# Patient Record
Sex: Male | Born: 1950
Health system: Southern US, Community
[De-identification: ages and names within clinical notes are randomized; demographics above are authoritative.]

## PROBLEM LIST (undated history)

## (undated) DIAGNOSIS — Z8619 Personal history of other infectious and parasitic diseases: Secondary | ICD-10-CM

## (undated) DIAGNOSIS — M1990 Unspecified osteoarthritis, unspecified site: Secondary | ICD-10-CM

## (undated) DIAGNOSIS — J45909 Unspecified asthma, uncomplicated: Secondary | ICD-10-CM

## (undated) DIAGNOSIS — I10 Essential (primary) hypertension: Secondary | ICD-10-CM

## (undated) HISTORY — PX: BACK SURGERY: SHX140

## (undated) HISTORY — DX: Essential (primary) hypertension: I10

---

## 2000-12-03 ENCOUNTER — Emergency Department (HOSPITAL_COMMUNITY): Admission: EM | Admit: 2000-12-03 | Discharge: 2000-12-03 | Payer: Self-pay | Admitting: Emergency Medicine

## 2000-12-03 ENCOUNTER — Encounter: Payer: Self-pay | Admitting: Emergency Medicine

## 2001-11-22 ENCOUNTER — Emergency Department (HOSPITAL_COMMUNITY): Admission: EM | Admit: 2001-11-22 | Discharge: 2001-11-22 | Payer: Self-pay | Admitting: *Deleted

## 2004-11-03 ENCOUNTER — Emergency Department (HOSPITAL_COMMUNITY): Admission: EM | Admit: 2004-11-03 | Discharge: 2004-11-03 | Payer: Self-pay | Admitting: Emergency Medicine
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
## 2007-08-18 ENCOUNTER — Emergency Department (HOSPITAL_COMMUNITY): Admission: EM | Admit: 2007-08-18 | Discharge: 2007-08-19 | Payer: Self-pay | Admitting: Emergency Medicine

## 2011-09-05 ENCOUNTER — Encounter: Payer: Self-pay | Admitting: Neurology

## 2011-09-06 NOTE — Progress Notes (Signed)
This encounter was created in error - please disregard.

## 2014-12-16 DIAGNOSIS — M5440 Lumbago with sciatica, unspecified side: Secondary | ICD-10-CM | POA: Insufficient documentation

## 2015-04-01 DIAGNOSIS — IMO0001 Reserved for inherently not codable concepts without codable children: Secondary | ICD-10-CM | POA: Insufficient documentation

## 2015-04-01 DIAGNOSIS — M5136 Other intervertebral disc degeneration, lumbar region: Secondary | ICD-10-CM | POA: Insufficient documentation

## 2015-04-01 DIAGNOSIS — M51369 Other intervertebral disc degeneration, lumbar region without mention of lumbar back pain or lower extremity pain: Secondary | ICD-10-CM | POA: Insufficient documentation

## 2015-04-01 DIAGNOSIS — M48061 Spinal stenosis, lumbar region without neurogenic claudication: Secondary | ICD-10-CM | POA: Insufficient documentation

## 2015-08-27 DIAGNOSIS — Z981 Arthrodesis status: Secondary | ICD-10-CM | POA: Insufficient documentation

## 2016-08-30 DIAGNOSIS — I10 Essential (primary) hypertension: Secondary | ICD-10-CM | POA: Insufficient documentation

## 2016-08-30 DIAGNOSIS — N400 Enlarged prostate without lower urinary tract symptoms: Secondary | ICD-10-CM | POA: Insufficient documentation

## 2016-08-30 DIAGNOSIS — K219 Gastro-esophageal reflux disease without esophagitis: Secondary | ICD-10-CM | POA: Insufficient documentation

## 2016-11-07 DIAGNOSIS — M62838 Other muscle spasm: Secondary | ICD-10-CM | POA: Insufficient documentation

## 2017-09-03 DIAGNOSIS — R269 Unspecified abnormalities of gait and mobility: Secondary | ICD-10-CM | POA: Insufficient documentation

## 2017-09-03 DIAGNOSIS — R200 Anesthesia of skin: Secondary | ICD-10-CM | POA: Insufficient documentation

## 2018-09-12 ENCOUNTER — Encounter (INDEPENDENT_AMBULATORY_CARE_PROVIDER_SITE_OTHER): Payer: Self-pay | Admitting: Orthopaedic Surgery

## 2018-09-12 ENCOUNTER — Ambulatory Visit (INDEPENDENT_AMBULATORY_CARE_PROVIDER_SITE_OTHER): Payer: Medicare Other

## 2018-09-12 ENCOUNTER — Ambulatory Visit (INDEPENDENT_AMBULATORY_CARE_PROVIDER_SITE_OTHER): Payer: Medicare Other | Admitting: Orthopaedic Surgery

## 2018-09-12 ENCOUNTER — Ambulatory Visit (INDEPENDENT_AMBULATORY_CARE_PROVIDER_SITE_OTHER): Payer: Self-pay

## 2018-09-12 VITALS — BP 155/95 | HR 65 | Resp 18 | Ht 71.0 in | Wt 290.0 lb

## 2018-09-12 DIAGNOSIS — M25559 Pain in unspecified hip: Secondary | ICD-10-CM

## 2018-09-12 DIAGNOSIS — M25561 Pain in right knee: Secondary | ICD-10-CM

## 2018-09-12 DIAGNOSIS — M25562 Pain in left knee: Secondary | ICD-10-CM

## 2018-09-12 DIAGNOSIS — G8929 Other chronic pain: Secondary | ICD-10-CM | POA: Diagnosis not present

## 2018-09-12 MED ORDER — METHYLPREDNISOLONE ACETATE 40 MG/ML IJ SUSP
80.0000 mg | INTRAMUSCULAR | Status: AC | PRN
  Administered 2018-09-12: 80 mg

## 2018-09-12 MED ORDER — LIDOCAINE HCL 1 % IJ SOLN
2.0000 mL | INTRAMUSCULAR | Status: AC | PRN
  Administered 2018-09-12: 2 mL

## 2018-09-12 MED ORDER — BUPIVACAINE HCL 0.5 % IJ SOLN
2.0000 mL | INTRAMUSCULAR | Status: AC | PRN
  Administered 2018-09-12: 2 mL via INTRA_ARTICULAR

## 2018-09-12 NOTE — Progress Notes (Signed)
Office Visit Note   Patient: Evan Morgan           Date of Birth: Jul 26, 1951           MRN: [id number] Visit Date: 09/12/2018              Requested by: No referring provider defined for this encounter. PCP: Evan Gambler, MD   Assessment & Plan: Visit Diagnoses:  1. Chronic pain of both knees   2. Arthralgia of hip, unspecified laterality     Plan: Significant problem with ambulation which I am not sure is all related to his knees.  Films of his hips were negative.  Mild arthritis in his knees.  I think a lot of the problem might be originating from his back.  He is being followed at [facility].  I will inject the right knee with cortisone and monitor response and have him return in a week.  Think he is going to need further lumbar spine evaluation at [facility]  Follow-Up Instructions: Return in about 1 week (around 09/19/2018).   Orders:  Orders Placed This Encounter  Procedures  . Large Joint Inj: R knee  . XR KNEE 3 VIEW RIGHT  . XR KNEE 3 VIEW LEFT  . XR Pelvis 1-2 Views   No orders of the defined types were placed in this encounter.     Procedures: Large Joint Inj: R knee on 09/12/2018 2:06 PM Indications: pain and diagnostic evaluation Details: 25 G 1.5 in needle, anteromedial approach  Arthrogram: No  Medications: 2 mL lidocaine 1 %; 2 mL bupivacaine 0.5 %; 80 mg methylPREDNISolone acetate 40 MG/ML Procedure, treatment alternatives, risks and benefits explained, specific risks discussed. Consent was given by the patient. Immediately prior to procedure a time out was called to verify the correct patient, procedure, equipment, support staff and site/side marked as required. Patient was prepped and draped in the usual sterile fashion.       Clinical Data: No additional findings.   Subjective: Chief Complaint  Patient presents with  . Right Knee - Pain  . Left Knee - Pain  . Lower Back - Pain  . Knee Pain    Bil knee pain x 1 1/2 years, left hurts  worse than right, stiffness, no injury, no surgery to knees, swelling, difficulty walking, difficulty sleeping at night, left knee locking, Oxycodone helps some  . Back Pain    [facility] back surgery 2016, cannot walk since surgery, right thigh pain, numbness, weakness  [name] is 67 years old and visited the office for a long history of lower extremity pain.  He does use a cane.  He finds it very difficult to ambulate any distance because of pain in his legs.  He has difficulty determining the exact location but does have chronic back pain buttock hip and knee pain.  He also has some chronic swelling of his legs.  His past history is significant that he has had prior back surgery at [facility] with "instrumentation".  He has not been seen in over a year.  He does complain of bilateral knee pain with stiffness, swelling" soreness.  He also has had some chronic swelling of both of his ankles origin".  He is not diabetic.  Does not have any upper extremity joint problems or skin rashes.  HPI  Review of Systems  Constitutional: Negative for fatigue.  HENT: Negative for trouble swallowing.   Eyes: Negative for pain.  Respiratory: Negative for shortness of  breath.   Cardiovascular: Positive for leg swelling.  Gastrointestinal: Negative for constipation.  Endocrine: Positive for heat intolerance.  Genitourinary: Positive for difficulty urinating.  Musculoskeletal: Positive for back pain, gait problem and joint swelling.  Skin: Negative for rash.  Allergic/Immunologic: Positive for food allergies.  Neurological: Positive for weakness and numbness.  Hematological: Does not bruise/bleed easily.  Psychiatric/Behavioral: Positive for sleep disturbance.     Objective: Vital Signs: BP (!) 155/95 (BP Location: Right Arm, Patient Position: Sitting, Cuff Size: Normal)   Pulse 65   Resp 18   Ht 5\' 11"  (1.803 m)   Wt 290 lb (131.5 kg)   BMI 40.45 kg/m   Physical Exam Constitutional:       Appearance: He is well-developed.  Eyes:     Pupils: Pupils are equal, round, and reactive to light.  Pulmonary:     Effort: Pulmonary effort is normal.  Skin:    General: Skin is warm and dry.  Neurological:     Mental Status: He is alert and oriented to person, place, and time.  Psychiatric:        Behavior: Behavior normal.     Ortho Exam ambulates very slowly with the use of a cane.  We actually put him in a wheelchair because he seemed to have a difficult time ambulating to the x-ray room.  He does have bilateral knee effusions and some medial lateral joint pain.  Full extension but flexes only about 95 degrees.  No instability.  Painless range of motion of both hips.  Straight leg raise negative.  Mild pitting edema of both ankles with some venous stasis changes.  Motor exam intact  Specialty Comments:  No specialty comments available.  Imaging: Xr Knee 3 View Left  Result Date: 09/12/2018 Films of the left knee were obtained in 3 projections standing.  Normal alignment.  No ectopic calcification.  Some mild subchondral sclerosis of the lateral femoral condyle may be slight cystic change in the subchondral bone.  No acute change films consistent with mild osteoarthritis  Xr Knee 3 View Right  Result Date: 09/12/2018 Films of the right knee repayment several projections standing.  The joint spaces are well-maintained.  There is about 2 degrees of varus.  No ectopic calcification.  No evidence of a fracture very minimal osteophyte formation about the patellofemoral joint.  The joint spaces well.  No acute changes.  Mild arthritis  Xr Pelvis 1-2 Views  Result Date: 09/12/2018 AP the pelvis did not demonstrate any acute changes.  Hip joints appear to be well-maintained.  No ectopic calcification.  Prior instrumentation be some sclerosis about L5-S1 facet joints on that single AP view.    PMFS History: There are no active problems to display for this patient.  Past Medical  History:  Diagnosis Date  . Hypertension     History reviewed. No pertinent family history.  Past Surgical History:  Procedure Laterality Date  . BACK SURGERY     Social History   Occupational History  . Not on file  Tobacco Use  . Smoking status: Never Smoker  . Smokeless tobacco: Never Used  Substance and Sexual Activity  . Alcohol use: Yes    Alcohol/week: 6.0 standard drinks    Types: 6 Cans of beer per week    Comment: 1/2 PINT LIQUOR WEEKLY  . Drug use: Never  . Sexual activity: Not on file

## 2018-09-20 ENCOUNTER — Ambulatory Visit (INDEPENDENT_AMBULATORY_CARE_PROVIDER_SITE_OTHER): Payer: Medicare Other | Admitting: Orthopaedic Surgery

## 2019-05-09 ENCOUNTER — Other Ambulatory Visit: Payer: Self-pay | Admitting: Orthopedic Surgery

## 2019-05-09 DIAGNOSIS — M545 Low back pain, unspecified: Secondary | ICD-10-CM

## 2019-06-07 ENCOUNTER — Other Ambulatory Visit: Payer: Self-pay

## 2019-06-07 ENCOUNTER — Ambulatory Visit
Admission: RE | Admit: 2019-06-07 | Discharge: 2019-06-07 | Disposition: A | Discharge status: Home / Self Care | Payer: Medicare Other | Source: Ambulatory Visit | Attending: Orthopedic Surgery | Admitting: Orthopedic Surgery

## 2019-06-07 DIAGNOSIS — M545 Low back pain, unspecified: Secondary | ICD-10-CM

## 2019-06-07 IMAGING — MR MR LUMBAR SPINE W/O CM
4 of 5 series · 26 of 48 positions shown · non-contrast
Comparison: None.

CLINICAL DATA: Severe low back pain

EXAM:
MRI LUMBAR SPINE WITHOUT CONTRAST
TECHNIQUE: Multiplanar, multisequence MR imaging of the lumbar spine was
performed. No intravenous contrast was administered.

[Series 3: T2 post-contrast · sagittal · 4.0mm · 0.55mm/px · 6 of 15 slices shown]
[im 1/15]
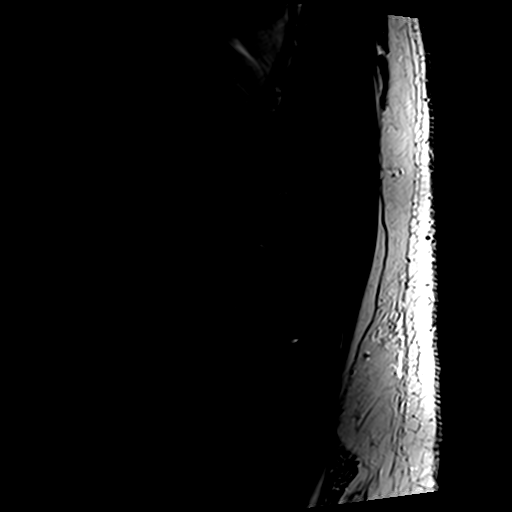
[im 3/15]
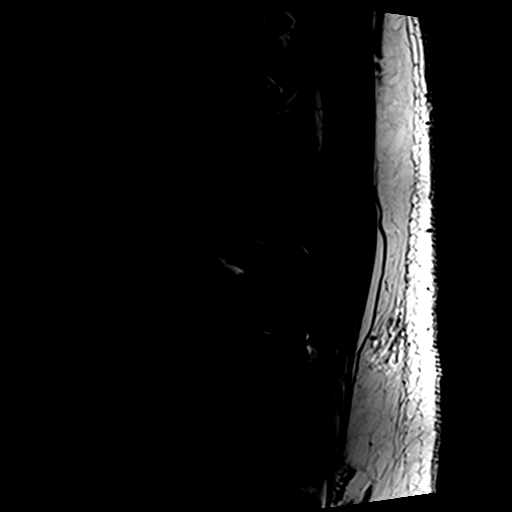
[im 6/15]
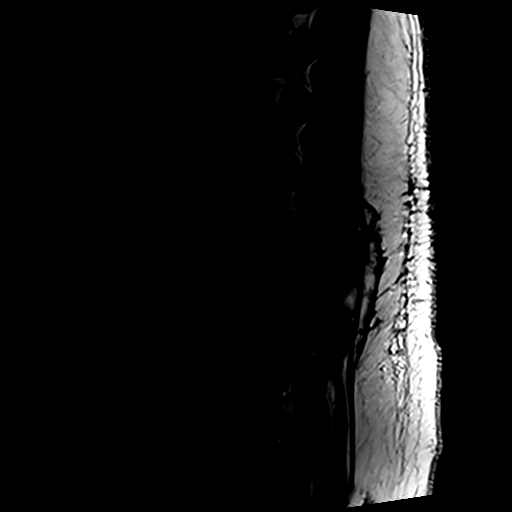
[im 9/15]
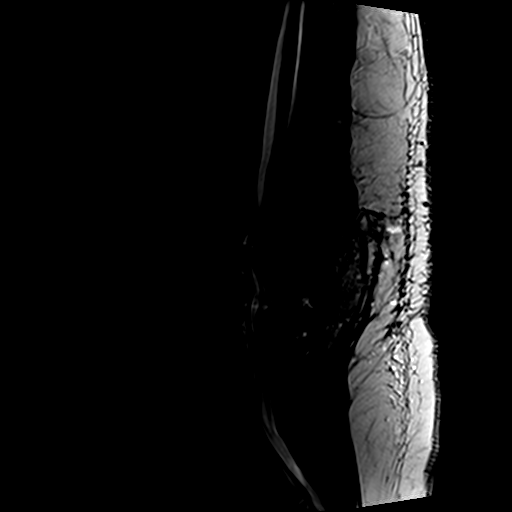
[im 12/15]
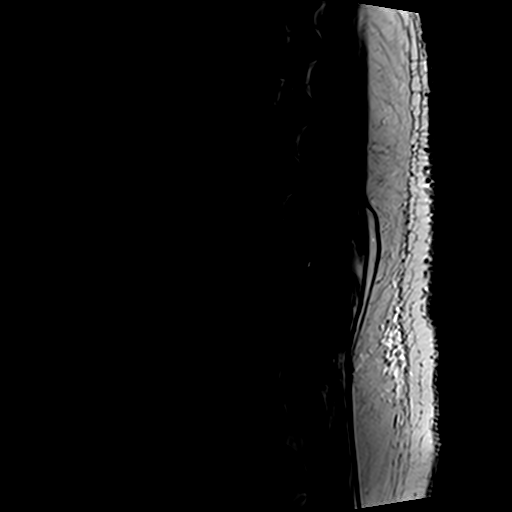
[im 15/15]
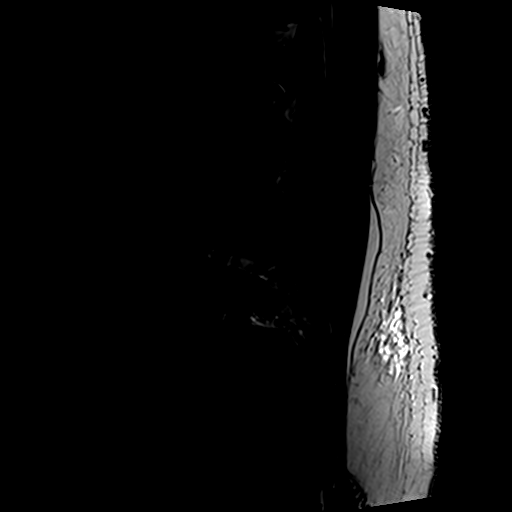

[Series 5: T1 · sagittal · 4.0mm · 0.55mm/px · 5 of 15 slices shown (1 of 2)]
[im 1/15]
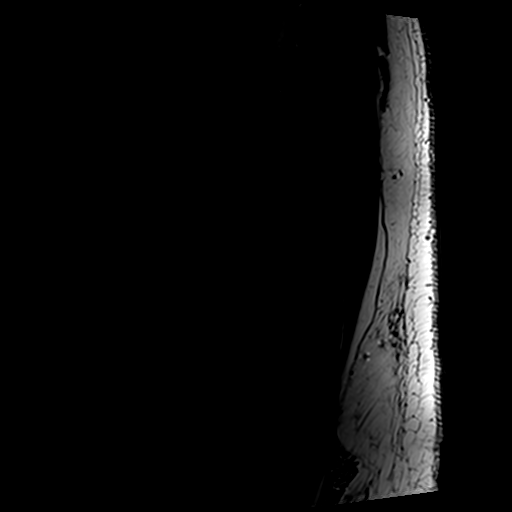
[im 4/15]
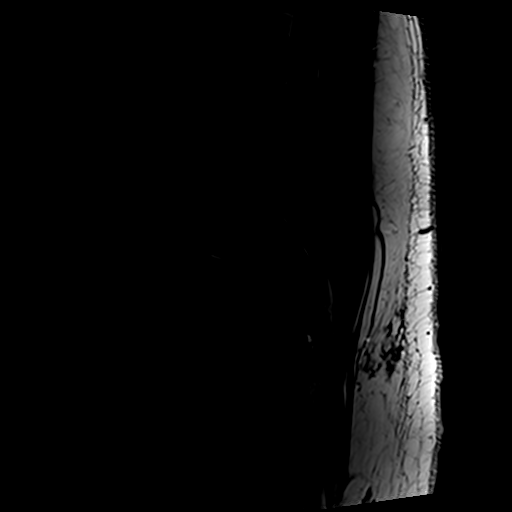
[im 8/15]
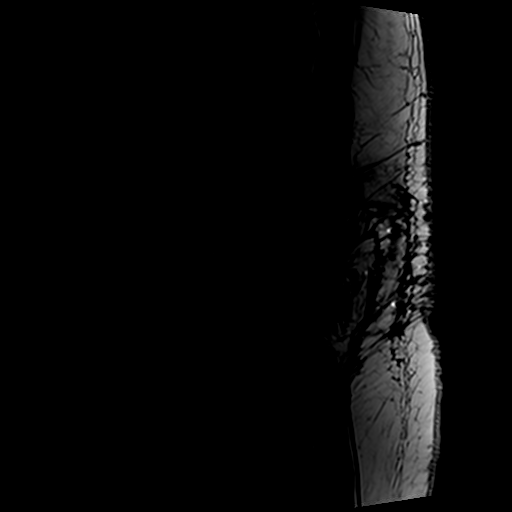
[im 11/15]
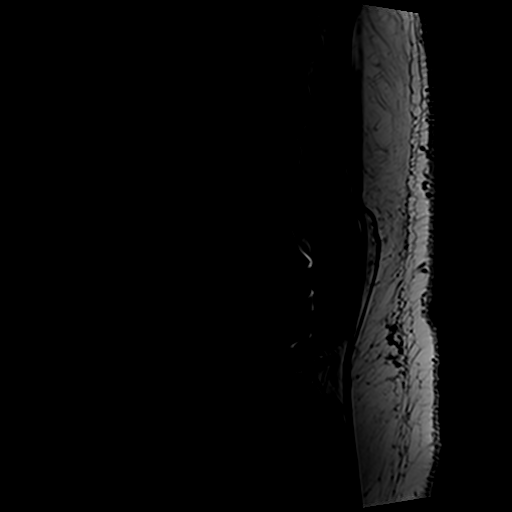
[im 15/15]
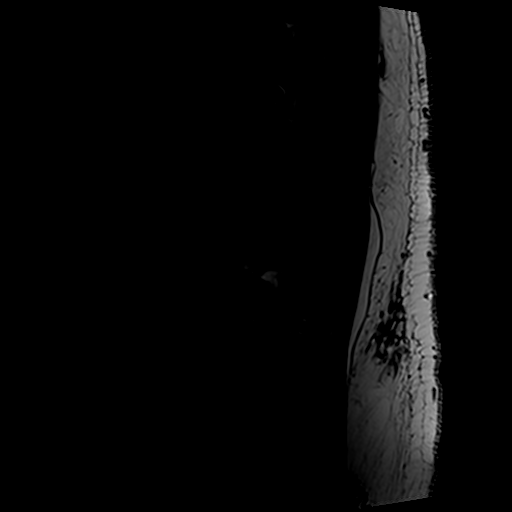

[Series 7: T2 · axial · 4.0mm · 0.70mm/px · z∈[-97,+128]mm · 10 of 46 slices shown]
[im 4/46]
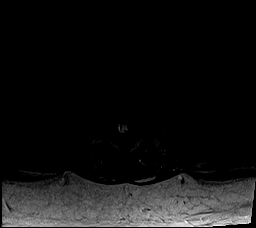
[im 7/46]
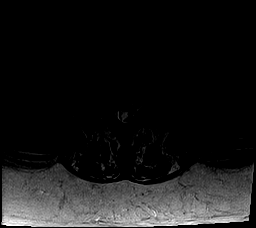
[im 10/46]
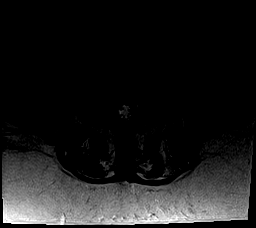
[im 16/46]
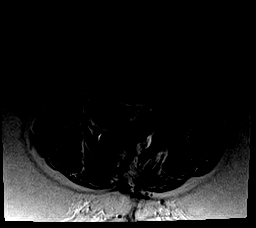
[im 22/46]
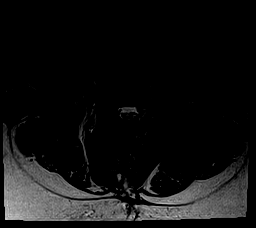
[im 25/46]
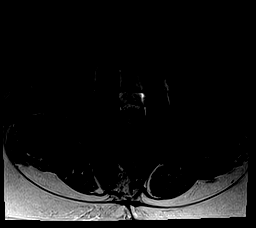
[im 28/46]
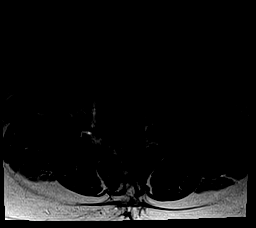
[im 34/46]
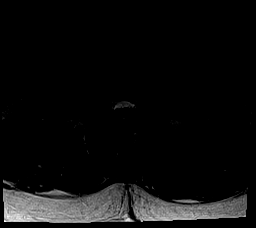
[im 40/46]
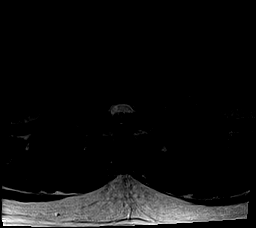
[im 46/46]
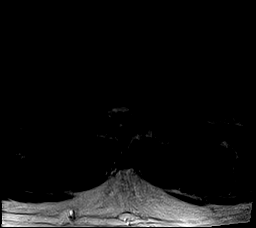

[Series 8: T1 · axial · 4.0mm · 0.35mm/px · z∈[-97,+97]mm · 5 of 46 slices shown (2 of 2)]
[im 4/46]
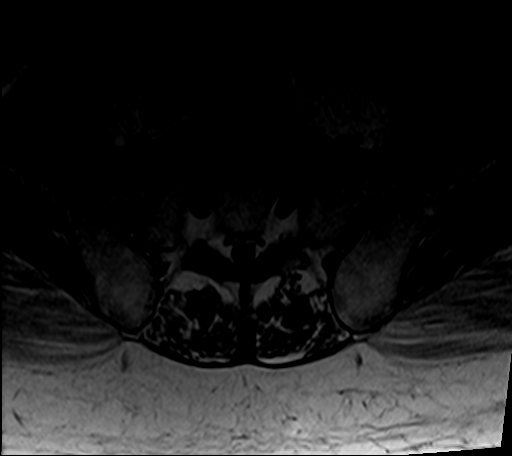
[im 7/46]
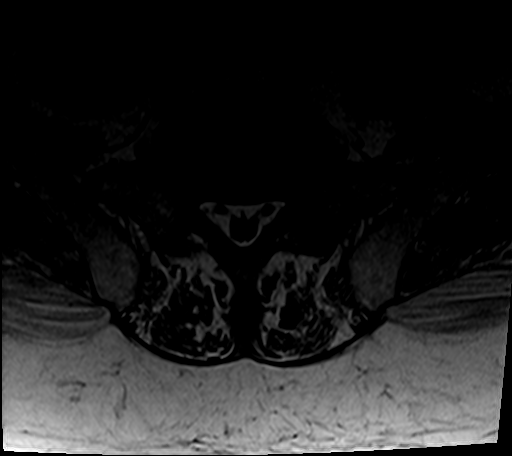
[im 10/46]
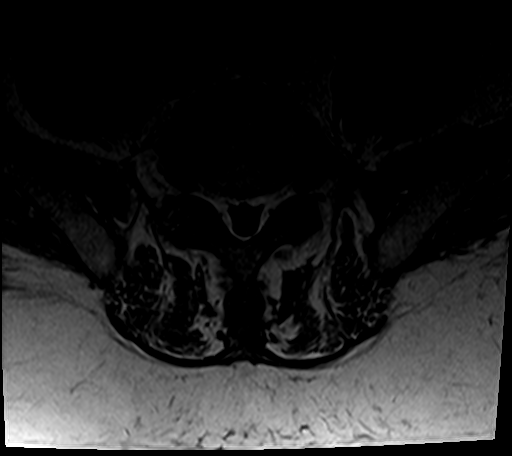
[im 25/46]
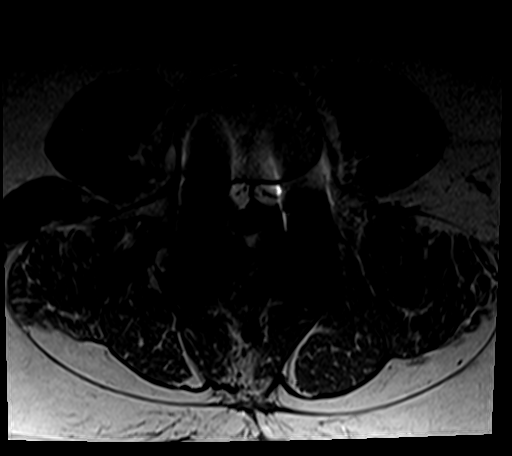
[im 40/46]
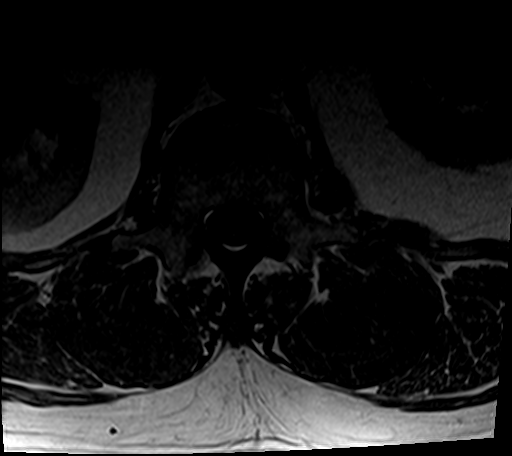

[26 of 48 positions shown; findings below may reference images not displayed]

FINDINGS: Segmentation:  Standard.

Alignment:  Grade 1 anterolisthesis L3 on L4.

Vertebrae: Susceptibility artifact from posterior fusion of L3-4
slightly degrades evaluation of the adjacent structures. No
fracture, evidence of discitis, or bone lesion.

Conus medullaris and cauda equina: Conus extends to the L1 level.
Conus and cauda equina appear normal.

Paraspinal and other soft tissues: Negative.

Disc levels:

T12-L1: No significant disc protrusion, foraminal stenosis, or canal
stenosis.

L1-L2: Minimal diffuse disc bulge and mild bilateral facet
arthrosis. No foraminal or canal stenosis.

L2-L3: Minimal diffuse disc bulge, bilateral facet arthrosis, and
ligamentum flavum buckling result in mild right foraminal stenosis.
No canal stenosis.

L3-L4: Status post fusion and posterior decompression. Extensive
susceptibility artifact at this level limits evaluation. Disc
uncovering related to grade 1 anterolisthesis. No residual canal
stenosis. There is suggestion of at least mild right foraminal
stenosis, poorly evaluated. No high-grade left foraminal stenosis.

L4-L5: Mild diffuse disc bulge with bilateral facet arthrosis result
in mild bilateral foraminal stenosis and mild canal stenosis.

L5-S1: Minimal diffuse disc bulge and mild bilateral facet arthrosis
resulting in mild bilateral foraminal stenosis. No canal stenosis.
IMPRESSION: 1. Postsurgical changes from posterior fusion and decompression at
L3-4. No canal stenosis at this level. There is at least mild right
foraminal stenosis, poorly evaluated by extensive metallic
susceptibility artifact.
2. Mild multilevel lumbar spondylosis of the non-fused levels
including mild canal and bilateral foraminal stenosis at the L4-5
level.

## 2019-06-10 ENCOUNTER — Other Ambulatory Visit: Payer: Self-pay

## 2019-06-12 ENCOUNTER — Other Ambulatory Visit: Payer: Self-pay

## 2019-07-02 ENCOUNTER — Other Ambulatory Visit: Payer: Self-pay | Admitting: Orthopedic Surgery

## 2019-07-18 NOTE — Progress Notes (Signed)
[facility] - [place] - [address] [place] [address] Phone: [phone] Fax: [phone]      Your procedure is scheduled on July 23, 2019.  Report to [facility] Main Entrance "A" at 6:30 A.M., and check in at the Admitting office.   Call this number if you have problems the morning of surgery:  [phone]  Call [phone] if you have any questions prior to your surgery date Monday-Friday 8am-4pm    Remember:  Do not eat after midnight the night before your surgery  You may drink clear liquids until 5:30AM the morning of your surgery.   Clear liquids allowed are: Water, Non-Citrus Juices (without pulp), Carbonated Beverages, Clear Tea, Black Coffee Only, and Gatorade    Please complete your PRE-SURGERY ENSURE that was provided to you by 5:30AM the morning of surgery.  Please, if able, drink it in one setting. DO NOT SIP.   Take these medicines the morning of surgery with A SIP OF WATER:  Acetaminophen (Tylenol) - if needed Atenolol (Tenormin) Finasteride (Proscar) Gabapentin (Neurontin) Hydralazine (Apresoline) Oxycodone (Percocet) - if needed Tamsulosin (Flomax)  7 days prior to surgery STOP taking any Aspirin (unless otherwise instructed by your surgeon), Aleve, Naproxen, Ibuprofen, Motrin, Advil, Goody's, BC's, all herbal medications, fish oil, and all vitamins.    The Morning of Surgery  Do not wear jewelry, make-up or nail polish.  Do not wear lotions, powders, or perfumes/colognes, or deodorant  Do not shave 48 hours prior to surgery.  Men may shave face and neck.  Do not bring valuables to the hospital.  [facility] is not responsible for any belongings or valuables.  If you are a smoker, DO NOT Smoke 24 hours prior to surgery IF you wear a CPAP at night please bring your mask, tubing, and machine the morning of surgery   Remember that you must have someone to transport you home after your surgery, and  remain with you for 24 hours if you are discharged the same day.   Contacts, glasses, hearing aids, dentures or bridgework may not be worn into surgery.    Leave your suitcase in the car.  After surgery it may be brought to your room.  For patients admitted to the hospital, discharge time will be determined by your treatment team.  Patients discharged the day of surgery will not be allowed to drive home.    Special instructions:   [place]- Preparing For Surgery  Before surgery, you can play an important role. Because skin is not sterile, your skin needs to be as free of germs as possible. You can reduce the number of germs on your skin by washing with CHG (chlorahexidine gluconate) Soap before surgery.  CHG is an antiseptic cleaner which kills germs and bonds with the skin to continue killing germs even after washing.    Oral Hygiene is also important to reduce your risk of infection.  Remember - BRUSH YOUR TEETH THE MORNING OF SURGERY WITH YOUR REGULAR TOOTHPASTE  Please do not use if you have an allergy to CHG or antibacterial soaps. If your skin becomes reddened/irritated stop using the CHG.  Do not shave (including legs and underarms) for at least 48 hours prior to first CHG shower. It is OK to shave your face.  Please follow these instructions carefully.   1. Shower the NIGHT BEFORE SURGERY and the MORNING OF SURGERY with CHG Soap.   2. If you chose to wash your hair,  wash your hair first as usual with your normal shampoo.  3. After you shampoo, rinse your hair and body thoroughly to remove the shampoo.  4. Use CHG as you would any other liquid soap. You can apply CHG directly to the skin and wash gently with a scrungie or a clean washcloth.   5. Apply the CHG Soap to your body ONLY FROM THE NECK DOWN.  Do not use on open wounds or open sores. Avoid contact with your eyes, ears, mouth and genitals (private parts). Wash Face and genitals (private parts)  with your normal  soap.   6. Wash thoroughly, paying special attention to the area where your surgery will be performed.  7. Thoroughly rinse your body with warm water from the neck down.  8. DO NOT shower/wash with your normal soap after using and rinsing off the CHG Soap.  9. Pat yourself dry with a CLEAN TOWEL.  10. Wear CLEAN PAJAMAS to bed the night before surgery, wear comfortable clothes the morning of surgery  11. Place CLEAN SHEETS on your bed the night of your first shower and DO NOT SLEEP WITH PETS.    Day of Surgery:  Do not apply any deodorants/lotions. Please shower the morning of surgery with the CHG soap  Please wear clean clothes to the hospital/surgery center.   Remember to brush your teeth WITH YOUR REGULAR TOOTHPASTE.   Please read over the following fact sheets that you were given.

## 2019-07-21 ENCOUNTER — Encounter (HOSPITAL_COMMUNITY): Payer: Self-pay

## 2019-07-21 ENCOUNTER — Encounter (HOSPITAL_COMMUNITY)
Admission: RE | Admit: 2019-07-21 | Discharge: 2019-07-21 | Disposition: A | Discharge status: Home / Self Care | Payer: Medicare Other | Source: Ambulatory Visit | Attending: Orthopedic Surgery | Admitting: Orthopedic Surgery

## 2019-07-21 ENCOUNTER — Other Ambulatory Visit: Payer: Self-pay

## 2019-07-21 ENCOUNTER — Inpatient Hospital Stay (HOSPITAL_COMMUNITY): Admission: RE | Admit: 2019-07-21 | Payer: Medicare Other | Source: Ambulatory Visit

## 2019-07-21 DIAGNOSIS — Z01818 Encounter for other preprocedural examination: Secondary | ICD-10-CM | POA: Insufficient documentation

## 2019-07-21 DIAGNOSIS — I10 Essential (primary) hypertension: Secondary | ICD-10-CM | POA: Insufficient documentation

## 2019-07-21 HISTORY — DX: Unspecified asthma, uncomplicated: J45.909

## 2019-07-21 HISTORY — DX: Personal history of other infectious and parasitic diseases: Z86.19

## 2019-07-21 LAB — URINALYSIS, ROUTINE W REFLEX MICROSCOPIC
Bilirubin Urine: NEGATIVE
Glucose, UA: NEGATIVE mg/dL
Hgb urine dipstick: NEGATIVE
Ketones, ur: NEGATIVE mg/dL
Leukocytes,Ua: NEGATIVE
Nitrite: NEGATIVE
Protein, ur: NEGATIVE mg/dL
Specific Gravity, Urine: 1.018 (ref 1.005–1.030)
pH: 5 (ref 5.0–8.0)

## 2019-07-21 LAB — TYPE AND SCREEN
ABO/RH(D): A POS
Antibody Screen: NEGATIVE

## 2019-07-21 LAB — COMPREHENSIVE METABOLIC PANEL
ALT: 17 U/L (ref 0–44)
AST: 23 U/L (ref 15–41)
Albumin: 3.7 g/dL (ref 3.5–5.0)
Alkaline Phosphatase: 30 U/L — ABNORMAL LOW (ref 38–126)
Anion gap: 9 (ref 5–15)
BUN: 13 mg/dL (ref 8–23)
CO2: 26 mmol/L (ref 22–32)
Calcium: 9.3 mg/dL (ref 8.9–10.3)
Chloride: 99 mmol/L (ref 98–111)
Creatinine, Ser: 1.15 mg/dL (ref 0.61–1.24)
GFR calc Af Amer: >60 mL/min (ref >60)
GFR calc non Af Amer: >60 mL/min (ref >60)
Glucose, Bld: 122 mg/dL — ABNORMAL HIGH (ref 70–99)
Potassium: 3.5 mmol/L (ref 3.5–5.1)
Sodium: 134 mmol/L — ABNORMAL LOW (ref 135–145)
Total Bilirubin: 0.9 mg/dL (ref 0.3–1.2)
Total Protein: 7.5 g/dL (ref 6.5–8.1)

## 2019-07-21 LAB — CBC WITH DIFFERENTIAL/PLATELET
Abs Immature Granulocytes: 0.01 10*3/uL (ref 0.00–0.07)
Basophils Absolute: 0 10*3/uL (ref 0.0–0.1)
Basophils Relative: 1 %
Eosinophils Absolute: 0.1 10*3/uL (ref 0.0–0.5)
Eosinophils Relative: 3 %
HCT: 45.8 % (ref 39.0–52.0)
Hemoglobin: 15.8 g/dL (ref 13.0–17.0)
Immature Granulocytes: 0 %
Lymphocytes Relative: 52 %
Lymphs Abs: 2 10*3/uL (ref 0.7–4.0)
MCH: 32.1 pg (ref 26.0–34.0)
MCHC: 34.5 g/dL (ref 30.0–36.0)
MCV: 93.1 fL (ref 80.0–100.0)
Monocytes Absolute: 0.5 10*3/uL (ref 0.1–1.0)
Monocytes Relative: 12 %
Neutro Abs: 1.2 10*3/uL — ABNORMAL LOW (ref 1.7–7.7)
Neutrophils Relative %: 32 %
Platelets: 97 10*3/uL — ABNORMAL LOW (ref 150–400)
RBC: 4.92 MIL/uL (ref 4.22–5.81)
RDW: 12.8 % (ref 11.5–15.5)
WBC: 3.8 10*3/uL — ABNORMAL LOW (ref 4.0–10.5)
nRBC: 0 % (ref 0.0–0.2)

## 2019-07-21 LAB — ABO/RH: ABO/RH(D): A POS

## 2019-07-21 LAB — SURGICAL PCR SCREEN
MRSA, PCR: NEGATIVE
Staphylococcus aureus: POSITIVE — AB

## 2019-07-21 LAB — PROTIME-INR
INR: 1.1 (ref 0.8–1.2)
Prothrombin Time: 14.4 seconds (ref 11.4–15.2)

## 2019-07-21 LAB — APTT: aPTT: 37 seconds — ABNORMAL HIGH (ref 24–36)

## 2019-07-21 NOTE — Progress Notes (Signed)
[facility] - [place] - [address] [place] [address] Phone: [phone] Fax: [phone]      Your procedure is scheduled on July 23, 2019.  Report to [facility] Main Entrance "A" at 6:30 A.M., and check in at the Admitting office.   Call this number if you have problems the morning of surgery:  [phone]  Call [phone] if you have any questions prior to your surgery date Monday-Friday 8am-4pm    Remember:  Do not eat after midnight the night before your surgery  You may drink clear liquids until 5:30AM the morning of your surgery.   Clear liquids allowed are: Water, Non-Citrus Juices (without pulp), Carbonated Beverages, Clear Tea, Black Coffee Only, and Gatorade     Enhanced Recovery after Surgery for Orthopedics Enhanced Recovery after Surgery is a protocol used to improve the stress on your body and your recovery after surgery.  Patient Instructions  . The night before surgery:  o No food after midnight. ONLY clear liquids after midnight  .  Marland Kitchen The day of surgery (if you do NOT have diabetes):  o Drink ONE (1) Pre-Surgery Clear Ensure as directed.   o This drink was given to you during your hospital  pre-op appointment visit. o The pre-op nurse will instruct you on the time to drink the  Pre-Surgery Ensure depending on your surgery time. o Finish the drink at the designated time by the pre-op nurse.  o Nothing else to drink after completing the  Pre-Surgery Clear Ensure.          If you have questions, please contact your surgeon's office.    Take these medicines the morning of surgery with A SIP OF WATER:  Acetaminophen (Tylenol) - if needed Atenolol (Tenormin) Finasteride (Proscar) Gabapentin (Neurontin) Hydralazine (Apresoline) Oxycodone (Percocet) - if needed Tamsulosin (Flomax)  7 days prior to surgery STOP taking any Aspirin (unless otherwise instructed by your surgeon), Aleve, Naproxen,  Ibuprofen, Motrin, Advil, Goody's, BC's, all herbal medications, fish oil, and all vitamins.     The Morning of Surgery  Do not wear jewelry, make-up or nail polish.  Do not wear lotions, powders, or perfumes/colognes, or deodorant  Do not shave 48 hours prior to surgery.  Men may shave face and neck.  Do not bring valuables to the hospital.  [facility] is not responsible for any belongings or valuables.  If you are a smoker, DO NOT Smoke 24 hours prior to surgery IF you wear a CPAP at night please bring your mask, tubing, and machine the morning of surgery   Remember that you must have someone to transport you home after your surgery, and remain with you for 24 hours if you are discharged the same day.   Contacts, glasses, hearing aids, dentures or bridgework may not be worn into surgery.    Leave your suitcase in the car.  After surgery it may be brought to your room.  For patients admitted to the hospital, discharge time will be determined by your treatment team.  Patients discharged the day of surgery will not be allowed to drive home.    Special instructions:   [place]- Preparing For Surgery  Before surgery, you can play an important role. Because skin is not sterile, your skin needs to be as free of germs as possible. You can reduce the number of germs on your skin by washing with CHG (chlorahexidine gluconate) Soap before surgery.  CHG is an antiseptic  cleaner which kills germs and bonds with the skin to continue killing germs even after washing.    Oral Hygiene is also important to reduce your risk of infection.  Remember - BRUSH YOUR TEETH THE MORNING OF SURGERY WITH YOUR REGULAR TOOTHPASTE  Please do not use if you have an allergy to CHG or antibacterial soaps. If your skin becomes reddened/irritated stop using the CHG.  Do not shave (including legs and underarms) for at least 48 hours prior to first CHG shower. It is OK to shave your face.  Please follow these  instructions carefully.   1. Shower the NIGHT BEFORE SURGERY and the MORNING OF SURGERY with CHG Soap.   2. If you chose to wash your hair, wash your hair first as usual with your normal shampoo.  3. After you shampoo, rinse your hair and body thoroughly to remove the shampoo.  4. Use CHG as you would any other liquid soap. You can apply CHG directly to the skin and wash gently with a scrungie or a clean washcloth.   5. Apply the CHG Soap to your body ONLY FROM THE NECK DOWN.  Do not use on open wounds or open sores. Avoid contact with your eyes, ears, mouth and genitals (private parts). Wash Face and genitals (private parts)  with your normal soap.   6. Wash thoroughly, paying special attention to the area where your surgery will be performed.  7. Thoroughly rinse your body with warm water from the neck down.  8. DO NOT shower/wash with your normal soap after using and rinsing off the CHG Soap.  9. Pat yourself dry with a CLEAN TOWEL.  10. Wear CLEAN PAJAMAS to bed the night before surgery, wear comfortable clothes the morning of surgery  11. Place CLEAN SHEETS on your bed the night of your first shower and DO NOT SLEEP WITH PETS.    Day of Surgery:  Do not apply any deodorants/lotions. Please shower the morning of surgery with the CHG soap  Please wear clean clothes to the hospital/surgery center.   Remember to brush your teeth WITH YOUR REGULAR TOOTHPASTE.   Please read over the following fact sheets that you were given.

## 2019-07-21 NOTE — Progress Notes (Signed)
Denies chest pain or shob. Denies having a cardiologist. Reports that the [place] in [place] him scheduled for some type of cardiac test "just to check on things." Release form signed and requested records from [place]. Will send to anesthesia for review.

## 2019-07-22 ENCOUNTER — Other Ambulatory Visit (HOSPITAL_COMMUNITY)
Admission: RE | Admit: 2019-07-22 | Discharge: 2019-07-22 | Disposition: A | Discharge status: Home / Self Care | Payer: Medicare Other | Source: Ambulatory Visit | Attending: Orthopedic Surgery | Admitting: Orthopedic Surgery

## 2019-07-22 LAB — SARS CORONAVIRUS 2 (TAT 6-24 HRS): SARS Coronavirus 2: NEGATIVE

## 2019-07-22 MED ORDER — DEXTROSE 5 % IV SOLN
3.0000 g | INTRAVENOUS | Status: AC
  Administered 2019-07-23: 3 g via INTRAVENOUS
  Filled 2019-07-22: qty 3

## 2019-07-22 NOTE — Progress Notes (Signed)
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  He is also scheduled for L2-4 posterior spinal fusion revision on 07/24/2019.  History includes never smoker, HTN, childhood asthma, Hepatitis C (s/p treatment). S/p L3-4 transforaminal lumbar interbody fusion 07/21/15. S/p L3-4 redo decompression and fusion with posterolateral fusion and revision of hardware 09/06/16.  I contacted [facility] Release of Information around 9:30 AM this morning for follow-up records request faxed on 07/21/19 with signed Release of Information. Last visit with primary care was in June 2020. No cardiology notes. No pending tests seen per medical records staff. Last ECG there was from 2011. Staff to fax records, but not received as of 3:30 PM. I could not get an answer when I tried to contact them again. I was able to get speak with Dr. Garner Nash nurse Apolinar Junes. He noted last visit 03/07/19 was for ED follow-up for edema without associated SOB or chest pain. (I was able to view 03/02/19 ED note in [facility] Care Everywhere). LE Duplex negative for DVT, and.BNP WNL. Patient was more concerned that swelling was related to his previous back surgeries and chronic BLE shooting pains. Since without overt HF symptoms and already on HCTZ, ED referred him back to PCP.  Dr. Jess Barters recommended he continue compression stockings, but also ordered TTE (which is not scheduled into November 2020). Continued Neurosurgery follow-up for chronic back pain.   Reviewed case with anesthesiologist Arta Bruce, MD. Patient with chronic edema ,denied SOB or chest  pain. Normal BNP in June. No diabetes or known CAD history. If no concerning CV/HF symptoms then would anticipate he can proceed with surgery as scheduled, but definitive plan per assigned anesthesiologist following day of surgery evaluation. I did call and speak with Mr. Odoherty. He confirms he is without chest pain and SOB. Says LE is stable--says some days legs don't seem swollen at all, but others feet will be swollen but not so severe that he has issues with shoes fitting. He is using compression stockings which seem to help, as well as, elevation. He is on HCTZ. No orthopnea. He admits that he is not particularly active for the past six month due to his back/leg pain, but prior to that did not feel activity was limited, although did walk with a cane.     PLT count 97K and PTT 37 which were called to Lupita Leash at Dr. Marshell Levan office. He has had mild thrombocytopenia in the past (~ 120-130K)--he does have a history of hepatitis C, but no liver imaging reports currently available. From an anesthesia standpoint, labs appear acceptable. COVID-19 test is in process.     VS: BP 118/63   Pulse 64   Temp (!) 36.3 C   Resp 20   Ht 5\' 11"  (1.803 m)   SpO2 97%   BMI 40.45 kg/m   PROVIDERS: , MD is PCP [facility], [phone], ext. [phone])   LABS: Preoperative labs noted. Cr 1.15. AST/ALT and PT/INR WNL. PTT 37. PLT count 97K (). WBC 3.8. Glucose 122. T&S done.  (all labs ordered are listed, but only abnormal results are displayed)  Labs Reviewed  SURGICAL PCR SCREEN - Abnormal; Notable for the following components:      Result Value   Staphylococcus aureus POSITIVE (*)    All other components within normal limits  APTT - Abnormal; Notable for the following components:   aPTT 37 (*)    All other components within normal limits  CBC WITH DIFFERENTIAL/PLATELET - Abnormal; Notable for the following components:   WBC 3.8 (*)    Platelets 97 (*)    Neutro Abs 1.2 (*)     All other components within normal limits  COMPREHENSIVE METABOLIC PANEL - Abnormal; Notable for the following components:   Sodium 134 (*)    Glucose, Bld 122 (*)    Alkaline Phosphatase 30 (*)    All other components within normal limits  PROTIME-INR  URINALYSIS, ROUTINE W REFLEX MICROSCOPIC  TYPE AND SCREEN  ABO/RH     IMAGES: MRI L-spine 06/07/19: IMPRESSION: 1. Postsurgical changes from posterior fusion and decompression at L3-4. No canal stenosis at this level. There is at least mild right foraminal stenosis, poorly evaluated by extensive metallic susceptibility artifact. 2. Mild multilevel lumbar spondylosis of the non-fused levels including mild canal and bilateral foraminal stenosis at the L4-5 level.  CT L-spine 04/08/19 [facility]): 1. No evidence of acute fracture or traumatic malalignment involving the lumbar spine. 2. Redemonstrated L3-L4 PLIF. No solid osseous bridging across the L3-L4 disc space. Mild lucency surrounding the left L3 pedicle screw. No interval hardware complications. 3. Advanced right greater than left foraminal stenosis at L3-4. Can consider MRI to further evaluate nerve roots if clinically indicated. 4. Degenerative changes resulting in mild to moderate canal stenosis at L2-L3, and moderate to advanced canal stenosis at L4-L5 do not appear significantly changed relative to prior studies.   EKG: 07/21/19: Normal sinus rhythm Inferior infarct , age undetermined Abnormal ECG - Currently, no comparison EKG available. 2011 EKG requested from [facility], but not yet received.    CV: BLE venous Duplex 03/03/19 [facility]): No evidence of deep vein thrombosis. The peroneal vein was not visualized on the right. Calf veins were not visualized on the left.    Past Medical History:  Diagnosis Date  . Asthma    As a child  . History of hepatitis C   . Hypertension     Past Surgical History:  Procedure Laterality Date  . BACK SURGERY       MEDICATIONS: . acetaminophen (TYLENOL) 500 MG tablet  . atenolol (TENORMIN) 50 MG tablet  . Cholecalciferol (VITAMIN D-1000 MAX ST) 25 MCG (1000 UT) tablet  . finasteride (PROSCAR) 5 MG tablet  . gabapentin (NEURONTIN) 300 MG capsule  . hydrALAZINE (APRESOLINE) 10 MG tablet  . hydrochlorothiazide (HYDRODIURIL) 25 MG tablet  . lidocaine (LMX) 4 % cream  . oxyCODONE-acetaminophen (PERCOCET) 10-325 MG tablet  . tamsulosin (FLOMAX) 0.4 MG CAPS capsule   No current facility-administered medications for this encounter.     Myra Gianotti, PA-C Surgical Short Stay/Anesthesiology [facility] Phone [phone] [facility] Phone [phone] 07/22/2019 4:00 PM

## 2019-07-22 NOTE — Anesthesia Preprocedure Evaluation (Addendum)
Anesthesia Evaluation  Patient identified by MRN, date of birth, ID band Patient awake    Reviewed: Allergy & Precautions, NPO status , Patient's Chart, lab work & pertinent test results  Airway Mallampati: II  TM Distance: >3 FB     Dental   Pulmonary    breath sounds clear to auscultation       Cardiovascular hypertension,  Rhythm:Regular Rate:Normal     Neuro/Psych    GI/Hepatic negative GI ROS, (+) Hepatitis -  Endo/Other  negative endocrine ROS  Renal/GU negative Renal ROS     Musculoskeletal   Abdominal   Peds  Hematology   Anesthesia Other Findings   Reproductive/Obstetrics                           Anesthesia Physical Anesthesia Plan  ASA: III  Anesthesia Plan: General   Post-op Pain Management:    Induction: Intravenous  PONV Risk Score and Plan: 2 and Ondansetron, Dexamethasone and Midazolam  Airway Management Planned: Oral ETT  Additional Equipment:   Intra-op Plan:   Post-operative Plan: Possible Post-op intubation/ventilation  Informed Consent:     Dental advisory given  Plan Discussed with: Anesthesiologist and CRNA  Anesthesia Plan Comments: (See PAT note written 07/22/2019 by Myra Gianotti, PA-C. )      Anesthesia Quick Evaluation

## 2019-07-23 ENCOUNTER — Inpatient Hospital Stay (HOSPITAL_COMMUNITY): Payer: Medicare Other | Admitting: Vascular Surgery

## 2019-07-23 ENCOUNTER — Other Ambulatory Visit: Payer: Self-pay

## 2019-07-23 ENCOUNTER — Inpatient Hospital Stay (HOSPITAL_COMMUNITY): Payer: Medicare Other | Admitting: Anesthesiology

## 2019-07-23 ENCOUNTER — Inpatient Hospital Stay (HOSPITAL_COMMUNITY): Payer: Medicare Other

## 2019-07-23 ENCOUNTER — Encounter (HOSPITAL_COMMUNITY)
Admission: RE | Disposition: A | Discharge status: Home / Self Care | Payer: Self-pay | Source: Home / Self Care | Attending: Orthopedic Surgery

## 2019-07-23 ENCOUNTER — Inpatient Hospital Stay (HOSPITAL_COMMUNITY)
Admission: RE | Admit: 2019-07-23 | Discharge: 2019-07-25 | DRG: 454 | Disposition: A | Discharge status: Home Health | Payer: Medicare Other | Attending: Orthopedic Surgery | Admitting: Orthopedic Surgery

## 2019-07-23 ENCOUNTER — Encounter (HOSPITAL_COMMUNITY): Payer: Self-pay | Admitting: *Deleted

## 2019-07-23 DIAGNOSIS — Y834 Other reconstructive surgery as the cause of abnormal reaction of the patient, or of later complication, without mention of misadventure at the time of the procedure: Secondary | ICD-10-CM | POA: Diagnosis present

## 2019-07-23 DIAGNOSIS — Z885 Allergy status to narcotic agent status: Secondary | ICD-10-CM

## 2019-07-23 DIAGNOSIS — T84226A Displacement of internal fixation device of vertebrae, initial encounter: Secondary | ICD-10-CM | POA: Diagnosis present

## 2019-07-23 DIAGNOSIS — M48061 Spinal stenosis, lumbar region without neurogenic claudication: Secondary | ICD-10-CM | POA: Diagnosis present

## 2019-07-23 DIAGNOSIS — Y793 Surgical instruments, materials and orthopedic devices (including sutures) associated with adverse incidents: Secondary | ICD-10-CM | POA: Diagnosis present

## 2019-07-23 DIAGNOSIS — Z419 Encounter for procedure for purposes other than remedying health state, unspecified: Secondary | ICD-10-CM

## 2019-07-23 DIAGNOSIS — M4316 Spondylolisthesis, lumbar region: Secondary | ICD-10-CM | POA: Diagnosis present

## 2019-07-23 DIAGNOSIS — Z8619 Personal history of other infectious and parasitic diseases: Secondary | ICD-10-CM

## 2019-07-23 DIAGNOSIS — M5416 Radiculopathy, lumbar region: Secondary | ICD-10-CM | POA: Diagnosis present

## 2019-07-23 DIAGNOSIS — J45909 Unspecified asthma, uncomplicated: Secondary | ICD-10-CM | POA: Diagnosis present

## 2019-07-23 DIAGNOSIS — M960 Pseudarthrosis after fusion or arthrodesis: Secondary | ICD-10-CM | POA: Diagnosis present

## 2019-07-23 DIAGNOSIS — M5410 Radiculopathy, site unspecified: Secondary | ICD-10-CM | POA: Diagnosis present

## 2019-07-23 DIAGNOSIS — Z20828 Contact with and (suspected) exposure to other viral communicable diseases: Secondary | ICD-10-CM | POA: Diagnosis present

## 2019-07-23 DIAGNOSIS — Z888 Allergy status to other drugs, medicaments and biological substances status: Secondary | ICD-10-CM

## 2019-07-23 DIAGNOSIS — I10 Essential (primary) hypertension: Secondary | ICD-10-CM | POA: Diagnosis present

## 2019-07-23 HISTORY — PX: ANTERIOR LAT LUMBAR FUSION: SHX1168

## 2019-07-23 IMAGING — RF DG C-ARM 1-60 MIN
1 series · 2 of 2 positions shown · non-contrast
Comparison: MRI lumbar spine [DATE]

CLINICAL DATA: L2-3 XLIF.

EXAM:
LUMBAR SPINE - 2-3 VIEW; DG C-ARM 1-60 MIN

[Series 1: run · 2 of 2 slices shown]
[im 1/2]
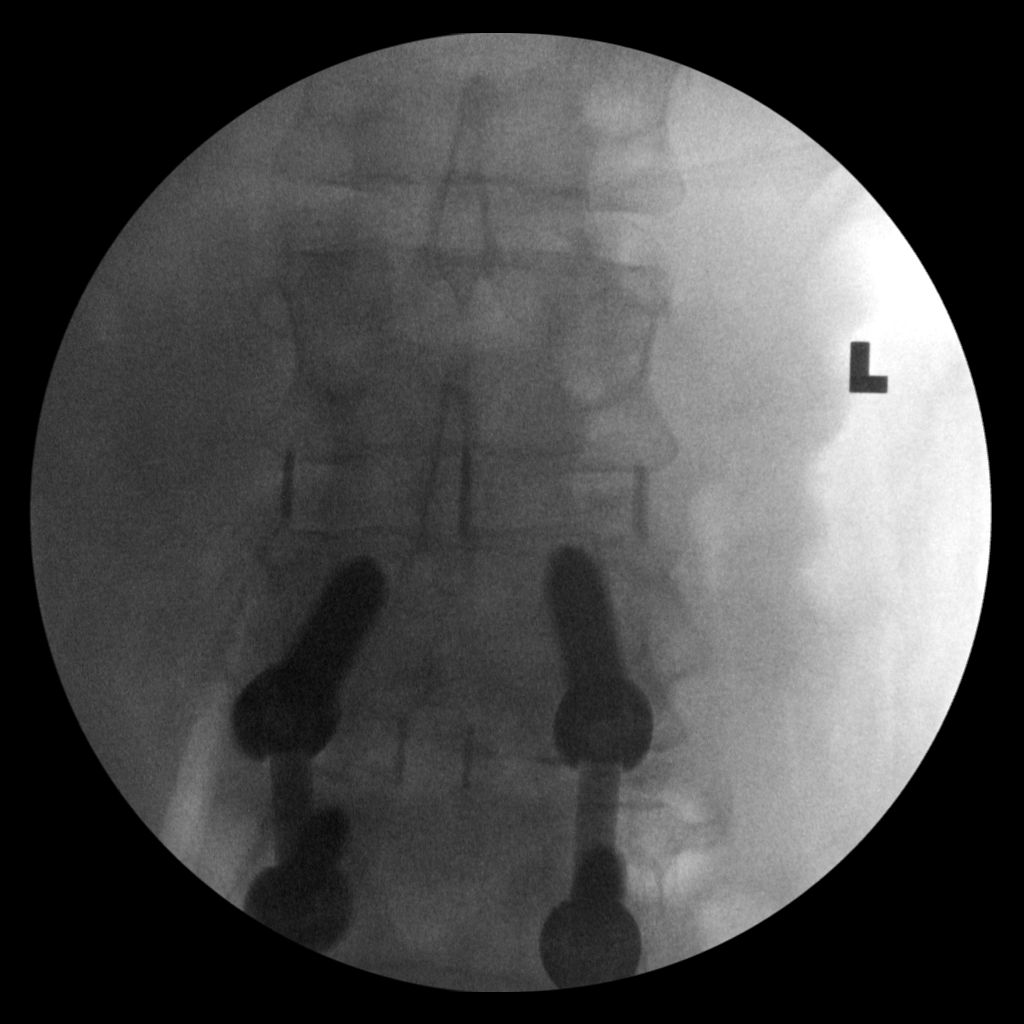
[im 2/2]
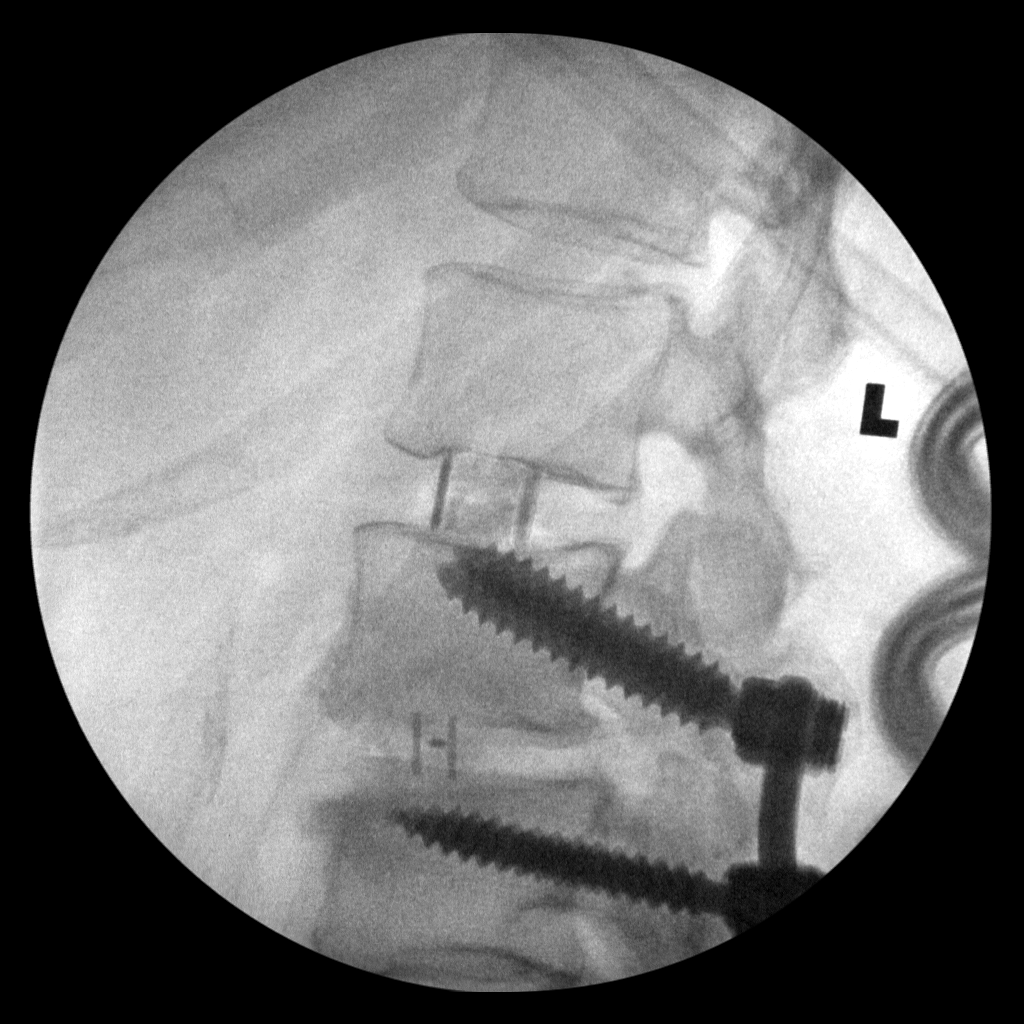

[2 of 2 positions shown; findings below may reference images not displayed]

FINDINGS: Previous lumbar fusion at L3-4 is stable.

Knee discectomy and spacer is present at the L2-3 level. Alignment
is anatomic. Disc height is improved.
IMPRESSION: Interval discectomy at L2-3 without radiographic evidence for
complication.

## 2019-07-23 IMAGING — RF DG LUMBAR SPINE 2-3V
1 series · 2 of 2 positions shown · non-contrast
Comparison: MRI lumbar spine [DATE]

CLINICAL DATA: L2-3 XLIF.

EXAM:
LUMBAR SPINE - 2-3 VIEW; DG C-ARM 1-60 MIN

[Series 1: run · 2 of 2 slices shown]
[im 1/2]
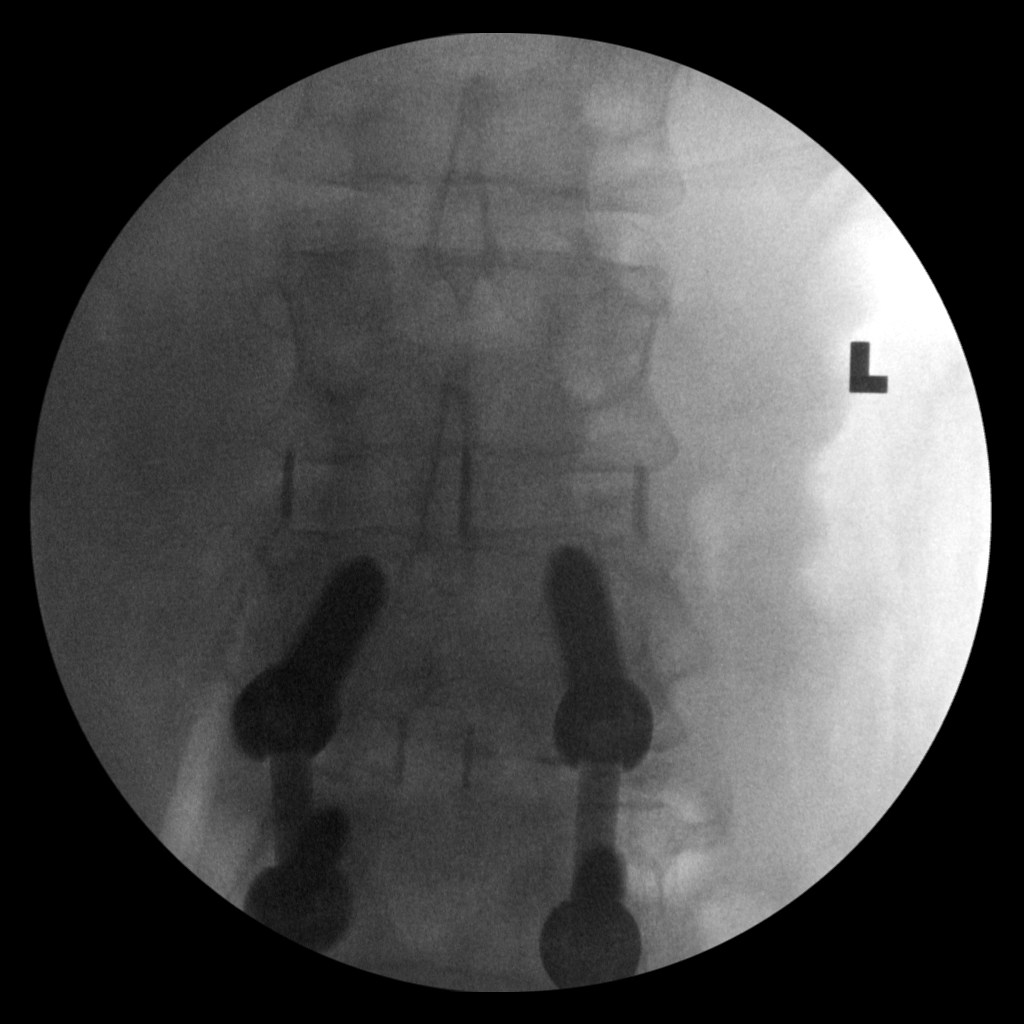
[im 2/2]
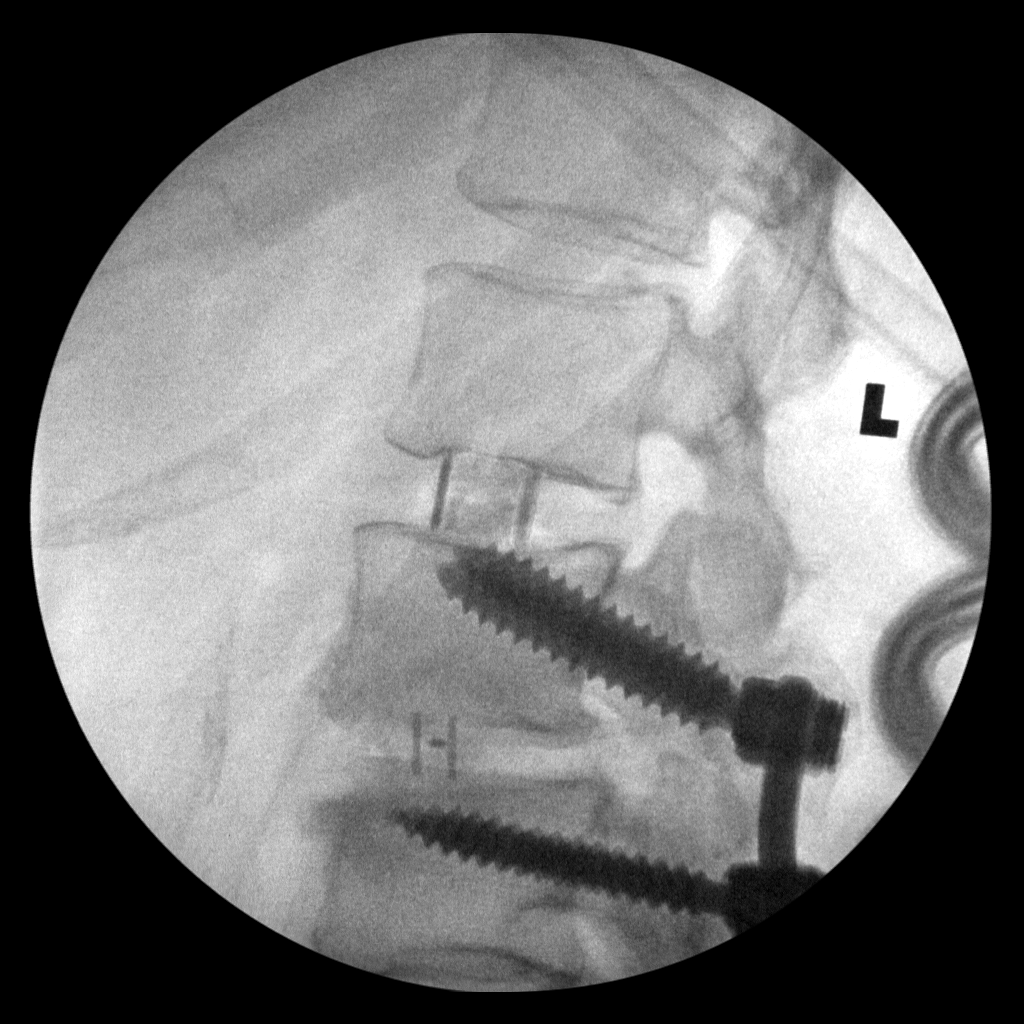

[2 of 2 positions shown; findings below may reference images not displayed]

FINDINGS: Previous lumbar fusion at L3-4 is stable.

Knee discectomy and spacer is present at the L2-3 level. Alignment
is anatomic. Disc height is improved.
IMPRESSION: Interval discectomy at L2-3 without radiographic evidence for
complication.

## 2019-07-23 SURGERY — ANTERIOR LATERAL LUMBAR FUSION 1 LEVEL
Anesthesia: General | Laterality: Left

## 2019-07-23 MED ORDER — DOCUSATE SODIUM 100 MG PO CAPS
100.0000 mg | ORAL_CAPSULE | Freq: Two times a day (BID) | ORAL | Status: DC
  Administered 2019-07-23 – 2019-07-25 (×3): 100 mg via ORAL
  Filled 2019-07-23 (×3): qty 1

## 2019-07-23 MED ORDER — DIAZEPAM 5 MG PO TABS
ORAL_TABLET | ORAL | Status: AC
  Filled 2019-07-23: qty 1

## 2019-07-23 MED ORDER — GABAPENTIN 300 MG PO CAPS
900.0000 mg | ORAL_CAPSULE | Freq: Two times a day (BID) | ORAL | Status: DC
  Administered 2019-07-23 – 2019-07-25 (×4): 900 mg via ORAL
  Filled 2019-07-23 (×5): qty 3

## 2019-07-23 MED ORDER — METHYLENE BLUE 0.5 % INJ SOLN
INTRAVENOUS | Status: AC
  Filled 2019-07-23: qty 10

## 2019-07-23 MED ORDER — SENNOSIDES-DOCUSATE SODIUM 8.6-50 MG PO TABS: 1.0000 | ORAL_TABLET | Freq: Every evening | ORAL | Status: DC | PRN

## 2019-07-23 MED ORDER — BISACODYL 5 MG PO TBEC: 5.0000 mg | DELAYED_RELEASE_TABLET | Freq: Every day | ORAL | Status: DC | PRN

## 2019-07-23 MED ORDER — MENTHOL 3 MG MT LOZG: 1.0000 | LOZENGE | OROMUCOSAL | Status: DC | PRN

## 2019-07-23 MED ORDER — PROPOFOL 10 MG/ML IV BOLUS
INTRAVENOUS | Status: AC
  Filled 2019-07-23: qty 20

## 2019-07-23 MED ORDER — FINASTERIDE 5 MG PO TABS
5.0000 mg | ORAL_TABLET | Freq: Every day | ORAL | Status: DC
  Administered 2019-07-24 – 2019-07-25 (×2): 5 mg via ORAL
  Filled 2019-07-23 (×2): qty 1

## 2019-07-23 MED ORDER — THROMBIN 20000 UNITS EX SOLR
CUTANEOUS | Status: DC | PRN
  Administered 2019-07-23: 10:00:00 20000 [IU] via TOPICAL

## 2019-07-23 MED ORDER — 0.9 % SODIUM CHLORIDE (POUR BTL) OPTIME
TOPICAL | Status: DC | PRN
  Administered 2019-07-23 (×2): 1000 mL

## 2019-07-23 MED ORDER — SODIUM CHLORIDE 0.9 % IV SOLN
INTRAVENOUS | Status: DC | PRN
  Administered 2019-07-23: 25 ug/min via INTRAVENOUS

## 2019-07-23 MED ORDER — DEXAMETHASONE SODIUM PHOSPHATE 10 MG/ML IJ SOLN
INTRAMUSCULAR | Status: AC
  Filled 2019-07-23: qty 1

## 2019-07-23 MED ORDER — FENTANYL CITRATE (PF) 100 MCG/2ML IJ SOLN
INTRAMUSCULAR | Status: AC
  Filled 2019-07-23: qty 2

## 2019-07-23 MED ORDER — ROCURONIUM BROMIDE 10 MG/ML (PF) SYRINGE
PREFILLED_SYRINGE | INTRAVENOUS | Status: AC
  Filled 2019-07-23: qty 10

## 2019-07-23 MED ORDER — MIDAZOLAM HCL 2 MG/2ML IJ SOLN
INTRAMUSCULAR | Status: DC | PRN
  Administered 2019-07-23: 2 mg via INTRAVENOUS

## 2019-07-23 MED ORDER — ONDANSETRON HCL 4 MG PO TABS: 4.0000 mg | ORAL_TABLET | Freq: Four times a day (QID) | ORAL | Status: DC | PRN

## 2019-07-23 MED ORDER — POVIDONE-IODINE 7.5 % EX SOLN
Freq: Once | CUTANEOUS | Status: DC
  Filled 2019-07-23: qty 118

## 2019-07-23 MED ORDER — POTASSIUM CHLORIDE IN NACL 20-0.9 MEQ/L-% IV SOLN
INTRAVENOUS | Status: DC
  Filled 2019-07-23: qty 1000

## 2019-07-23 MED ORDER — LACTATED RINGERS IV SOLN
INTRAVENOUS | Status: DC | PRN
  Administered 2019-07-23: 08:00:00 via INTRAVENOUS

## 2019-07-23 MED ORDER — OXYCODONE-ACETAMINOPHEN 5-325 MG PO TABS
1.0000 | ORAL_TABLET | ORAL | Status: DC | PRN
  Administered 2019-07-23 – 2019-07-25 (×8): 2 via ORAL
  Filled 2019-07-23 (×8): qty 2

## 2019-07-23 MED ORDER — SODIUM CHLORIDE 0.9 % IV SOLN: 250.0000 mL | INTRAVENOUS | Status: DC

## 2019-07-23 MED ORDER — CEFAZOLIN SODIUM-DEXTROSE 2-4 GM/100ML-% IV SOLN
2.0000 g | Freq: Three times a day (TID) | INTRAVENOUS | Status: AC
  Administered 2019-07-23 (×2): 2 g via INTRAVENOUS
  Filled 2019-07-23 (×2): qty 100

## 2019-07-23 MED ORDER — VITAMIN D 25 MCG (1000 UNIT) PO TABS
2000.0000 [IU] | ORAL_TABLET | Freq: Every day | ORAL | Status: DC
  Administered 2019-07-25: 2000 [IU] via ORAL
  Filled 2019-07-23: qty 2

## 2019-07-23 MED ORDER — PROPOFOL 500 MG/50ML IV EMUL
INTRAVENOUS | Status: DC | PRN
  Administered 2019-07-23: 50 ug/kg/min via INTRAVENOUS

## 2019-07-23 MED ORDER — ONDANSETRON HCL 4 MG/2ML IJ SOLN
INTRAMUSCULAR | Status: AC
  Filled 2019-07-23: qty 2

## 2019-07-23 MED ORDER — DIAZEPAM 5 MG PO TABS
5.0000 mg | ORAL_TABLET | Freq: Four times a day (QID) | ORAL | Status: DC | PRN
  Administered 2019-07-23 – 2019-07-25 (×4): 5 mg via ORAL
  Filled 2019-07-23 (×3): qty 1

## 2019-07-23 MED ORDER — DEXAMETHASONE SODIUM PHOSPHATE 10 MG/ML IJ SOLN
INTRAMUSCULAR | Status: DC | PRN
  Administered 2019-07-23: 10 mg via INTRAVENOUS

## 2019-07-23 MED ORDER — SUCCINYLCHOLINE CHLORIDE 200 MG/10ML IV SOSY
PREFILLED_SYRINGE | INTRAVENOUS | Status: AC
  Filled 2019-07-23: qty 10

## 2019-07-23 MED ORDER — BUPIVACAINE-EPINEPHRINE (PF) 0.25% -1:200000 IJ SOLN
INTRAMUSCULAR | Status: AC
  Filled 2019-07-23: qty 30

## 2019-07-23 MED ORDER — MORPHINE SULFATE (PF) 2 MG/ML IV SOLN
1.0000 mg | INTRAVENOUS | Status: DC | PRN
  Administered 2019-07-24: 2 mg via INTRAVENOUS
  Filled 2019-07-23: qty 1

## 2019-07-23 MED ORDER — SODIUM CHLORIDE 0.9% FLUSH
3.0000 mL | Freq: Two times a day (BID) | INTRAVENOUS | Status: DC
  Administered 2019-07-23 – 2019-07-24 (×2): 3 mL via INTRAVENOUS

## 2019-07-23 MED ORDER — FLEET ENEMA 7-19 GM/118ML RE ENEM: 1.0000 | ENEMA | Freq: Once | RECTAL | Status: DC | PRN

## 2019-07-23 MED ORDER — PROPOFOL 10 MG/ML IV BOLUS
INTRAVENOUS | Status: DC | PRN
  Administered 2019-07-23: 150 mg via INTRAVENOUS

## 2019-07-23 MED ORDER — EPHEDRINE SULFATE-NACL 50-0.9 MG/10ML-% IV SOSY
PREFILLED_SYRINGE | INTRAVENOUS | Status: DC | PRN
  Administered 2019-07-23: 10 mg via INTRAVENOUS

## 2019-07-23 MED ORDER — TAMSULOSIN HCL 0.4 MG PO CAPS
0.4000 mg | ORAL_CAPSULE | Freq: Every day | ORAL | Status: DC
  Administered 2019-07-23 – 2019-07-25 (×3): 0.4 mg via ORAL
  Filled 2019-07-23 (×3): qty 1

## 2019-07-23 MED ORDER — HEMOSTATIC AGENTS (NO CHARGE) OPTIME
TOPICAL | Status: DC | PRN
  Administered 2019-07-23: 1 via TOPICAL

## 2019-07-23 MED ORDER — FENTANYL CITRATE (PF) 250 MCG/5ML IJ SOLN
INTRAMUSCULAR | Status: AC
  Filled 2019-07-23: qty 5

## 2019-07-23 MED ORDER — PHENOL 1.4 % MT LIQD: 1.0000 | OROMUCOSAL | Status: DC | PRN

## 2019-07-23 MED ORDER — ZOLPIDEM TARTRATE 5 MG PO TABS: 5.0000 mg | ORAL_TABLET | Freq: Every evening | ORAL | Status: DC | PRN

## 2019-07-23 MED ORDER — ACETAMINOPHEN 650 MG RE SUPP: 650.0000 mg | RECTAL | Status: DC | PRN

## 2019-07-23 MED ORDER — BUPIVACAINE-EPINEPHRINE 0.25% -1:200000 IJ SOLN
INTRAMUSCULAR | Status: DC | PRN
  Administered 2019-07-23: 5 mL

## 2019-07-23 MED ORDER — LIDOCAINE 2% (20 MG/ML) 5 ML SYRINGE
INTRAMUSCULAR | Status: AC
  Filled 2019-07-23: qty 5

## 2019-07-23 MED ORDER — HYDROCHLOROTHIAZIDE 25 MG PO TABS
25.0000 mg | ORAL_TABLET | Freq: Every day | ORAL | Status: DC
  Administered 2019-07-24 – 2019-07-25 (×2): 25 mg via ORAL
  Filled 2019-07-23 (×2): qty 1

## 2019-07-23 MED ORDER — SUCCINYLCHOLINE CHLORIDE 20 MG/ML IJ SOLN
INTRAMUSCULAR | Status: DC | PRN
  Administered 2019-07-23: 100 mg via INTRAVENOUS

## 2019-07-23 MED ORDER — DIAZEPAM 5 MG PO TABS: 5.0000 mg | ORAL_TABLET | Freq: Four times a day (QID) | ORAL | 0 refills | Status: DC | PRN

## 2019-07-23 MED ORDER — HYDRALAZINE HCL 10 MG PO TABS
10.0000 mg | ORAL_TABLET | Freq: Two times a day (BID) | ORAL | Status: DC
  Administered 2019-07-23 – 2019-07-25 (×4): 10 mg via ORAL
  Filled 2019-07-23 (×4): qty 1

## 2019-07-23 MED ORDER — DEXTROSE 5 % IV SOLN
3.0000 g | INTRAVENOUS | Status: DC
  Filled 2019-07-23: qty 3000

## 2019-07-23 MED ORDER — PROMETHAZINE HCL 25 MG PO TABS: 12.5000 mg | ORAL_TABLET | Freq: Four times a day (QID) | ORAL | Status: DC | PRN

## 2019-07-23 MED ORDER — PHENYLEPHRINE 40 MCG/ML (10ML) SYRINGE FOR IV PUSH (FOR BLOOD PRESSURE SUPPORT)
PREFILLED_SYRINGE | INTRAVENOUS | Status: DC | PRN
  Administered 2019-07-23 (×2): 120 ug via INTRAVENOUS

## 2019-07-23 MED ORDER — LACTATED RINGERS IV SOLN
INTRAVENOUS | Status: DC | PRN
  Administered 2019-07-23: 09:00:00 via INTRAVENOUS

## 2019-07-23 MED ORDER — STERILE WATER FOR IRRIGATION IR SOLN
Status: DC | PRN
  Administered 2019-07-23: 1000 mL

## 2019-07-23 MED ORDER — ONDANSETRON HCL 4 MG/2ML IJ SOLN
INTRAMUSCULAR | Status: DC | PRN
  Administered 2019-07-23: 4 mg via INTRAVENOUS

## 2019-07-23 MED ORDER — ALUM & MAG HYDROXIDE-SIMETH 200-200-20 MG/5ML PO SUSP: 30.0000 mL | Freq: Four times a day (QID) | ORAL | Status: DC | PRN

## 2019-07-23 MED ORDER — ONDANSETRON HCL 4 MG/2ML IJ SOLN: 4.0000 mg | Freq: Four times a day (QID) | INTRAMUSCULAR | Status: DC | PRN

## 2019-07-23 MED ORDER — BUPIVACAINE LIPOSOME 1.3 % IJ SUSP
20.0000 mL | INTRAMUSCULAR | Status: DC
  Filled 2019-07-23: qty 20

## 2019-07-23 MED ORDER — FENTANYL CITRATE (PF) 100 MCG/2ML IJ SOLN
INTRAMUSCULAR | Status: DC | PRN
  Administered 2019-07-23: 100 ug via INTRAVENOUS

## 2019-07-23 MED ORDER — MIDAZOLAM HCL 2 MG/2ML IJ SOLN
INTRAMUSCULAR | Status: AC
  Filled 2019-07-23: qty 2

## 2019-07-23 MED ORDER — OXYCODONE-ACETAMINOPHEN 5-325 MG PO TABS: 1.0000 | ORAL_TABLET | ORAL | 0 refills | Status: DC | PRN

## 2019-07-23 MED ORDER — SODIUM CHLORIDE 0.9% FLUSH: 3.0000 mL | INTRAVENOUS | Status: DC | PRN

## 2019-07-23 MED ORDER — ATENOLOL 50 MG PO TABS
50.0000 mg | ORAL_TABLET | Freq: Every day | ORAL | Status: DC
  Administered 2019-07-24 – 2019-07-25 (×2): 50 mg via ORAL
  Filled 2019-07-23 (×2): qty 1

## 2019-07-23 MED ORDER — LIDOCAINE 2% (20 MG/ML) 5 ML SYRINGE
INTRAMUSCULAR | Status: DC | PRN
  Administered 2019-07-23: 80 mg via INTRAVENOUS

## 2019-07-23 MED ORDER — THROMBIN (RECOMBINANT) 20000 UNITS EX SOLR
CUTANEOUS | Status: AC
  Filled 2019-07-23: qty 20000

## 2019-07-23 MED ORDER — ACETAMINOPHEN 325 MG PO TABS: 650.0000 mg | ORAL_TABLET | ORAL | Status: DC | PRN

## 2019-07-23 MED ORDER — FENTANYL CITRATE (PF) 100 MCG/2ML IJ SOLN
25.0000 ug | INTRAMUSCULAR | Status: DC | PRN
  Administered 2019-07-23 (×2): 50 ug via INTRAVENOUS

## 2019-07-23 SURGICAL SUPPLY — 93 items
AGENT HMST KT MTR STRL THRMB (HEMOSTASIS)
APL SKNCLS STERI-STRIP NONHPOA (GAUZE/BANDAGES/DRESSINGS) ×2
BENZOIN TINCTURE PRP APPL 2/3 (GAUZE/BANDAGES/DRESSINGS) ×4 IMPLANT
BLADE CLIPPER SURG (BLADE) IMPLANT
BONE VIVIGEN FORMABLE 10CC (Bone Implant) ×4 IMPLANT
BUR PRESCISION 1.7 ELITE (BURR) ×4 IMPLANT
BUR ROUND FLUTED 5 RND (BURR) ×3 IMPLANT
BUR ROUND FLUTED 5MM RND (BURR) ×1
BUR ROUND PRECISION 4.0 (BURR) IMPLANT
BUR ROUND PRECISION 4.0MM (BURR)
BUR SABER RD CUTTING 3.0 (BURR) IMPLANT
BUR SABER RD CUTTING 3.0MM (BURR)
CARTRIDGE OIL MAESTRO DRILL (MISCELLANEOUS) ×2 IMPLANT
CLOSURE STERI-STRIP 1/4X4 (GAUZE/BANDAGES/DRESSINGS) ×2 IMPLANT
CLOSURE WOUND 1/2 X4 (GAUZE/BANDAGES/DRESSINGS) ×2
CONT SPEC 4OZ CLIKSEAL STRL BL (MISCELLANEOUS) ×4 IMPLANT
CORD BIPOLAR FORCEPS 12FT (ELECTRODE) ×2 IMPLANT
COVER BACK TABLE 60X90IN (DRAPES) ×4 IMPLANT
COVER MAYO STAND STRL (DRAPES) ×8 IMPLANT
COVER SURGICAL LIGHT HANDLE (MISCELLANEOUS) ×4 IMPLANT
COVER WAND RF STERILE (DRAPES) ×4 IMPLANT
DIFFUSER DRILL AIR PNEUMATIC (MISCELLANEOUS) ×4 IMPLANT
DRAIN CHANNEL 15F RND FF W/TCR (WOUND CARE) IMPLANT
DRAPE C-ARM 42X72 X-RAY (DRAPES) ×4 IMPLANT
DRAPE C-ARMOR (DRAPES) IMPLANT
DRAPE POUCH INSTRU U-SHP 10X18 (DRAPES) ×4 IMPLANT
DRAPE SURG 17X23 STRL (DRAPES) ×16 IMPLANT
DURAPREP 26ML APPLICATOR (WOUND CARE) ×4 IMPLANT
ELECT BLADE 4.0 EZ CLEAN MEGAD (MISCELLANEOUS) ×4
ELECT CAUTERY BLADE 6.4 (BLADE) ×4 IMPLANT
ELECT REM PT RETURN 9FT ADLT (ELECTROSURGICAL) ×4
ELECTRODE BLDE 4.0 EZ CLN MEGD (MISCELLANEOUS) ×2 IMPLANT
ELECTRODE REM PT RTRN 9FT ADLT (ELECTROSURGICAL) ×2 IMPLANT
EVACUATOR SILICONE 100CC (DRAIN) IMPLANT
FILTER STRAW FLUID ASPIR (MISCELLANEOUS) ×4 IMPLANT
GAUZE 4X4 16PLY RFD (DISPOSABLE) ×4 IMPLANT
GAUZE SPONGE 4X4 12PLY STRL (GAUZE/BANDAGES/DRESSINGS) ×4 IMPLANT
GLOVE BIO SURGEON STRL SZ7 (GLOVE) ×4 IMPLANT
GLOVE BIO SURGEON STRL SZ8 (GLOVE) ×4 IMPLANT
GLOVE BIOGEL PI IND STRL 7.0 (GLOVE) ×2 IMPLANT
GLOVE BIOGEL PI IND STRL 8 (GLOVE) ×2 IMPLANT
GLOVE BIOGEL PI INDICATOR 7.0 (GLOVE) ×2
GLOVE BIOGEL PI INDICATOR 8 (GLOVE) ×2
GOWN STRL REUS W/ TWL LRG LVL3 (GOWN DISPOSABLE) ×4 IMPLANT
GOWN STRL REUS W/ TWL XL LVL3 (GOWN DISPOSABLE) ×2 IMPLANT
GOWN STRL REUS W/TWL LRG LVL3 (GOWN DISPOSABLE) ×8
GOWN STRL REUS W/TWL XL LVL3 (GOWN DISPOSABLE) ×4
GRAFT BNE MATRIX VG FRMBL L 10 (Bone Implant) IMPLANT
IMPL COROENT XL 12X18X55MM IMPLANT
IMPLANT COROENT XL 12X18X55MM ×4 IMPLANT
IV CATH 14GX2 1/4 (CATHETERS) ×4 IMPLANT
KIT BASIN OR (CUSTOM PROCEDURE TRAY) ×4 IMPLANT
KIT DILATOR XLIF 5 (KITS) ×1 IMPLANT
KIT POSITION SURG JACKSON T1 (MISCELLANEOUS) ×4 IMPLANT
KIT SURGICAL ACCESS MAXCESS 4 (KITS) ×2 IMPLANT
KIT TURNOVER KIT B (KITS) ×4 IMPLANT
KIT XLIF (KITS) ×1
MARKER SKIN DUAL TIP RULER LAB (MISCELLANEOUS) ×8 IMPLANT
MODULE EMG NDL SSEP NVM5 (NEEDLE) IMPLANT
MODULE EMG NEEDLE SSEP NVM5 (NEEDLE) ×4 IMPLANT
MODULE NVM5 NEXT GEN EMG (NEEDLE) ×2 IMPLANT
NDL 18GX1X1/2 (RX/OR ONLY) (NEEDLE) ×2 IMPLANT
NDL HYPO 25GX1X1/2 BEV (NEEDLE) ×2 IMPLANT
NDL SPNL 18GX3.5 QUINCKE PK (NEEDLE) ×4 IMPLANT
NEEDLE 18GX1X1/2 (RX/OR ONLY) (NEEDLE) ×4 IMPLANT
NEEDLE 22X1 1/2 (OR ONLY) (NEEDLE) ×8 IMPLANT
NEEDLE HYPO 25GX1X1/2 BEV (NEEDLE) ×4 IMPLANT
NEEDLE SPNL 18GX3.5 QUINCKE PK (NEEDLE) ×8 IMPLANT
NS IRRIG 1000ML POUR BTL (IV SOLUTION) ×4 IMPLANT
OIL CARTRIDGE MAESTRO DRILL (MISCELLANEOUS) ×4
PACK LAMINECTOMY ORTHO (CUSTOM PROCEDURE TRAY) ×4 IMPLANT
PACK UNIVERSAL I (CUSTOM PROCEDURE TRAY) ×4 IMPLANT
PAD ARMBOARD 7.5X6 YLW CONV (MISCELLANEOUS) ×8 IMPLANT
PATTIES SURGICAL .5 X1 (DISPOSABLE) ×4 IMPLANT
PATTIES SURGICAL .5X1.5 (GAUZE/BANDAGES/DRESSINGS) ×4 IMPLANT
SPONGE INTESTINAL PEANUT (DISPOSABLE) ×8 IMPLANT
SPONGE SURGIFOAM ABS GEL 100 (HEMOSTASIS) ×4 IMPLANT
STRIP CLOSURE SKIN 1/2X4 (GAUZE/BANDAGES/DRESSINGS) ×6 IMPLANT
SURGIFLO W/THROMBIN 8M KIT (HEMOSTASIS) IMPLANT
SUT MNCRL AB 4-0 PS2 18 (SUTURE) ×4 IMPLANT
SUT VIC AB 0 CT1 18XCR BRD 8 (SUTURE) ×2 IMPLANT
SUT VIC AB 0 CT1 8-18 (SUTURE) ×4
SUT VIC AB 1 CT1 18XCR BRD 8 (SUTURE) ×2 IMPLANT
SUT VIC AB 1 CT1 8-18 (SUTURE) ×4
SUT VIC AB 2-0 CT2 18 VCP726D (SUTURE) ×4 IMPLANT
SYR 20ML LL LF (SYRINGE) ×8 IMPLANT
SYR BULB IRRIGATION 50ML (SYRINGE) ×4 IMPLANT
SYR CONTROL 10ML LL (SYRINGE) ×8 IMPLANT
SYR TB 1ML LUER SLIP (SYRINGE) ×4 IMPLANT
TAPE CLOTH SURG 6X10 WHT LF (GAUZE/BANDAGES/DRESSINGS) ×2 IMPLANT
TRAY FOLEY MTR SLVR 16FR STAT (SET/KITS/TRAYS/PACK) ×4 IMPLANT
WATER STERILE IRR 1000ML POUR (IV SOLUTION) ×4 IMPLANT
YANKAUER SUCT BULB TIP NO VENT (SUCTIONS) ×4 IMPLANT

## 2019-07-23 NOTE — Transfer of Care (Signed)
Immediate Anesthesia Transfer of Care Note  Patient: Evan Morgan  Procedure(s) Performed: LUMBAR TWO-THREE LEFT LATERAL INTERBODY FUSION WITH INSTRUMENTATION AND ALLOGRAFT  Patient Location: PACU  Anesthesia Type:General  Level of Consciousness: drowsy and patient cooperative  Airway & Oxygen Therapy: Patient Spontanous Breathing and Patient connected to face mask oxygen  Post-op Assessment: Report given to RN  Post vital signs: Reviewed and stable  Last Vitals:  Vitals Value Taken Time  BP 142/82 07/23/19 1129  Temp    Pulse 99 07/23/19 1130  Resp 18 07/23/19 1130  SpO2 95 % 07/23/19 1130  Vitals shown include unvalidated device data.  Last Pain:  Vitals:   07/23/19 0710  TempSrc:   PainSc: 8       Patients Stated Pain Goal: 5 (38/18/29 9371)  Complications: No apparent anesthesia complications

## 2019-07-23 NOTE — Anesthesia Preprocedure Evaluation (Addendum)
Anesthesia Evaluation  Patient identified by MRN, date of birth, ID band Patient awake    Reviewed: Allergy & Precautions, NPO status , Patient's Chart, lab work & pertinent test results, reviewed documented beta blocker date and time   History of Anesthesia Complications Negative for: history of anesthetic complications  Airway Mallampati: II  TM Distance: >3 FB Neck ROM: Full    Dental   Pulmonary neg pulmonary ROS,    Pulmonary exam normal        Cardiovascular hypertension, Pt. on medications and Pt. on home beta blockers Normal cardiovascular exam     Neuro/Psych negative neurological ROS  negative psych ROS   GI/Hepatic negative GI ROS, (+) Hepatitis -, C  Endo/Other  negative endocrine ROS  Renal/GU negative Renal ROS  negative genitourinary   Musculoskeletal negative musculoskeletal ROS (+)   Abdominal   Peds  Hematology negative hematology ROS (+)   Anesthesia Other Findings   Reproductive/Obstetrics                            Anesthesia Physical Anesthesia Plan  ASA: III  Anesthesia Plan: General   Post-op Pain Management:    Induction: Intravenous  PONV Risk Score and Plan: 3 and Ondansetron, Dexamethasone, Treatment may vary due to age or medical condition and Midazolam  Airway Management Planned: Oral ETT  Additional Equipment: None  Intra-op Plan:   Post-operative Plan: Extubation in OR  Informed Consent: I have reviewed the patients History and Physical, chart, labs and discussed the procedure including the risks, benefits and alternatives for the proposed anesthesia with the patient or authorized representative who has indicated his/her understanding and acceptance.     Dental advisory given  Plan Discussed with:   Anesthesia Plan Comments:        Anesthesia Quick Evaluation

## 2019-07-23 NOTE — H&P (Signed)
PREOPERATIVE H&P  Chief Complaint: Low back pain  HPI: Evan Morgan is a 68 y.o. male who presents with ongoing pain in the low back  Imaging reveals a nonunion at L3/4 with loose pedicle screws at the medial pedicles  Patient has failed multiple forms of conservative care and continues to have pain (see office notes for additional details regarding the patient's full course of treatment)  Past Medical History:  Diagnosis Date  . Asthma    As a child  . History of hepatitis C   . Hypertension    Past Surgical History:  Procedure Laterality Date  . BACK SURGERY     Social History   Socioeconomic History  . Marital status: Single    Spouse name: Not on file  . Number of children: Not on file  . Years of education: Not on file  . Highest education level: Not on file  Occupational History  . Not on file  Social Needs  . Financial resource strain: Not on file  . Food insecurity    Worry: Not on file    Inability: Not on file  . Transportation needs    Medical: Not on file    Non-medical: Not on file  Tobacco Use  . Smoking status: Never Smoker  . Smokeless tobacco: Never Used  Substance and Sexual Activity  . Alcohol use: Yes    Alcohol/week: 6.0 standard drinks    Types: 6 Cans of beer per week    Comment: 1/2 PINT LIQUOR WEEKLY  . Drug use: Never  . Sexual activity: Not on file  Lifestyle  . Physical activity    Days per week: Not on file    Minutes per session: Not on file  . Stress: Not on file  Relationships  . Social Herbalist on phone: Not on file    Gets together: Not on file    Attends religious service: Not on file    Active member of club or organization: Not on file    Attends meetings of clubs or organizations: Not on file    Relationship status: Not on file  Other Topics Concern  . Not on file  Social History Narrative  . Not on file   History reviewed. No pertinent family history. Allergies  Allergen Reactions  .  Amlodipine Other (See Comments)    Unknown reaction   . Codeine Nausea And Vomiting  . Lisinopril Cough   Prior to Admission medications   Medication Sig Start Date End Date Taking? Authorizing Provider  acetaminophen (TYLENOL) 500 MG tablet Take 500 mg by mouth every 4 (four) hours as needed for mild pain or headache.    Yes [provider]  atenolol (TENORMIN) 50 MG tablet Take 50 mg by mouth daily.    Yes [provider]  Cholecalciferol (VITAMIN D-1000 MAX ST) 25 MCG (1000 UT) tablet Take 2,000 Units by mouth daily.    Yes [provider]  finasteride (PROSCAR) 5 MG tablet Take 5 mg by mouth daily.   Yes [provider]  gabapentin (NEURONTIN) 300 MG capsule Take 900 mg by mouth 2 (two) times daily.    Yes [provider]  hydrALAZINE (APRESOLINE) 10 MG tablet Take 10 mg by mouth 2 (two) times daily.   Yes [provider]  hydrochlorothiazide (HYDRODIURIL) 25 MG tablet Take 25 mg by mouth daily.    Yes [provider]  oxyCODONE-acetaminophen (PERCOCET) 10-325 MG tablet Take 1  tablet by mouth every 4 (four) hours as needed for pain.  02/05/17  Yes [provider]  tamsulosin (FLOMAX) 0.4 MG CAPS capsule Take 0.4 mg by mouth daily.    Yes [provider]  lidocaine (LMX) 4 % cream Apply 1 application topically as needed (legs).    [provider]     All other systems have been reviewed and were otherwise negative with the exception of those mentioned in the HPI and as above.  Physical Exam: Vitals:   07/23/19 0649 07/23/19 0710  BP: (!) 176/92 (!) 156/86  Pulse: 65   Resp: 20   Temp: 97.9 F (36.6 C)   SpO2: 100%     Body mass index is 39.05 kg/m.  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

## 2019-07-23 NOTE — Op Note (Signed)
PATIENT NAME: Evan Morgan   MEDICAL RECORD NO.:   [id number]    DATE OF BIRTH: Jan 28, 1951  DATE OF PROCEDURE: 07/23/2019                               OPERATIVE REPORT     PREOPERATIVE DIAGNOSES: 1.  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  Ongoing severe low back pain and bilateral leg pain    POSTOPERATIVE DIAGNOSES: 1.  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  Ongoing severe low back pain and bilateral leg pain    PROCEDURE: 1. Direct lateral interbody fusion, via a left-sided approach,  L2-3. 2. Insertion of interbody device x1 (55mm x 45mm x 65mm NuVasive intervertebral spacer). 3. Intraoperative use of fluoroscopy. 4.  Use of allograft - Vivigen   SURGEON:  Estill Bamberg, MD.   ASSISTANTJason Coop, PA-C.   ANESTHESIA:  General endotracheal anesthesia.   COMPLICATIONS:  None.   DISPOSITION:  Stable.   ESTIMATED BLOOD LOSS:  Minimal.   INDICATIONS FOR SURGERY:  Briefly, Evan Morgan is a pleasant 68 year old male who did present to me with ongoing and severe pain in the back and bilateral legs.  The pain was debilitating and severely limiting his function.  A CAT scan did reveal a nonunion at L3-4.  Of particular note, the L3 pedicle screws were very loose, and were approaching the medial pedicle borders, immediately adjacent to the spinal canal and L3 nerve roots.  The patient's pain was ongoing and severe.  Given his failure of hardware at the L3-4 level and ongoing pain, I did discuss a revision procedure with him.  I did express to him that to address his nonunion, we would need to extend his fusion up to the L2-3 level, as the L3 level was not able to be reinstrumented, given the very large screws placed, in combination with the fact that they were loose immediately adjacent to the spinal canal and nerve roots.  We therefore did discuss a staged procedure.  Specifically, a lateral interbody fusion at L2-3 with instrumentation and allograft on stage 1, to be  followed by a revision fusion at L3-4, with a posterior fusion at L2-3, with revision instrumentation on the following day.  The patient did fully understand the risks and benefits of surgery, and did wish to proceed.   OPERATIVE DETAILS:  On 07/23/2019, the patient was brought to surgery and general endotracheal anesthesia was administered.  The patient was placed in the lateral decubitus position with the left side up.  The patient's hips and knees were flexed and she was secured to the bed. Her left arm was placed on an arm board.  All bony prominences were meticulously padded.  The bed was appropriately angled in order to ensure a perfect AP and lateral trajectory during surgery.  The patient's left flank was then prepped and draped in the usual sterile fashion.  A time-out procedure was performed.  Of note, the patient did receive antibiotics prior to the incision.  At this point, a left-sided incision was in line with the L2-3 intervertebral space.  The external and internal oblique musculature was dissected, and the transversalis fascia was entered, and the retroperitoneal space was noted.  The peritoneum was bluntly swept anteriorly.  I then was able to identify the psoas muscle.  I did use the initial dilator, which was advanced through the psoas muscle, liberally using neurologic monitoring.  There were no neurologic structures in the immediate vicinity of the dilator.  I then sequentially dilated and placed a self-retaining retractor, which was secured to the bed using a rigid arm.  The retractor was slightly opened.  Again, using triggered EMG, it was clear that there were no neurologic structures in the immediate vicinity of the intervertebral disk space.  At this point, I performed an annulotomy and I did release the contralateral annulus.  I then performed a thorough and complete L2- 3 intervertebral diskectomy.  I then placed a series of trials.  I did ultimately elect to  place an implant, 12 mm in height, as I did feel this was the most appropriate fit.   The implant was liberally packed with ViviGen and was tamped into position.   I was very pleased with the press-fit of the implant.  The break in the bed was then undone and the bed was flattened.  I was very pleased with the final AP and lateral fluoroscopic images.  The wound was then copiously irrigated.  The wound was then closed in layers using #1 Vicryl, followed by 2-0 Vicryl, followed by 4- 0 Monocryl.  Benzoin and Steri-Strips were applied followed by a sterile dressing.  All instrument counts were correct at the termination of the procedure.   Of note, Pricilla Holm, was my assistant throughout surgery, and did aid in retraction, suctioning, and closure from start to finish.       Phylliss Bob, MD

## 2019-07-23 NOTE — Anesthesia Procedure Notes (Addendum)
Procedure Name: Intubation Date/Time: 07/23/2019 8:38 AM Performed by: Barrington Ellison, CRNA Pre-anesthesia Checklist: Patient identified, Emergency Drugs available, Suction available and Patient being monitored Patient Re-evaluated:Patient Re-evaluated prior to induction Oxygen Delivery Method: Circle System Utilized Preoxygenation: Pre-oxygenation with 100% oxygen Induction Type: IV induction and Rapid sequence Ventilation: Mask ventilation without difficulty Laryngoscope Size: Mac and 4 Grade View: Grade I Tube type: Oral Tube size: 7.5 mm Number of attempts: 1 Airway Equipment and Method: Stylet and Oral airway Placement Confirmation: ETT inserted through vocal cords under direct vision,  positive ETCO2 and breath sounds checked- equal and bilateral Secured at: 21 cm Tube secured with: Tape Dental Injury: Teeth and Oropharynx as per pre-operative assessment

## 2019-07-24 ENCOUNTER — Inpatient Hospital Stay (HOSPITAL_COMMUNITY): Payer: Medicare Other | Admitting: Anesthesiology

## 2019-07-24 ENCOUNTER — Encounter (HOSPITAL_COMMUNITY)
Admission: RE | Disposition: A | Discharge status: Home / Self Care | Payer: Self-pay | Source: Home / Self Care | Attending: Orthopedic Surgery

## 2019-07-24 ENCOUNTER — Inpatient Hospital Stay (HOSPITAL_COMMUNITY): Admission: RE | Admit: 2019-07-24 | Payer: Medicare Other | Source: Home / Self Care | Admitting: Orthopedic Surgery

## 2019-07-24 ENCOUNTER — Inpatient Hospital Stay (HOSPITAL_COMMUNITY): Payer: Medicare Other

## 2019-07-24 ENCOUNTER — Encounter (HOSPITAL_COMMUNITY): Payer: Self-pay | Admitting: Orthopedic Surgery

## 2019-07-24 IMAGING — RF DG LUMBAR SPINE 2-3V
1 series · 2 of 2 positions shown · non-contrast
Comparison: Lumbar spine MRI [DATE]

CLINICAL DATA: Revision of L3-4 fusion and new L2-3 fusion.

EXAM:
LUMBAR SPINE - 2-3 VIEW; DG C-ARM 1-60 MIN

[Series 1: run · 2 of 2 slices shown]
[im 1/2]
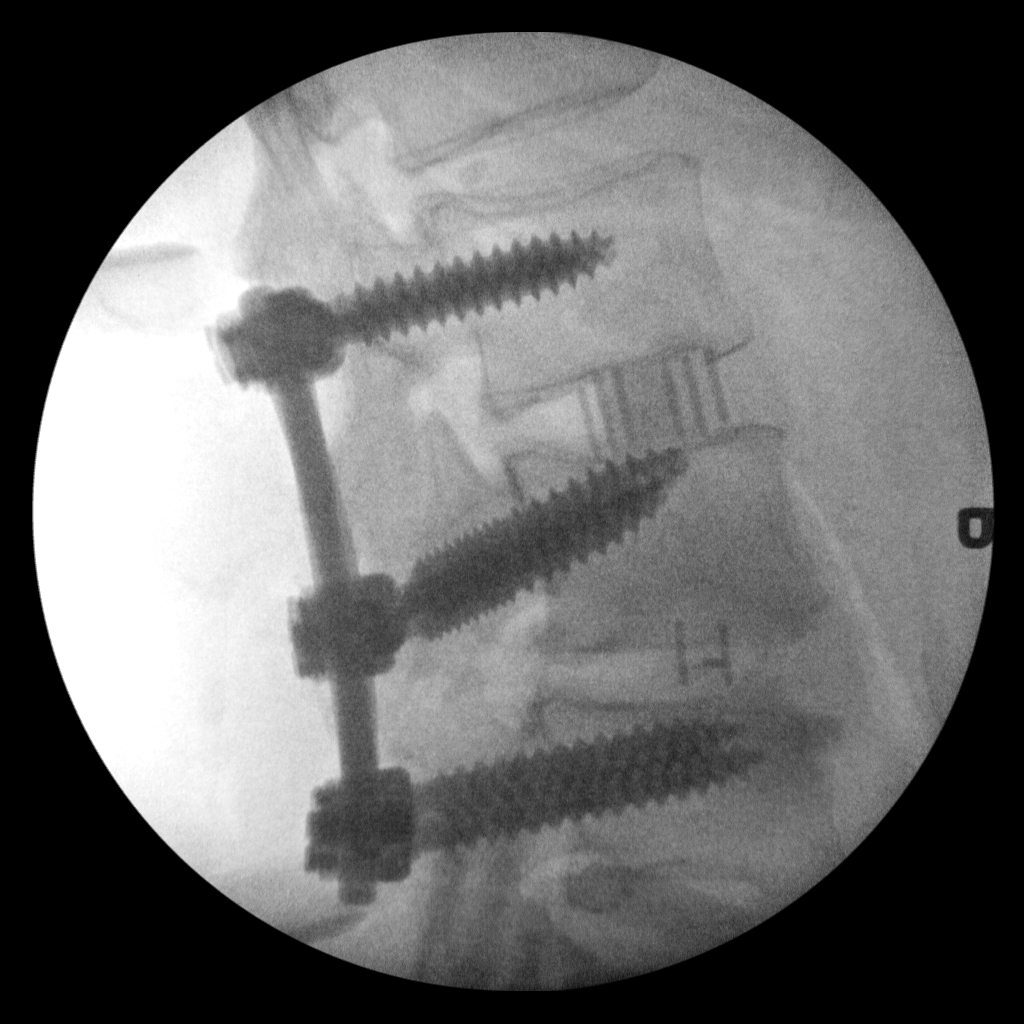
[im 2/2]
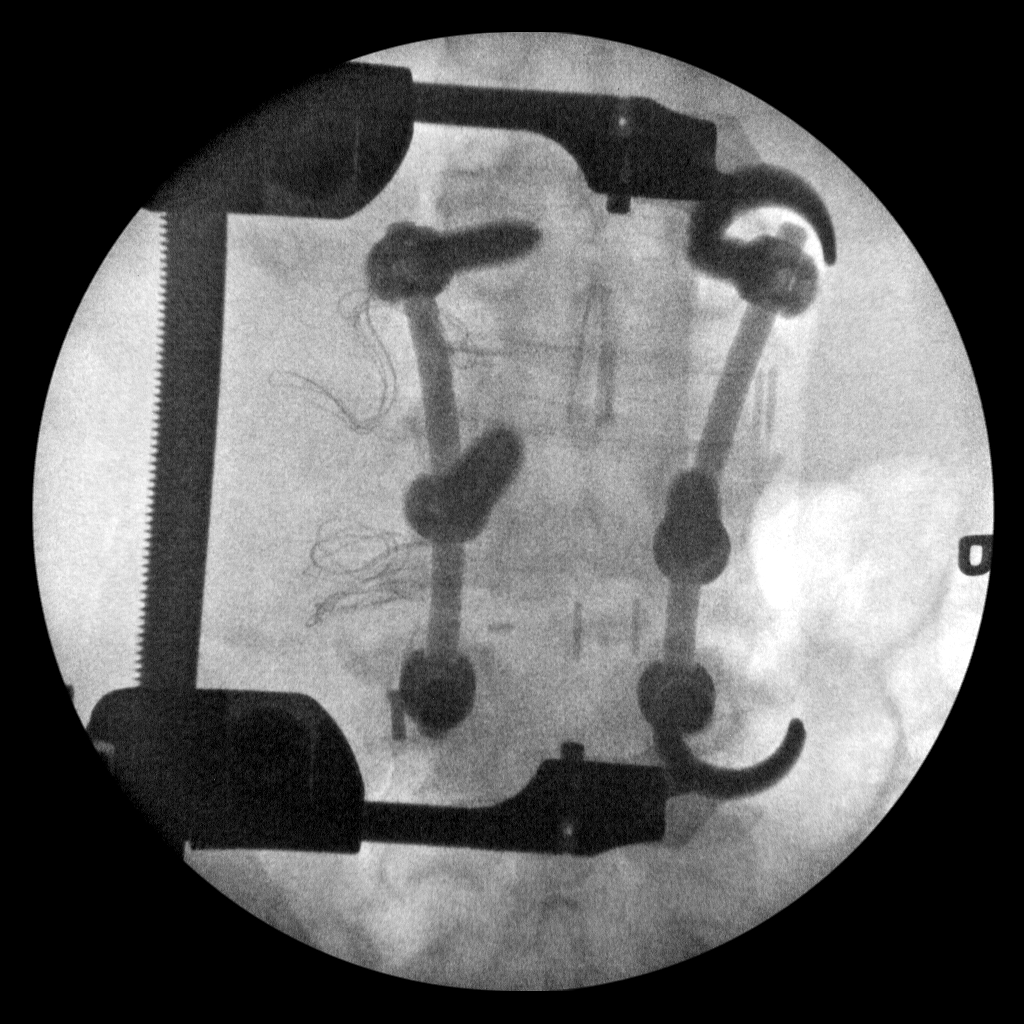

[2 of 2 positions shown; findings below may reference images not displayed]

FINDINGS: Intraoperative fluoroscopic spot images demonstrate pedicle screws
and posterior rods fusing L2-3 and L3-4. The hardware is in good
position without complicating features.
IMPRESSION: L2 to L4 posterior and interbody fusion.

## 2019-07-24 IMAGING — RF DG C-ARM 1-60 MIN
1 series · 2 of 2 positions shown · non-contrast
Comparison: Lumbar spine MRI [DATE]

CLINICAL DATA: Revision of L3-4 fusion and new L2-3 fusion.

EXAM:
LUMBAR SPINE - 2-3 VIEW; DG C-ARM 1-60 MIN

[Series 1: run · 2 of 2 slices shown]
[im 1/2]
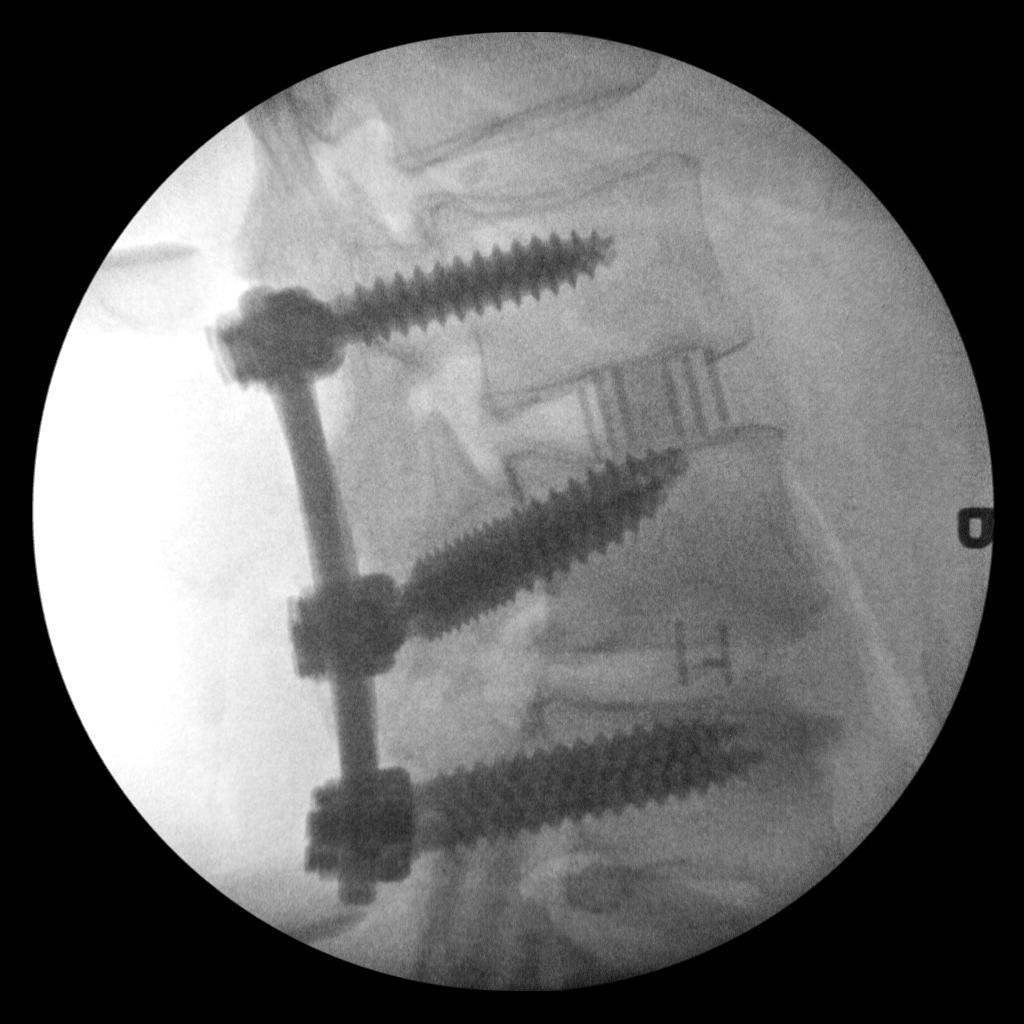
[im 2/2]
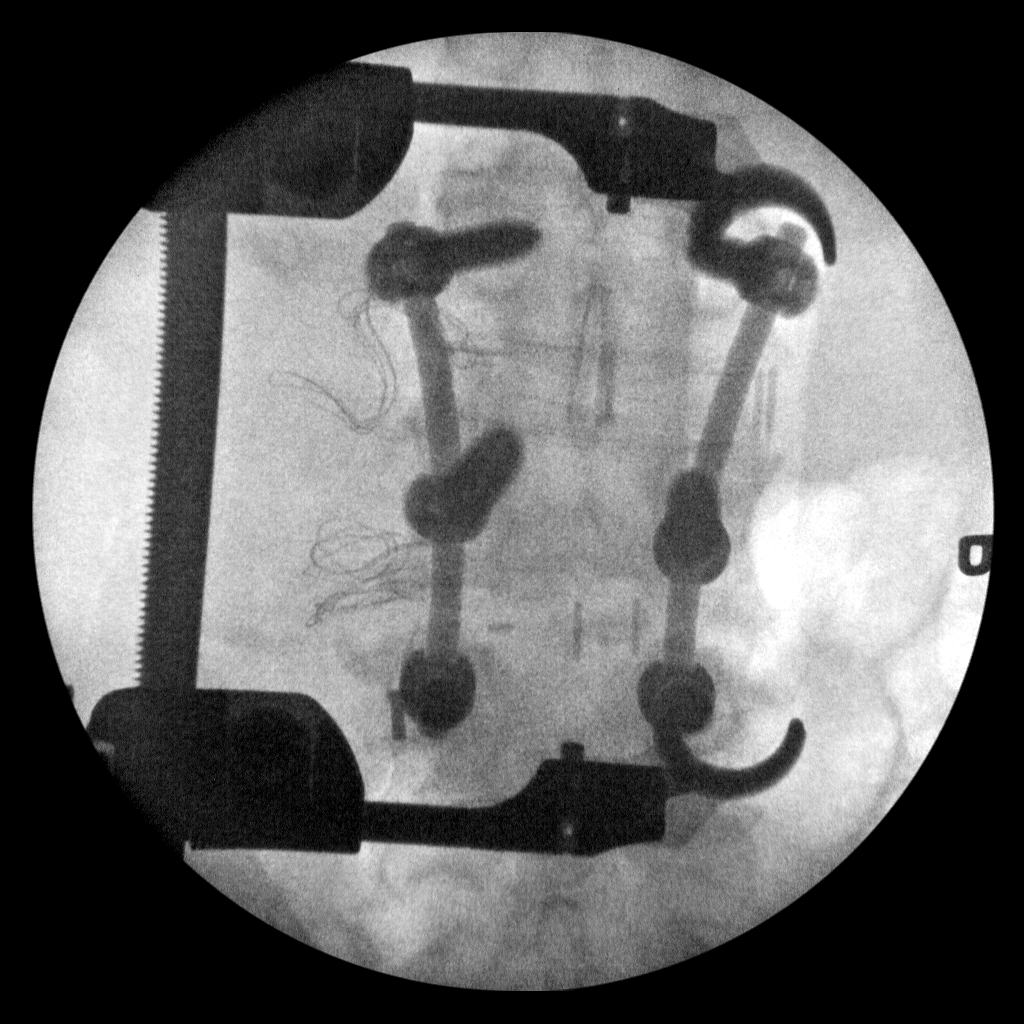

[2 of 2 positions shown; findings below may reference images not displayed]

FINDINGS: Intraoperative fluoroscopic spot images demonstrate pedicle screws
and posterior rods fusing L2-3 and L3-4. The hardware is in good
position without complicating features.
IMPRESSION: L2 to L4 posterior and interbody fusion.

## 2019-07-24 SURGERY — POSTERIOR LUMBAR FUSION 2 LEVEL
Anesthesia: General | Site: Spine Lumbar

## 2019-07-24 MED ORDER — BUPIVACAINE LIPOSOME 1.3 % IJ SUSP
INTRAMUSCULAR | Status: DC | PRN
  Administered 2019-07-24: 5 mL

## 2019-07-24 MED ORDER — ONDANSETRON HCL 4 MG/2ML IJ SOLN
INTRAMUSCULAR | Status: DC | PRN
  Administered 2019-07-24: 4 mg via INTRAVENOUS

## 2019-07-24 MED ORDER — PROPOFOL 10 MG/ML IV BOLUS
INTRAVENOUS | Status: DC | PRN
  Administered 2019-07-24: 200 mg via INTRAVENOUS

## 2019-07-24 MED ORDER — OXYCODONE HCL 5 MG PO TABS
ORAL_TABLET | ORAL | Status: AC
  Filled 2019-07-24: qty 1

## 2019-07-24 MED ORDER — OXYCODONE HCL 5 MG PO TABS
5.0000 mg | ORAL_TABLET | Freq: Once | ORAL | Status: AC | PRN
  Administered 2019-07-24: 5 mg via ORAL

## 2019-07-24 MED ORDER — 0.9 % SODIUM CHLORIDE (POUR BTL) OPTIME
TOPICAL | Status: DC | PRN
  Administered 2019-07-24: 1000 mL

## 2019-07-24 MED ORDER — PROPOFOL 500 MG/50ML IV EMUL
INTRAVENOUS | Status: DC | PRN
  Administered 2019-07-24: 100 ug/kg/min via INTRAVENOUS

## 2019-07-24 MED ORDER — MIDAZOLAM HCL 2 MG/2ML IJ SOLN
INTRAMUSCULAR | Status: DC | PRN
  Administered 2019-07-24: 1 mg via INTRAVENOUS

## 2019-07-24 MED ORDER — GLYCOPYRROLATE PF 0.2 MG/ML IJ SOSY
PREFILLED_SYRINGE | INTRAMUSCULAR | Status: DC | PRN
  Administered 2019-07-24: .2 mg via INTRAVENOUS

## 2019-07-24 MED ORDER — EPHEDRINE SULFATE 50 MG/ML IJ SOLN
INTRAMUSCULAR | Status: DC | PRN
  Administered 2019-07-24: 5 mg via INTRAVENOUS
  Administered 2019-07-24: 10 mg via INTRAVENOUS

## 2019-07-24 MED ORDER — THROMBIN 20000 UNITS EX SOLR
CUTANEOUS | Status: AC
  Filled 2019-07-24: qty 20000

## 2019-07-24 MED ORDER — LIDOCAINE 2% (20 MG/ML) 5 ML SYRINGE
INTRAMUSCULAR | Status: DC | PRN
  Administered 2019-07-24: 100 mg via INTRAVENOUS

## 2019-07-24 MED ORDER — ONDANSETRON HCL 4 MG/2ML IJ SOLN: 4.0000 mg | Freq: Once | INTRAMUSCULAR | Status: DC | PRN

## 2019-07-24 MED ORDER — DEXAMETHASONE SODIUM PHOSPHATE 10 MG/ML IJ SOLN
INTRAMUSCULAR | Status: DC | PRN
  Administered 2019-07-24: 10 mg via INTRAVENOUS

## 2019-07-24 MED ORDER — BUPIVACAINE LIPOSOME 1.3 % IJ SUSP
20.0000 mL | Freq: Once | INTRAMUSCULAR | Status: DC
  Filled 2019-07-24: qty 20

## 2019-07-24 MED ORDER — BUPIVACAINE-EPINEPHRINE 0.25% -1:200000 IJ SOLN
INTRAMUSCULAR | Status: DC | PRN
  Administered 2019-07-24: 14 mL

## 2019-07-24 MED ORDER — SUGAMMADEX SODIUM 200 MG/2ML IV SOLN
INTRAVENOUS | Status: DC | PRN
  Administered 2019-07-24: 300 mg via INTRAVENOUS

## 2019-07-24 MED ORDER — DEXTROSE 5 % IV SOLN
INTRAVENOUS | Status: DC | PRN
  Administered 2019-07-24 (×2): 3 g via INTRAVENOUS

## 2019-07-24 MED ORDER — METHYLENE BLUE 0.5 % INJ SOLN
INTRAVENOUS | Status: AC
  Filled 2019-07-24: qty 10

## 2019-07-24 MED ORDER — THROMBIN 20000 UNITS EX SOLR
CUTANEOUS | Status: DC | PRN
  Administered 2019-07-24: 20 mL via TOPICAL

## 2019-07-24 MED ORDER — ACETAMINOPHEN 10 MG/ML IV SOLN
INTRAVENOUS | Status: DC | PRN
  Administered 2019-07-24: 1000 mg via INTRAVENOUS

## 2019-07-24 MED ORDER — MUPIROCIN 2 % EX OINT
1.0000 "application " | TOPICAL_OINTMENT | Freq: Two times a day (BID) | CUTANEOUS | Status: DC
  Administered 2019-07-24: 1 via NASAL
  Filled 2019-07-24: qty 22

## 2019-07-24 MED ORDER — ACETAMINOPHEN 10 MG/ML IV SOLN
INTRAVENOUS | Status: AC
  Filled 2019-07-24: qty 100

## 2019-07-24 MED ORDER — PHENYLEPHRINE HCL (PRESSORS) 10 MG/ML IV SOLN
INTRAVENOUS | Status: DC | PRN
  Administered 2019-07-24: 80 ug via INTRAVENOUS
  Administered 2019-07-24 (×2): 40 ug via INTRAVENOUS

## 2019-07-24 MED ORDER — LACTATED RINGERS IV SOLN
INTRAVENOUS | Status: DC | PRN
  Administered 2019-07-24 (×2): via INTRAVENOUS

## 2019-07-24 MED ORDER — FENTANYL CITRATE (PF) 250 MCG/5ML IJ SOLN
INTRAMUSCULAR | Status: DC | PRN
  Administered 2019-07-24 (×2): 50 ug via INTRAVENOUS
  Administered 2019-07-24: 25 ug via INTRAVENOUS
  Administered 2019-07-24 (×3): 50 ug via INTRAVENOUS
  Administered 2019-07-24: 100 ug via INTRAVENOUS

## 2019-07-24 MED ORDER — OXYCODONE HCL 5 MG/5ML PO SOLN: 5.0000 mg | Freq: Once | ORAL | Status: AC | PRN

## 2019-07-24 MED ORDER — HYDROMORPHONE HCL 1 MG/ML IJ SOLN: 0.2500 mg | INTRAMUSCULAR | Status: DC | PRN

## 2019-07-24 MED ORDER — ROCURONIUM BROMIDE 10 MG/ML (PF) SYRINGE
PREFILLED_SYRINGE | INTRAVENOUS | Status: DC | PRN
  Administered 2019-07-24: 30 mg via INTRAVENOUS
  Administered 2019-07-24 (×3): 20 mg via INTRAVENOUS
  Administered 2019-07-24: 10 mg via INTRAVENOUS
  Administered 2019-07-24 (×2): 20 mg via INTRAVENOUS
  Administered 2019-07-24: 30 mg via INTRAVENOUS

## 2019-07-24 MED ORDER — SODIUM CHLORIDE 0.9 % IV SOLN
INTRAVENOUS | Status: DC | PRN
  Administered 2019-07-24: 08:00:00 20 ug/min via INTRAVENOUS

## 2019-07-24 MED FILL — Thrombin (Recombinant) For Soln 20000 Unit: CUTANEOUS | Qty: 1 | Status: AC

## 2019-07-24 SURGICAL SUPPLY — 98 items
AGENT HMST KT MTR STRL THRMB (HEMOSTASIS)
APL SKNCLS STERI-STRIP NONHPOA (GAUZE/BANDAGES/DRESSINGS) ×1
BENZOIN TINCTURE PRP APPL 2/3 (GAUZE/BANDAGES/DRESSINGS) ×2 IMPLANT
BLADE CLIPPER SURG (BLADE) IMPLANT
BUR PRECISION FLUTE 5.0 (BURR) ×2 IMPLANT
BUR PRESCISION 1.7 ELITE (BURR) ×2 IMPLANT
BUR ROUND FLUTED 5 RND (BURR) ×1 IMPLANT
BUR ROUND FLUTED 5MM RND (BURR) ×1
BUR ROUND PRECISION 4.0 (BURR) ×1 IMPLANT
BUR ROUND PRECISION 4.0MM (BURR) ×1
BUR SABER RD CUTTING 3.0 (BURR) IMPLANT
BUR SABER RD CUTTING 3.0MM (BURR)
CARTRIDGE OIL MAESTRO DRILL (MISCELLANEOUS) ×2 IMPLANT
CLOSURE WOUND 1/2 X4 (GAUZE/BANDAGES/DRESSINGS) ×1
CONT SPEC 4OZ CLIKSEAL STRL BL (MISCELLANEOUS) ×3 IMPLANT
COVER SURGICAL LIGHT HANDLE (MISCELLANEOUS) ×1 IMPLANT
COVER WAND RF STERILE (DRAPES) ×3 IMPLANT
DIFFUSER DRILL AIR PNEUMATIC (MISCELLANEOUS) ×4 IMPLANT
DRAIN CHANNEL 15F RND FF W/TCR (WOUND CARE) ×3 IMPLANT
DRAPE C-ARM 42X72 X-RAY (DRAPES) ×3 IMPLANT
DRAPE POUCH INSTRU U-SHP 10X18 (DRAPES) ×3 IMPLANT
DRAPE SURG 17X23 STRL (DRAPES) ×12 IMPLANT
DRSG MEPILEX BORDER 4X12 (GAUZE/BANDAGES/DRESSINGS) IMPLANT
DRSG MEPILEX BORDER 4X8 (GAUZE/BANDAGES/DRESSINGS) IMPLANT
DURAPREP 26ML APPLICATOR (WOUND CARE) ×3 IMPLANT
ELECT BLADE 4.0 EZ CLEAN MEGAD (MISCELLANEOUS) ×3
ELECT BLADE 6.5 EXT (BLADE) ×2 IMPLANT
ELECT CAUTERY BLADE 6.4 (BLADE) ×6 IMPLANT
ELECT REM PT RETURN 9FT ADLT (ELECTROSURGICAL) ×3
ELECTRODE BLDE 4.0 EZ CLN MEGD (MISCELLANEOUS) ×1 IMPLANT
ELECTRODE REM PT RTRN 9FT ADLT (ELECTROSURGICAL) ×1 IMPLANT
EVACUATOR SILICONE 100CC (DRAIN) ×3 IMPLANT
FEE INTRAOP MONITOR IMPULS NCS (MISCELLANEOUS) IMPLANT
GAUZE 4X4 16PLY RFD (DISPOSABLE) ×6 IMPLANT
GAUZE SPONGE 4X4 12PLY STRL (GAUZE/BANDAGES/DRESSINGS) ×3 IMPLANT
GLOVE BIO SURGEON STRL SZ 6.5 (GLOVE) ×3 IMPLANT
GLOVE BIO SURGEON STRL SZ7 (GLOVE) ×1 IMPLANT
GLOVE BIO SURGEON STRL SZ8 (GLOVE) ×3 IMPLANT
GLOVE BIO SURGEONS STRL SZ 6.5 (GLOVE) ×3
GLOVE BIOGEL PI IND STRL 6.5 (GLOVE) IMPLANT
GLOVE BIOGEL PI IND STRL 7.0 (GLOVE) ×1 IMPLANT
GLOVE BIOGEL PI IND STRL 8 (GLOVE) ×1 IMPLANT
GLOVE BIOGEL PI INDICATOR 6.5 (GLOVE) ×4
GLOVE BIOGEL PI INDICATOR 7.0 (GLOVE)
GLOVE BIOGEL PI INDICATOR 8 (GLOVE) ×2
GOWN STRL REUS W/ TWL LRG LVL3 (GOWN DISPOSABLE) ×2 IMPLANT
GOWN STRL REUS W/ TWL XL LVL3 (GOWN DISPOSABLE) ×1 IMPLANT
GOWN STRL REUS W/TWL LRG LVL3 (GOWN DISPOSABLE) ×6
GOWN STRL REUS W/TWL XL LVL3 (GOWN DISPOSABLE) ×3
INTRAOP MONITOR FEE IMPULS NCS (MISCELLANEOUS) ×1
INTRAOP MONITOR FEE IMPULSE (MISCELLANEOUS) ×2
IV CATH 14GX2 1/4 (CATHETERS) ×3 IMPLANT
KIT BASIN OR (CUSTOM PROCEDURE TRAY) ×3 IMPLANT
KIT INFUSE SMALL (Orthopedic Implant) ×2 IMPLANT
KIT POSITION SURG JACKSON T1 (MISCELLANEOUS) ×3 IMPLANT
KIT TURNOVER KIT B (KITS) ×3 IMPLANT
MARKER SKIN DUAL TIP RULER LAB (MISCELLANEOUS) ×3 IMPLANT
NDL HYPO 25GX1X1/2 BEV (NEEDLE) ×1 IMPLANT
NDL SPNL 18GX3.5 QUINCKE PK (NEEDLE) ×2 IMPLANT
NEEDLE HYPO 25GX1X1/2 BEV (NEEDLE) ×3 IMPLANT
NEEDLE SPNL 18GX3.5 QUINCKE PK (NEEDLE) ×6 IMPLANT
NS IRRIG 1000ML POUR BTL (IV SOLUTION) ×3 IMPLANT
OIL CARTRIDGE MAESTRO DRILL (MISCELLANEOUS) ×3
PACK LAMINECTOMY ORTHO (CUSTOM PROCEDURE TRAY) ×3 IMPLANT
PACK UNIVERSAL I (CUSTOM PROCEDURE TRAY) ×3 IMPLANT
PAD ARMBOARD 7.5X6 YLW CONV (MISCELLANEOUS) ×8 IMPLANT
PATTIES SURGICAL .5 X1 (DISPOSABLE) ×1 IMPLANT
PATTIES SURGICAL .5 X3 (DISPOSABLE) IMPLANT
PATTIES SURGICAL .5X1.5 (GAUZE/BANDAGES/DRESSINGS) ×3 IMPLANT
PATTIES SURGICAL .75X.75 (GAUZE/BANDAGES/DRESSINGS) ×1 IMPLANT
PROBE PEDCLE PROBE MAGSTM DISP (MISCELLANEOUS) ×2 IMPLANT
PUTTY BONE DBX 2.5 MIS (Bone Implant) ×2 IMPLANT
PUTTY BONE DBX 5CC MIX (Putty) ×2 IMPLANT
ROD EXPEDIUM PREBENT 85MM (Rod) ×4 IMPLANT
SCREW PA CTX 10X45 (Screw) ×4 IMPLANT
SCREW POLY EXPEDIUM 5.5 8X45 (Screw) ×4 IMPLANT
SCREW SET SINGLE INNER (Screw) ×12 IMPLANT
SLEEVE SURGEON STRL (DRAPES) ×2 IMPLANT
SPONGE INTESTINAL PEANUT (DISPOSABLE) ×2 IMPLANT
SPONGE SURGIFOAM ABS GEL 100 (HEMOSTASIS) ×2 IMPLANT
STRIP CLOSURE SKIN 1/2X4 (GAUZE/BANDAGES/DRESSINGS) ×1 IMPLANT
SURGIFLO W/THROMBIN 8M KIT (HEMOSTASIS) IMPLANT
SUT MNCRL AB 4-0 PS2 18 (SUTURE) ×5 IMPLANT
SUT VIC AB 0 CT1 18XCR BRD 8 (SUTURE) ×2 IMPLANT
SUT VIC AB 0 CT1 8-18 (SUTURE) ×3
SUT VIC AB 1 CT1 18XCR BRD 8 (SUTURE) ×2 IMPLANT
SUT VIC AB 1 CT1 8-18 (SUTURE) ×6
SUT VIC AB 2-0 CT2 18 VCP726D (SUTURE) ×6 IMPLANT
SYR 20ML LL LF (SYRINGE) ×3 IMPLANT
SYR BULB IRRIGATION 50ML (SYRINGE) ×3 IMPLANT
SYR CONTROL 10ML LL (SYRINGE) ×6 IMPLANT
SYR TB 1ML LUER SLIP (SYRINGE) ×3 IMPLANT
TAPE CLOTH SURG 4X10 WHT LF (GAUZE/BANDAGES/DRESSINGS) ×2 IMPLANT
TOWEL GREEN STERILE (TOWEL DISPOSABLE) ×3 IMPLANT
TOWEL GREEN STERILE FF (TOWEL DISPOSABLE) ×3 IMPLANT
TRAY FOLEY MTR SLVR 16FR STAT (SET/KITS/TRAYS/PACK) ×3 IMPLANT
WATER STERILE IRR 1000ML POUR (IV SOLUTION) ×3 IMPLANT
YANKAUER SUCT BULB TIP NO VENT (SUCTIONS) ×3 IMPLANT

## 2019-07-24 NOTE — Transfer of Care (Signed)
Immediate Anesthesia Transfer of Care Note  Patient: Evan Morgan  Procedure(s) Performed: LUMBAR TWO- LUMBAR FOUR POSTERIOR SPINAL FUSION REVISION WITH INSTRUMENTATION AND ALOOGRAFT WITH BONE MORPHOGENTIC PROTEIN (N/A Spine Lumbar)  Patient Location: PACU  Anesthesia Type:General  Level of Consciousness: awake, alert  and oriented  Airway & Oxygen Therapy: Patient Spontanous Breathing and Patient connected to face mask oxygen  Post-op Assessment: Report given to RN and Post -op Vital signs reviewed and stable  Post vital signs: Reviewed and stable  Last Vitals:  Vitals Value Taken Time  BP 133/70 07/24/19 1225  Temp 36.1 C 07/24/19 1225  Pulse 64 07/24/19 1226  Resp 9 07/24/19 1226  SpO2 98 % 07/24/19 1226  Vitals shown include unvalidated device data.  Last Pain:  Vitals:   07/24/19 0414  TempSrc: Oral  PainSc:       Patients Stated Pain Goal: 4 (14/10/30 1314)  Complications: No apparent anesthesia complications

## 2019-07-24 NOTE — Anesthesia Procedure Notes (Signed)
Procedure Name: Intubation Date/Time: 07/24/2019 7:45 AM Performed by: Clearnce Sorrel, CRNA Pre-anesthesia Checklist: Patient identified, Emergency Drugs available, Suction available and Patient being monitored Patient Re-evaluated:Patient Re-evaluated prior to induction Oxygen Delivery Method: Circle System Utilized Preoxygenation: Pre-oxygenation with 100% oxygen Induction Type: IV induction Ventilation: Oral airway inserted - appropriate to patient size and Mask ventilation with difficulty Laryngoscope Size: Miller and 3 Grade View: Grade II Tube type: Oral Tube size: 7.5 mm Number of attempts: 1 Airway Equipment and Method: Stylet and Oral airway Placement Confirmation: ETT inserted through vocal cords under direct vision,  positive ETCO2 and breath sounds checked- equal and bilateral Secured at: 23 cm Tube secured with: Tape Dental Injury: Teeth and Oropharynx as per pre-operative assessment

## 2019-07-24 NOTE — Op Note (Signed)
PATIENT NAME: Evan Morgan   MEDICAL RECORD NO.:   [id number]    DATE OF BIRTH: 03-16-1951  DATE OF PROCEDURE: 07/24/2019                               OPERATIVE REPORT     PREOPERATIVE DIAGNOSES: 1.  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  Ongoing severe low back pain and bilateral leg pain 3.  Status post left-sided lateral L2-3 interbody fusion   POSTOPERATIVE DIAGNOSES:   1.  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  Ongoing severe low back pain and bilateral leg pain 3.  Status post left-sided lateral L2-3 interbody fusion 4.  Right-sided neuroforaminal stenosis, L3-4     PROCEDURE (Stage 2): 1. Posterior spinal fusion, L2/3, L3/4 (revision fusion at L3-4) 2. Placement of posterior segmental instrumentation,  L2, L3, L4 (with revision instrumentation placed at L3 and L4) 3. Right-sided neuroforaminal decompression, L3-4 4. Use of morselized allograft -DBX-mix. 5. Intraoperative use of floroscopy   SURGEON:  Estill Bamberg, MD   ASSISTANT:  None   ANESTHESIA:  General endotracheal anesthesia.   COMPLICATIONS:  None.   DISPOSITION:  Stable.   ESTIMATED BLOOD LOSS:  Minimal   INDICATIONS FOR SURGERY:  Briefly, patient presents today for stage II of what was to be a two-stage procedure.  Please refer to my operative report dated 07/23/2019 for a full account of the indications for surgery.  The plan was to proceed today with a revision posterior fusion procedure, with revision instrumentation at L3-4, with extension of the patient's fusion up to L2-3.  The MRI did suggest neuroforaminal stenosis on the right at L3-4.  The plan intraoperatively was to evaluate the foramen and proceed with a foraminal decompression if indicated.  Stenosis was noted,   OPERATIVE DETAILS:  On 07/24/2019, the patient was brought to surgery and general endotracheal anesthesia was administered.  The patient was placed prone onto a Jackson spinal bed.  The back was then prepped and draped  in the usual sterile fashion.  I then made paramedian incisions on the right and left sides, just lateral to the lateral borders of the pedicles at L2, L3, and L4.   A self-retaining retractor was placed immediately dorsal to the instrumentation at L3 and L4.  Extensive osteophytes were noted about the L3-4 interconnecting rod and left L3.  These were removed using an osteotome.  At this point, the L3 and L4 caps were removed, as was the interconnecting rod.  The left L3 screw was noted to be loose and was removed as well.  The L4 screw was well seated, and was maintained.  Using anatomic landmarks and fluoroscopy, I did cannulate the L2 pedicle on the left as well, and an 8 x 45 mm screw was placed.  At this point, the posterior lateral gutter at L2-3 and L3-4 was subperiosteally exposed, and a 5 mm high-speed bur was used to decorticate the transverse processes at L2, L3, and L4.  In addition, the L2-3 and L3-4 facet joints were decorticated.  At this point, the wound was copiously irrigated.  BMP in addition to DBX mix was placed along the facet joints and posterior lateral gutter from L2-L4.  At this point, a 10 x 45 mm screw was placed into the L3 pedicle, using the hole that remained after removing the previous screw.  Of note, the screw that was removed was a 10 x  50 mm screw.  The new screw was a cortical screw, which did allow additional purchase.  Excellent purchase was noted.  At this point, an 85 mm rod was placed, as were caps from L2-L4.  A final locking procedure was performed on the left side.  The wound was then closed using #1 Vicryl followed by 2-0 Vicryl followed by 4-0 Monocryl.  I then turned my attention to the patient's right side.  Once again, the previously placed hardware was identified and a self-retaining retractor was placed.  The caps and interconnecting rod was removed.  The right L4 screw was noted to be solidly in place, and was retained. The right L3 screw was removed.  This  also was 10 x 50 mm.  I then cannulated the right L2 pedicle and an 8 x 45 mm screw was placed.  At this point, I did evaluate the right L3-4 foramen.  Foraminal narrowing was noted, and I did extend a partial facetectomy on the right side, which did result in a thorough decompression of the foramen on the right at L3-4.  I was very pleased with the decompression. The facet joints and posterior lateral gutters on the right sided L2-3 and L3-4 was identified and decorticated using a 5 mm bur.  Once again, BMP and DBX mix was packed into the decorticated areas.  A 10 x 45 mm cortical screw was placed into the L3 pedicle.  An 85 mm rod was placed, followed by caps.  The caps were then locked.  Wound was copiously irrigated prior to placing the BMP and allograft. I was very pleased with the final AP and lateral fluoroscopic images.  The wound was then copiously irrigated. The fascia was then closed using #1 Vicryl.  The subcutaneous layer was closed using 0 Vicryl followed by 2-0 Vicryl, and the skin was then closed using 4-0 Monocryl. Benzoin and Steri-Strips were applied followed by sterile dressing.  All instrument counts were correct at the termination of the procedure.       Phylliss Bob, MD

## 2019-07-24 NOTE — Evaluation (Signed)
Physical Therapy Evaluation Patient Details Name: Evan Morgan MRN: [id number] DOB: 1951-09-23 Today's Date: 07/24/2019   History of Present Illness  Pt is a 68 y/o male s/p 2 step L2-4 ALIF and PLIF. MH includes HTN, asthma, and hep C.   Clinical Impression  Patient is s/p above surgery resulting in the deficits listed below (see PT Problem List). Pt very unsteady during gait and mobility tasks requiring mod to max A +2. Pt with RLE knee buckling and R lateral lean at times during gait. Pt requiring multimodal cues to maintain back precautions throughout. Feel pt is at increased risk for falls. Patient will benefit from skilled PT to increase their independence and safety with mobility (while adhering to their precautions) to allow discharge to the venue listed below.     Follow Up Recommendations Supervision/Assistance - 24 hour;Other (comment)(SNF vs HHPT )    Equipment Recommendations  None recommended by PT    Recommendations for Other Services       Precautions / Restrictions Precautions Precautions: Back Precaution Booklet Issued: Yes (comment) Precaution Comments: Reviewed back precautions with pt. Required cues to maintain during mobility tasks.  Required Braces or Orthoses: Spinal Brace Spinal Brace: Thoracolumbosacral orthotic Restrictions Weight Bearing Restrictions: No      Mobility  Bed Mobility Overal bed mobility: Needs Assistance Bed Mobility: Rolling;Sidelying to Sit;Sit to Sidelying Rolling: Mod assist Sidelying to sit: Max assist     Sit to sidelying: Mod assist General bed mobility comments: Mod A for assist with rolling to side. Cues to roll completely to side before attempting to sit. Required max A for LE assist and trunk elevation to come to sitting. Mod A for LE assist to return to sidelying. Pt required multimodal cues to maintain precautions.   Transfers Overall transfer level: Needs assistance Equipment used: Rolling walker (2  wheeled) Transfers: Sit to/from Stand Sit to Stand: Mod assist;Max assist;+2 physical assistance         General transfer comment: Mod to max A +2 for lift assist and steadying to stand. Cues to power through LEs.   Ambulation/Gait Ambulation/Gait assistance: Mod assist;+2 safety/equipment Gait Distance (Feet): 40 Feet Assistive device: Rolling walker (2 wheeled) Gait Pattern/deviations: Step-through pattern;Decreased stride length Gait velocity: Decreased   General Gait Details: Slow, very unsteady gait. Pt with R lateral lean at times and RLE buckling during gait. Required chair follow for safety. Cues for upright posture. Mod A for steadying assist throughout.   Stairs            Wheelchair Mobility    Modified Rankin (Stroke Patients Only)       Balance Overall balance assessment: Needs assistance Sitting-balance support: No upper extremity supported;Feet supported Sitting balance-Leahy Scale: Fair     Standing balance support: Bilateral upper extremity supported;During functional activity Standing balance-Leahy Scale: Poor Standing balance comment: HEAVY reliance on UE and external support                              Pertinent Vitals/Pain Pain Assessment: Faces Faces Pain Scale: Hurts whole lot Pain Location: back Pain Descriptors / Indicators: Aching;Operative site guarding Pain Intervention(s): Limited activity within patient's tolerance;Monitored during session;Repositioned    Home Living Family/patient expects to be discharged to:: Private residence Living Arrangements: Non-relatives/Friends Available Help at Discharge: Friend(s);Available PRN/intermittently Type of Home: House Home Access: Stairs to enter   [employer] of Steps: has chair lift Home Layout: One level Home Equipment:  Walker - 2 wheels;Wheelchair - manual;Shower seat      Prior Function Level of Independence: Independent with assistive device(s)          Comments: Reports only ambulating short distances with RW. Otherwise used WC.      Hand Dominance        Extremity/Trunk Assessment   Upper Extremity Assessment Upper Extremity Assessment: Defer to OT evaluation    Lower Extremity Assessment Lower Extremity Assessment: Generalized weakness;RLE deficits/detail RLE Deficits / Details: Noted RLE buckling during mobility tasks.     Cervical / Trunk Assessment Cervical / Trunk Assessment: Other exceptions Cervical / Trunk Exceptions: s/p lumbar surgery   Communication   Communication: No difficulties  Cognition Arousal/Alertness: Awake/alert Behavior During Therapy: WFL for tasks assessed/performed Overall Cognitive Status: No family/caregiver present to determine baseline cognitive functioning                                 General Comments: Pt with difficulty maintaining precautions and requiring multimodal cues. Pt also with decreased safety awareness.       General Comments General comments (skin integrity, edema, etc.): Educated about importance of mobility following surgery.     Exercises     Assessment/Plan    PT Assessment Patient needs continued PT services  PT Problem List Decreased strength;Decreased balance;Decreased activity tolerance;Decreased mobility;Decreased knowledge of use of DME;Decreased safety awareness;Decreased knowledge of precautions;Pain       PT Treatment Interventions Gait training;DME instruction;Functional mobility training;Therapeutic activities;Therapeutic exercise;Balance training;Patient/family education    PT Goals (Current goals can be found in the Care Plan section)  Acute Rehab PT Goals Patient Stated Goal: to be able to walk better PT Goal Formulation: With patient Time For Goal Achievement: 08/07/19 Potential to Achieve Goals: Good    Frequency Min 5X/week   Barriers to discharge Decreased caregiver support      Co-evaluation                AM-PAC PT "6 Clicks" Mobility  Outcome Measure Help needed turning from your back to your side while in a flat bed without using bedrails?: A Lot Help needed moving from lying on your back to sitting on the side of a flat bed without using bedrails?: Total Help needed moving to and from a bed to a chair (including a wheelchair)?: A Lot Help needed standing up from a chair using your arms (e.g., wheelchair or bedside chair)?: A Lot Help needed to walk in hospital room?: A Lot Help needed climbing 3-5 steps with a railing? : Total 6 Click Score: 10    End of Session Equipment Utilized During Treatment: Gait belt Activity Tolerance: Patient limited by pain;Patient limited by fatigue Patient left: in bed;with call bell/phone within reach Nurse Communication: Mobility status PT Visit Diagnosis: Difficulty in walking, not elsewhere classified (R26.2);Muscle weakness (generalized) (M62.81);Unsteadiness on feet (R26.81);History of falling (Z91.81);Pain Pain - part of body: (back)    Time: 7510-2585 PT Time Calculation (min) (ACUTE ONLY): 27 min   Charges:   PT Evaluation $PT Eval Moderate Complexity: 1 Mod PT Treatments $Gait Training: 8-22 mins        Leighton Ruff, PT, DPT  Acute Rehabilitation Services  Pager: [phone] Office: [phone]   Rudean Hitt 07/24/2019, 5:42 PM

## 2019-07-24 NOTE — Anesthesia Postprocedure Evaluation (Signed)
Anesthesia Post Note  Patient: Evan Morgan  Procedure(s) Performed: LUMBAR TWO- LUMBAR FOUR POSTERIOR SPINAL FUSION REVISION WITH INSTRUMENTATION AND ALOOGRAFT WITH BONE MORPHOGENTIC PROTEIN (N/A Spine Lumbar)     Patient location during evaluation: PACU Anesthesia Type: General Level of consciousness: awake and alert Pain management: pain level controlled Vital Signs Assessment: post-procedure vital signs reviewed and stable Respiratory status: spontaneous breathing, nonlabored ventilation and respiratory function stable Cardiovascular status: blood pressure returned to baseline and stable Postop Assessment: no apparent nausea or vomiting Anesthetic complications: no    Last Vitals:  Vitals:   07/24/19 1310 07/24/19 1325  BP: 129/77 136/74  Pulse: 65 62  Resp: 12 12  Temp:    SpO2: 99% 100%    Last Pain:  Vitals:   07/24/19 1325  TempSrc:   PainSc: 0-No pain                 Lidia Collum

## 2019-07-24 NOTE — H&P (Signed)
Patient tolerated stage 1 of his surgery well. Will proceed with stage 2 tomorrow as scheduled, a posterior lumbar fusion with instrumentation and allograft. Patient's history and exam is otherwise unchanged.  

## 2019-07-25 MED FILL — Heparin Sodium (Porcine) Inj 1000 Unit/ML: INTRAMUSCULAR | Qty: 30 | Status: AC

## 2019-07-25 MED FILL — Sodium Chloride IV Soln 0.9%: INTRAVENOUS | Qty: 2000 | Status: AC

## 2019-07-25 MED FILL — Sodium Chloride Irrigation Soln 0.9%: Qty: 3000 | Status: AC

## 2019-07-25 NOTE — Progress Notes (Addendum)
Physical Therapy Treatment Patient Details Name: Evan Morgan MRN: [id number] DOB: [birth year] Today's Date: 07/25/2019    History of Present Illness Pt is a 68 y/o male s/p 2 step L2-4 ALIF and PLIF. MH includes HTN, asthma, and hep C.     PT Comments    Pt progressing with post-op mobility. He was able to demonstrate transfers and ambulation with up to mod assist due to R knee buckle (1 instance). Chair follow helpful for safety. Pt open to home health therapies coming in at d/c and feel this will be helpful to maximize functional independence and safety with mobility around the home. Reinforced education on precautions, brace application/wearing schedule (adjusted brace for optimal fit), appropriate activity progression, and car transfer. Will continue to follow.     Follow Up Recommendations  Home health PT;Supervision/Assistance - 24 hour(Pt refusing SNF)     Equipment Recommendations  None recommended by PT    Recommendations for Other Services       Precautions / Restrictions Precautions Precautions: Back;Fall Precaution Booklet Issued: Yes (comment) Precaution Comments: Reviewed back precautions verbally throughout functional mobility.  Required Braces or Orthoses: Spinal Brace Spinal Brace: Thoracolumbosacral orthotic;Applied in sitting position Restrictions Weight Bearing Restrictions: No    Mobility  Bed Mobility Overal bed mobility: Needs Assistance Bed Mobility: Rolling;Sidelying to Sit Rolling: Min guard Sidelying to sit: Min assist     Sit to sidelying: Mod assist General bed mobility comments: heavy use of railing for support throughout bed mobility. HOB flat to simulate home environment, and min assist for trunk elevation to full sitting position.   Transfers Overall transfer level: Needs assistance Equipment used: Rolling walker (2 wheeled) Transfers: Sit to/from Stand Sit to Stand: Min assist;+2 physical assistance;From elevated surface          General transfer comment: MinA +2 for stability with initial standing balance. 1 hand on RW and the other on bed.  Ambulation/Gait Ambulation/Gait assistance: Mod assist;+2 safety/equipment(chair follow) Gait Distance (Feet): 150 Feet Assistive device: Rolling walker (2 wheeled) Gait Pattern/deviations: Step-through pattern;Decreased stride length;Trunk flexed;Drifts right/left Gait velocity: Decreased Gait velocity interpretation: <1.31 ft/sec, indicative of household ambulator General Gait Details: Slow and guarded with heavy UE support on the walker. Pt was frequently cued for improved posture, closer walker proximity, and forward gaze. Assist varried from min guard to 1 episode of mod assist 2 R knee buckle.    Stairs             Wheelchair Mobility    Modified Rankin (Stroke Patients Only)       Balance Overall balance assessment: Needs assistance Sitting-balance support: No upper extremity supported;Feet supported Sitting balance-Leahy Scale: Fair     Standing balance support: Bilateral upper extremity supported;During functional activity Standing balance-Leahy Scale: Poor Standing balance comment: reliance on RW                            Cognition Arousal/Alertness: Awake/alert Behavior During Therapy: WFL for tasks assessed/performed Overall Cognitive Status: Within Functional Limits for tasks assessed                                 General Comments: Pt able to maintain precautions. naming 2/3 and requiring cues for lifting restriction      Exercises      General Comments        Pertinent Vitals/Pain Pain Assessment: Faces Faces  Pain Scale: Hurts little more Pain Location: back Pain Descriptors / Indicators: Operative site guarding;Grimacing Pain Intervention(s): Limited activity within patient's tolerance;Monitored during session;Repositioned    Home Living Family/patient expects to be discharged to:: Private  residence Living Arrangements: Non-relatives/Friends Available Help at Discharge: Friend(s);Available PRN/intermittently Type of Home: House Home Access: Stairs to enter   Home Layout: One level Home Equipment: Environmental consultant - 2 wheels;Wheelchair - manual;Shower seat;Grab bars - toilet;Grab bars - tub/shower;Adaptive equipment      Prior Function Level of Independence: Independent with assistive device(s)      Comments: Reports only ambulating short distances with RW. Otherwise used WC.    PT Goals (current goals can now be found in the care plan section) Acute Rehab PT Goals Patient Stated Goal: Home today PT Goal Formulation: With patient Time For Goal Achievement: 08/07/19 Potential to Achieve Goals: Good    Frequency    Min 5X/week      PT Plan Current plan remains appropriate    Co-evaluation PT/OT/SLP Co-Evaluation/Treatment: Yes Reason for Co-Treatment: For patient/therapist safety;To address functional/ADL transfers PT goals addressed during session: Mobility/safety with mobility;Balance;Proper use of DME        AM-PAC PT "6 Clicks" Mobility   Outcome Measure  Help needed turning from your back to your side while in a flat bed without using bedrails?: A Little Help needed moving from lying on your back to sitting on the side of a flat bed without using bedrails?: A Little Help needed moving to and from a bed to a chair (including a wheelchair)?: A Little Help needed standing up from a chair using your arms (e.g., wheelchair or bedside chair)?: A Little Help needed to walk in hospital room?: A Little Help needed climbing 3-5 steps with a railing? : Total 6 Click Score: 16    End of Session Equipment Utilized During Treatment: Gait belt Activity Tolerance: Patient limited by pain;Patient limited by fatigue Patient left: in bed;with call bell/phone within reach Nurse Communication: Mobility status PT Visit Diagnosis: Difficulty in walking, not elsewhere classified  (R26.2);Muscle weakness (generalized) (M62.81);Unsteadiness on feet (R26.81);History of falling (Z91.81);Pain Pain - part of body: (back)     Time: 6269-4854 PT Time Calculation (min) (ACUTE ONLY): 32 min  Charges:  $Gait Training: 8-22 mins                     Rolinda Roan, PT, DPT Acute Rehabilitation Services Pager: [phone] Office: [phone]    Thelma Comp 07/25/2019, 10:33 AM

## 2019-07-25 NOTE — Progress Notes (Signed)
Physical Therapy Treatment Patient Details Name: Evan Morgan MRN: [id number] DOB: October 02, 1951 Today's Date: 07/25/2019    History of Present Illness Pt is a 68 y/o male s/p 2 step L2-4 ALIF and PLIF. MH includes HTN, asthma, and hep C.     PT Comments    Pt progressing towards goals, however, continues to present with RLE knee instability. Pt buckling X2, requiring mod A for stability. Otherwise requiring min A for mobility with RW. Pt very adamant about returning home this afternoon. Reviewed back precautions and need for assist. Will continue to follow acutely to maximize functional mobility independence and safety.     Follow Up Recommendations  Home health PT;Supervision/Assistance - 24 hour(refusing SNF )     Equipment Recommendations  None recommended by PT    Recommendations for Other Services       Precautions / Restrictions Precautions Precautions: Back;Fall Precaution Booklet Issued: Yes (comment) Precaution Comments: Pt recalled 2/3 precautions. Required cues for no lifting equipment.  Required Braces or Orthoses: Spinal Brace Spinal Brace: Thoracolumbosacral orthotic;Applied in sitting position Restrictions Weight Bearing Restrictions: No    Mobility  Bed Mobility Overal bed mobility: Needs Assistance Bed Mobility: Rolling;Sidelying to Sit Rolling: Min guard Sidelying to sit: Min assist       General bed mobility comments: Heavy use of rail for rolling and assist with trunk elevation. Increased time required and external assist required for trunk elevation.   Transfers Overall transfer level: Needs assistance Equipment used: Rolling walker (2 wheeled) Transfers: Sit to/from Stand Sit to Stand: Mod assist;From elevated surface         General transfer comment: Mod A for lift assist from elevated surface. Pt pulling up on RW and required bracing of RW to prevent tipping.   Ambulation/Gait Ambulation/Gait assistance: Min assist;Mod assist Gait  Distance (Feet): 50 Feet Assistive device: Rolling walker (2 wheeled) Gait Pattern/deviations: Step-through pattern;Decreased stride length;Trunk flexed;Drifts right/left Gait velocity: Decreased Gait velocity interpretation: <1.31 ft/sec, indicative of household ambulator General Gait Details: Pt continues to present with RLE instability and buckled X2 this session. Pt requiring mod A when RLE buckled. Required cues for upright posture and proximity to device. Heavy reliance on UE support    Stairs             Wheelchair Mobility    Modified Rankin (Stroke Patients Only)       Balance Overall balance assessment: Needs assistance Sitting-balance support: No upper extremity supported;Feet supported Sitting balance-Leahy Scale: Fair     Standing balance support: Bilateral upper extremity supported;During functional activity Standing balance-Leahy Scale: Poor Standing balance comment: reliance on RW                            Cognition Arousal/Alertness: Awake/alert Behavior During Therapy: WFL for tasks assessed/performed Overall Cognitive Status: Within Functional Limits for tasks assessed                                        Exercises      General Comments        Pertinent Vitals/Pain Pain Assessment: Faces Faces Pain Scale: Hurts little more Pain Location: back Pain Descriptors / Indicators: Operative site guarding;Grimacing Pain Intervention(s): Monitored during session;Limited activity within patient's tolerance;Repositioned    Home Living  Prior Function            PT Goals (current goals can now be found in the care plan section) Acute Rehab PT Goals Patient Stated Goal: Home today PT Goal Formulation: With patient Time For Goal Achievement: 08/07/19 Potential to Achieve Goals: Good Progress towards PT goals: Progressing toward goals    Frequency    Min 5X/week      PT Plan  Current plan remains appropriate    Co-evaluation              AM-PAC PT "6 Clicks" Mobility   Outcome Measure  Help needed turning from your back to your side while in a flat bed without using bedrails?: A Little Help needed moving from lying on your back to sitting on the side of a flat bed without using bedrails?: A Little Help needed moving to and from a bed to a chair (including a wheelchair)?: A Little Help needed standing up from a chair using your arms (e.g., wheelchair or bedside chair)?: A Lot Help needed to walk in hospital room?: A Little Help needed climbing 3-5 steps with a railing? : Total 6 Click Score: 15    End of Session Equipment Utilized During Treatment: Gait belt Activity Tolerance: Patient limited by pain;Patient limited by fatigue Patient left: in bed;with call bell/phone within reach(sitting EOB ) Nurse Communication: Mobility status PT Visit Diagnosis: Difficulty in walking, not elsewhere classified (R26.2);Muscle weakness (generalized) (M62.81);Unsteadiness on feet (R26.81);History of falling (Z91.81);Pain Pain - part of body: (back)     Time: 6761-9509 PT Time Calculation (min) (ACUTE ONLY): 20 min  Charges:  $Gait Training: 8-22 mins                     [name], PT, DPT  Acute Rehabilitation Services  Pager: [phone] Office: [phone]    [name] 07/25/2019, 4:16 PM

## 2019-07-25 NOTE — Evaluation (Signed)
Occupational Therapy Evaluation Patient Details Name: Evan Morgan MRN: [id number] DOB: 08/05/1951 Today's Date: 07/25/2019    History of Present Illness Pt is a 68 y/o male s/p 2 step L2-4 ALIF and PLIF. MH includes HTN, asthma, and hep C.    Clinical Impression   Pt PTA: Pt living alone mostly with a friend to help as needed. Pt was using w/c mostly for mobility and minimally using RW. Pt currently pt able to maintain precautions. Pt naming 2/3 and requiring cues for lifting restriction. Pt set-upA for UB ADL and ModA to maxA for LB ADL. Pt education on hip kit for LB needs. Pt with good carry over skills to accomplish BLE dressing. Pt performing grooming in sitting today. Pt given hip kit for use at home. Back handout provided and reviewed ADL in detail. Pt educated on: clothing between brace, never sleep in brace, set an alarm at night for medication, avoid sitting for long periods of time, correct bed positioning for sleeping, correct sequence for bed mobility, avoiding lifting more than 5 pounds and never wash directly over incision. OT following acutely for LB dress and OOB ADL.      Follow Up Recommendations  Home health OT;Supervision/Assistance - 24 hour    Equipment Recommendations  None recommended by OT    Recommendations for Other Services       Precautions / Restrictions Precautions Precautions: Back Precaution Booklet Issued: Yes (comment) Precaution Comments: Reviewed back precautions with pt. Required cues to maintain during mobility tasks.  Required Braces or Orthoses: Spinal Brace Spinal Brace: Thoracolumbosacral orthotic Restrictions Weight Bearing Restrictions: No      Mobility Bed Mobility Overal bed mobility: Needs Assistance Bed Mobility: Rolling;Sidelying to Sit;Sit to Sidelying Rolling: Min guard Sidelying to sit: Min assist     Sit to sidelying: Mod assist General bed mobility comments: minguardA for rolling with use of rail; MinA + heavy rail  use for bring LLE onto off of bed and for trunk elevation; modA for BLEs transfer to bed  Transfers Overall transfer level: Needs assistance Equipment used: Rolling walker (2 wheeled) Transfers: Sit to/from Stand Sit to Stand: Min assist;+2 physical assistance;From elevated surface         General transfer comment: MinA +2 for stability with initial standing balance. 1 hand on RW and the other on bed.    Balance Overall balance assessment: Needs assistance Sitting-balance support: No upper extremity supported;Feet supported Sitting balance-Leahy Scale: Fair     Standing balance support: Bilateral upper extremity supported;During functional activity Standing balance-Leahy Scale: Poor Standing balance comment: reliance on RW                           ADL either performed or assessed with clinical judgement   ADL Overall ADL's : Needs assistance/impaired Eating/Feeding: Modified independent   Grooming: Set up;Sitting Grooming Details (indicate cue type and reason): bathroom too small to simulate grooming at sink Upper Body Bathing: Set up;Sitting   Lower Body Bathing: Moderate assistance;Sitting/lateral leans;Sit to/from stand;With adaptive equipment   Upper Body Dressing : Set up;Sitting   Lower Body Dressing: Moderate assistance;Sitting/lateral leans;Sit to/from stand;Cueing for safety;With adaptive equipment   Toilet Transfer: Minimal assistance;Ambulation;RW   Toileting- Clothing Manipulation and Hygiene: Maximal assistance;Sitting/lateral lean;Sit to/from stand;Cueing for safety       Functional mobility during ADLs: Minimal assistance;Rolling walker;Cueing for safety General ADL Comments: Pt set-upA for UB ADL and ModA to maxA for LB ADL. Pt education  on hip kit for LB needs. Pt with good carry over skills to accomplish BLE dressing. Pt performing grooming in sitting today. Pt given hip kit for use at home.     Vision Baseline Vision/History: Wears  glasses Wears Glasses: Reading only Patient Visual Report: No change from baseline Vision Assessment?: No apparent visual deficits     Perception     Praxis      Pertinent Vitals/Pain Pain Assessment: Faces Faces Pain Scale: Hurts little more Pain Location: back Pain Descriptors / Indicators: Aching;Operative site guarding Pain Intervention(s): Limited activity within patient's tolerance     Hand Dominance Right   Extremity/Trunk Assessment Upper Extremity Assessment Upper Extremity Assessment: Overall WFL for tasks assessed   Lower Extremity Assessment Lower Extremity Assessment: Generalized weakness RLE Deficits / Details: Noted RLE buckling x2 times during walk   Cervical / Trunk Assessment Cervical / Trunk Assessment: Other exceptions Cervical / Trunk Exceptions: s/p lumbar surgery    Communication Communication Communication: No difficulties   Cognition Arousal/Alertness: Awake/alert Behavior During Therapy: WFL for tasks assessed/performed Overall Cognitive Status: Within Functional Limits for tasks assessed                                 General Comments: Pt able to maintain precautions. naming 2/3 and requiring cues for lifting restriction   General Comments       Exercises     Shoulder Instructions      Home Living Family/patient expects to be discharged to:: Private residence Living Arrangements: Non-relatives/Friends Available Help at Discharge: Friend(s);Available PRN/intermittently Type of Home: House Home Access: Stairs to enter [employer] of Steps: has chair lift   Home Layout: One level     Bathroom Shower/Tub: Tub/shower unit;Walk-in shower   Bathroom Toilet: Handicapped height     Home Equipment: Environmental consultant - 2 wheels;Wheelchair - manual;Shower seat;Grab bars - toilet;Grab bars - tub/shower;Adaptive equipment Adaptive Equipment: Reacher        Prior Functioning/Environment Level of Independence: Independent  with assistive device(s)        Comments: Reports only ambulating short distances with RW. Otherwise used WC.         OT Problem List: Decreased strength;Decreased activity tolerance;Impaired balance (sitting and/or standing);Decreased safety awareness;Pain      OT Treatment/Interventions: Self-care/ADL training;Therapeutic exercise;Energy conservation;DME and/or AE instruction;Therapeutic activities;Patient/family education    OT Goals(Current goals can be found in the care plan section) Acute Rehab OT Goals Patient Stated Goal: to be able to walk better OT Goal Formulation: With patient Time For Goal Achievement: 08/08/19 Potential to Achieve Goals: Good ADL Goals Pt Will Perform Grooming: with supervision;standing Pt Will Perform Lower Body Dressing: with supervision;with adaptive equipment;sitting/lateral leans;sit to/from stand Pt Will Perform Toileting - Clothing Manipulation and hygiene: with supervision;sitting/lateral leans  OT Frequency: Min 2X/week   Barriers to D/C:            Co-evaluation              AM-PAC OT "6 Clicks" Daily Activity     Outcome Measure Help from another person eating meals?: None Help from another person taking care of personal grooming?: A Little Help from another person toileting, which includes using toliet, bedpan, or urinal?: A Lot Help from another person bathing (including washing, rinsing, drying)?: A Lot Help from another person to put on and taking off regular upper body clothing?: A Little Help from another person to put on and  taking off regular lower body clothing?: A Lot 6 Click Score: 16   End of Session Equipment Utilized During Treatment: Gait belt;Rolling walker;Back brace Nurse Communication: Mobility status  Activity Tolerance: Patient tolerated treatment well;Patient limited by pain Patient left: in bed;with call bell/phone within reach  OT Visit Diagnosis: Unsteadiness on feet (R26.81);Muscle weakness  (generalized) (M62.81)                Time: 8676-1950 OT Time Calculation (min): 47 min Charges:  OT General Charges $OT Visit: 1 Visit OT Evaluation $OT Eval Moderate Complexity: 1 Mod OT Treatments $Self Care/Home Management : 8-22 mins  Ebony Hail Harold Hedge) Marsa Aris OTR/L Acute Rehabilitation Services Pager: [phone] Office: [place] 07/25/2019, 9:23 AM

## 2019-07-25 NOTE — Progress Notes (Signed)
    Patient progressing slowly Has expected LBP - improved from yesterday Ambulating with walker   Physical Exam: Vitals:   07/24/19 2354 07/25/19 0316  BP: (!) 148/81 (!) 156/82  Pulse: 60 61  Resp: 20 20  Temp: 97.9 F (36.6 C) 98.2 F (36.8 C)  SpO2: 96% 97%    Dressing in place NVI  POD s/p A/P fusion, doing well, with expected pain and limitations  - up with PT/OT, encourage ambulation - Percocet for pain, Valium for muscle spasms - likely d/c home today with f/u in 2 weeks, depending on PT progress

## 2019-07-25 NOTE — Progress Notes (Signed)
Pt was cleared for discharge; was wheeled down with belongings; no acute complaints of pain nor discomfort; discharge instructions given by RN, pt verbalized understanding.

## 2019-07-25 NOTE — TOC Initial Note (Signed)
Transition of Care [facility]) - Initial/Assessment Note    Patient Details  Name: Evan Morgan MRN: [id number] Date of Birth: 1950-12-01  [facility] CM/SW Contact:    Benard Halsted, LCSW Phone Number: 07/25/2019, 10:24 AM  Clinical Narrative:                 CSW received consult for possible home health services at time of discharge. CSW spoke with patient regarding PT recommendation of Home Health PT/OT at time of discharge. Patient reported that he would like home health services. Patient reports preference for [facility] since his wife (passed away this year) had used them before and liked their care. CSW sent referral for review; referral was accepted. CSW provided Medicare [facility] ratings list. CSW confirmed PCP and address with patient. Patient states his significant other will come pick him up at discharge. No further questions reported at this time.    Expected Discharge Plan: [place] Barriers to Discharge: No Barriers Identified   Patient Goals and CMS Choice Patient states their goals for this hospitalization and ongoing recovery are:: Get stronger CMS Medicare.gov Compare Post Acute Care list provided to:: Patient Choice offered to / list presented to : Patient  Expected Discharge Plan and Services Expected Discharge Plan: [place] In-house Referral: NA Discharge Planning Services: CM Consult Post Acute Care Choice: [place] arrangements for the past 2 months: Single Family Home                 DME Arranged: N/A         HH Arranged: PT, OT [place] Agency: [place] ([place]) Date [place]: 07/25/19 Time [place]: 1021 Representative spoke with at [place]: [place] Arrangements/Services Living arrangements for the past 2 months: [place] with:: Significant Other Patient language and need for interpreter reviewed:: Yes Do you feel safe  going back to the place where you live?: Yes      Need for Family Participation in Patient Care: No (Comment) Care giver support system in place?: Yes (comment) Current home services: DME Criminal Activity/Legal Involvement Pertinent to Current Situation/Hospitalization: No - Comment as needed  Activities of Daily Living   ADL Screening (condition at time of admission) Is the patient deaf or have difficulty hearing?: No Does the patient have difficulty seeing, even when wearing glasses/contacts?: No Does the patient have difficulty concentrating, remembering, or making decisions?: No Does the patient have difficulty dressing or bathing?: Yes Does the patient have difficulty walking or climbing stairs?: Yes  Permission Sought/Granted Permission sought to share information with : Facility Art therapist granted to share information with : Yes, Verbal Permission Granted     Permission granted to share info w AGENCY: Home Health        Emotional Assessment Appearance:: Appears stated age Attitude/Demeanor/Rapport: Gracious Affect (typically observed): Accepting, Appropriate, Pleasant Orientation: : Oriented to Self, Oriented to Place, Oriented to  Time, Oriented to Situation Alcohol / Substance Use: Not Applicable Psych Involvement: No (comment)  Admission diagnosis:  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 Radiculopathy 07/23/2019   PCP:  Charlsie Merles, MD Pharmacy:   [facility], [address] [place] [address] Phone: [phone] Fax: [phone]     Social Determinants of Health (SDOH)  Interventions    Readmission Risk Interventions No flowsheet data found.

## 2019-07-27 NOTE — Anesthesia Postprocedure Evaluation (Signed)
Anesthesia Post Note  Patient: Evan Morgan  Procedure(s) Performed: LUMBAR TWO-THREE LEFT LATERAL INTERBODY FUSION WITH INSTRUMENTATION AND ALLOGRAFT     Anesthesia Post Evaluation  Last Vitals:  Vitals:   07/25/19 1415 07/25/19 1653  BP: 118/69 120/61  Pulse: 68 67  Resp: 16 16  Temp: 36.9 C 37.2 C  SpO2: 96% 98%    Last Pain:  Vitals:   07/25/19 1703  TempSrc:   PainSc: 2                  Jeronimo Hellberg

## 2019-07-30 NOTE — Discharge Summary (Signed)
Patient ID: Evan Morgan MRN: [id number] DOB/AGE: 66/06/1951 66 y.o.  Admit date: 07/23/2019 Discharge date: 07/24/2019  Admission Diagnoses:  Active Problems:   Radiculopathy   Discharge Diagnoses:  Same  Past Medical History:  Diagnosis Date  . Asthma    As a child  . History of hepatitis C   . Hypertension     Surgeries: Procedure(s): LUMBAR TWO- LUMBAR FOUR POSTERIOR SPINAL FUSION REVISION WITH INSTRUMENTATION AND ALOOGRAFT WITH BONE MORPHOGENTIC PROTEIN on 07/24/2019   Consultants: None  Discharged Condition: Improved  Hospital Course: Evan Morgan is an 68 y.o. male who was admitted 07/23/2019 for operative treatment of radiculopathy. Patient has severe unremitting pain that affects sleep, daily activities, and work/hobbies. After pre-op clearance the patient was taken to the operating room on 07/24/2019 and underwent  Procedure(s): LUMBAR TWO- LUMBAR FOUR POSTERIOR SPINAL FUSION REVISION WITH INSTRUMENTATION AND ALOOGRAFT WITH BONE MORPHOGENTIC PROTEIN.    Patient was given perioperative antibiotics:  Anti-infectives (From admission, onward)   Start     Dose/Rate Route Frequency Ordered Stop   07/23/19 1630  ceFAZolin (ANCEF) IVPB 2g/100 mL premix     2 g 200 mL/hr over 30 Minutes Intravenous Every 8 hours 07/23/19 1339 07/23/19 2347   07/23/19 0730  ceFAZolin (ANCEF) 3 g in dextrose 5 % 50 mL IVPB     3 g 100 mL/hr over 30 Minutes Intravenous To ShortStay Surgical 07/22/19 1414 07/23/19 0830       Patient was given sequential compression devices, early ambulation to prevent DVT.  Patient benefited maximally from hospital stay and there were no complications.    Recent vital signs: BP 120/61 (BP Location: Right Arm)   Pulse 67   Temp 98.9 F (37.2 C) (Oral)   Resp 16   Ht 5\' 11"  (1.803 m)   Wt 127 kg   SpO2 98%   BMI 39.05 kg/m    Discharge Medications:   Allergies as of 07/25/2019      Reactions   Amlodipine Other (See Comments)   Unknown  reaction    Codeine Nausea And Vomiting   Lisinopril Cough      Medication List    STOP taking these medications   acetaminophen 500 MG tablet Commonly known as: TYLENOL     TAKE these medications   atenolol 50 MG tablet Commonly known as: TENORMIN Take 50 mg by mouth daily.   finasteride 5 MG tablet Commonly known as: PROSCAR Take 5 mg by mouth daily.   gabapentin 300 MG capsule Commonly known as: NEURONTIN Take 900 mg by mouth 2 (two) times daily.   hydrALAZINE 10 MG tablet Commonly known as: APRESOLINE Take 10 mg by mouth 2 (two) times daily.   hydrochlorothiazide 25 MG tablet Commonly known as: HYDRODIURIL Take 25 mg by mouth daily.   lidocaine 4 % cream Commonly known as: LMX Apply 1 application topically as needed (legs).   tamsulosin 0.4 MG Caps capsule Commonly known as: FLOMAX Take 0.4 mg by mouth daily.   Vitamin D-1000 Max St 25 MCG (1000 UT) tablet Generic drug: Cholecalciferol Take 2,000 Units by mouth daily.       Diagnostic Studies: Dg Lumbar Spine 2-3 Views  Result Date: 07/24/2019 CLINICAL DATA:  Revision of L3-4 fusion and new L2-3 fusion. EXAM: LUMBAR SPINE - 2-3 VIEW; DG C-ARM 1-60 MIN COMPARISON:  Lumbar spine MRI 06/07/2019 FINDINGS: Intraoperative fluoroscopic spot images demonstrate pedicle screws and posterior rods fusing L2-3 and L3-4. The hardware is in good  position without complicating features. IMPRESSION: L2 to L4 posterior and interbody fusion. Electronically Signed   By: Marijo Sanes M.D.   On: 07/24/2019 11:53   Dg Lumbar Spine 2-3 Views  Result Date: 07/23/2019 CLINICAL DATA:  L2-3 XLIF. EXAM: LUMBAR SPINE - 2-3 VIEW; DG C-ARM 1-60 MIN COMPARISON:  MRI lumbar spine 06/07/2019 FINDINGS: Previous lumbar fusion at L3-4 is stable. Knee discectomy and spacer is present at the L2-3 level. Alignment is anatomic. Disc height is improved. IMPRESSION: Interval discectomy at L2-3 without radiographic evidence for complication.  Electronically Signed   By: San Morelle M.D.   On: 07/23/2019 11:40   Dg C-arm 1-60 Min  Result Date: 07/24/2019 CLINICAL DATA:  Revision of L3-4 fusion and new L2-3 fusion. EXAM: LUMBAR SPINE - 2-3 VIEW; DG C-ARM 1-60 MIN COMPARISON:  Lumbar spine MRI 06/07/2019 FINDINGS: Intraoperative fluoroscopic spot images demonstrate pedicle screws and posterior rods fusing L2-3 and L3-4. The hardware is in good position without complicating features. IMPRESSION: L2 to L4 posterior and interbody fusion. Electronically Signed   By: Marijo Sanes M.D.   On: 07/24/2019 11:53   Dg C-arm 1-60 Min  Result Date: 07/23/2019 CLINICAL DATA:  L2-3 XLIF. EXAM: LUMBAR SPINE - 2-3 VIEW; DG C-ARM 1-60 MIN COMPARISON:  MRI lumbar spine 06/07/2019 FINDINGS: Previous lumbar fusion at L3-4 is stable. Knee discectomy and spacer is present at the L2-3 level. Alignment is anatomic. Disc height is improved. IMPRESSION: Interval discectomy at L2-3 without radiographic evidence for complication. Electronically Signed   By: San Morelle M.D.   On: 07/23/2019 11:40    Disposition:    POD s/p A/P fusion, doing well, with expected pain and limitations  - up with PT/OT, encourage ambulation - Percocet for pain, Valium for muscle spasms -Scripts for pain sent to pharmacy electronically  -D/C instructions sheet printed and in chart -D/C today  -F/U in office 2 weeks   Signed: Lennie Muckle Delawrence Fridman 07/30/2019, 10:43 AM

## 2019-10-31 ENCOUNTER — Other Ambulatory Visit: Payer: Self-pay | Admitting: Orthopedic Surgery

## 2019-10-31 DIAGNOSIS — M25512 Pain in left shoulder: Secondary | ICD-10-CM

## 2019-11-29 ENCOUNTER — Inpatient Hospital Stay: Admission: RE | Admit: 2019-11-29 | Payer: Medicare Other | Source: Ambulatory Visit

## 2019-12-24 ENCOUNTER — Other Ambulatory Visit: Payer: Medicare Other

## 2020-01-10 ENCOUNTER — Ambulatory Visit
Admission: RE | Admit: 2020-01-10 | Discharge: 2020-01-10 | Disposition: A | Discharge status: Home / Self Care | Payer: Medicare Other | Source: Ambulatory Visit | Attending: Orthopedic Surgery | Admitting: Orthopedic Surgery

## 2020-01-10 ENCOUNTER — Other Ambulatory Visit: Payer: Self-pay

## 2020-01-10 DIAGNOSIS — M25512 Pain in left shoulder: Secondary | ICD-10-CM

## 2020-01-10 IMAGING — MR MR SHOULDER*L* W/O CM
4 of 5 series · 20 of 40 positions shown · non-contrast
Comparison: None.

CLINICAL DATA: Left shoulder pain, fall from wheelchair 7 months
ago. Weakness.

EXAM:
MRI OF THE LEFT SHOULDER WITHOUT CONTRAST
TECHNIQUE: Multiplanar, multisequence MR imaging of the shoulder was performed.
No intravenous contrast was administered.

[Series 6: PD fat-sat · axial · left · 4.0mm · 0.47mm/px · z∈[-94,+12]mm · 8 of 24 slices shown (1 of 2)]
[im 1/24]
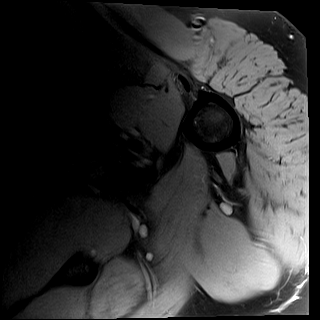
[im 4/24]
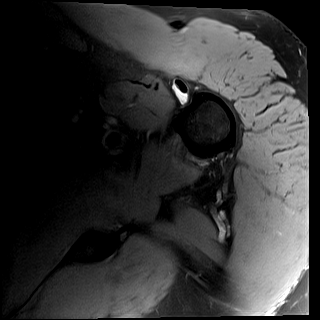
[im 7/24]
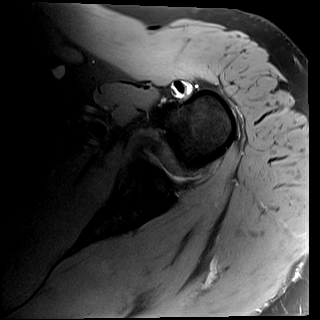
[im 10/24]
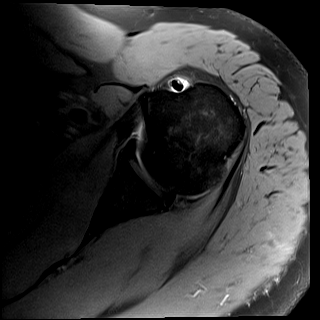
[im 14/24]
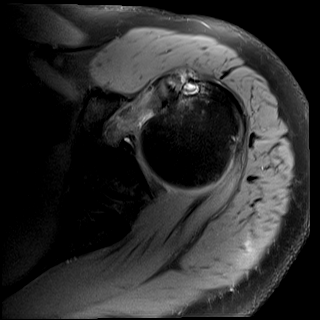
[im 17/24]
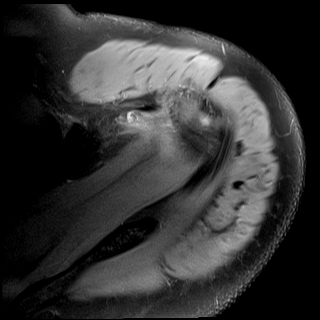
[im 20/24]
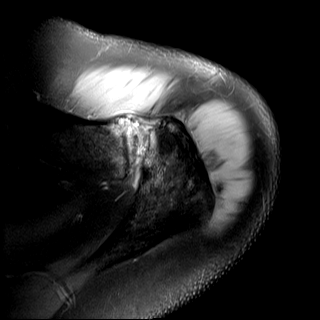
[im 24/24]
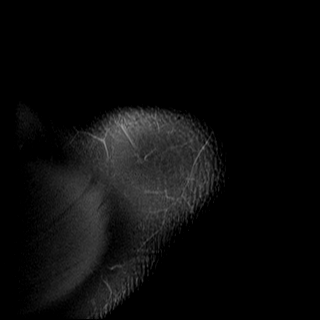

[Series 7: T2 fat-sat · oblique · left · 4.0mm · 0.22mm/px · 3 of 21 slices shown (1 of 2)]
[im 3/21]
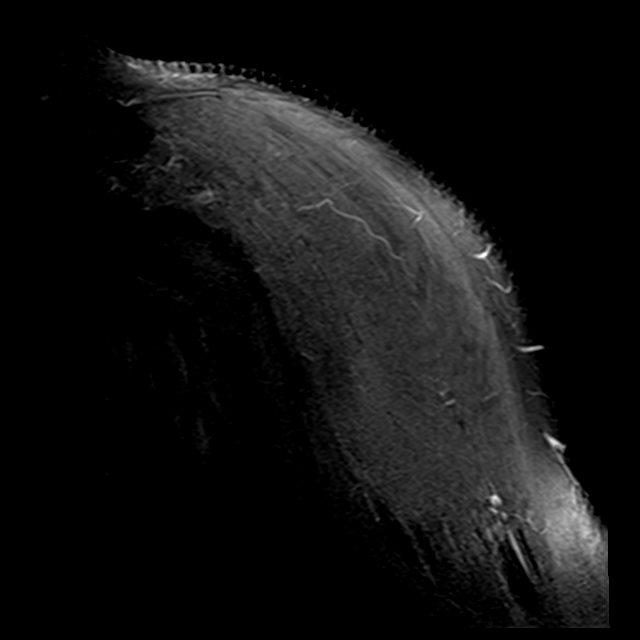
[im 12/21]
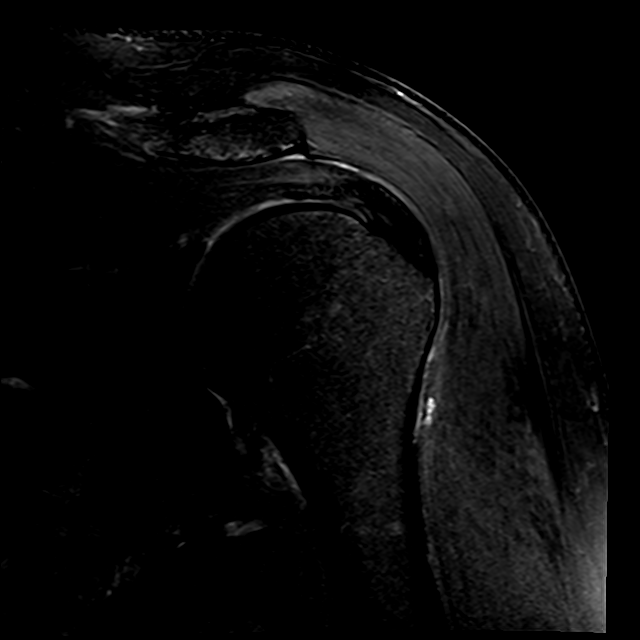
[im 18/21]
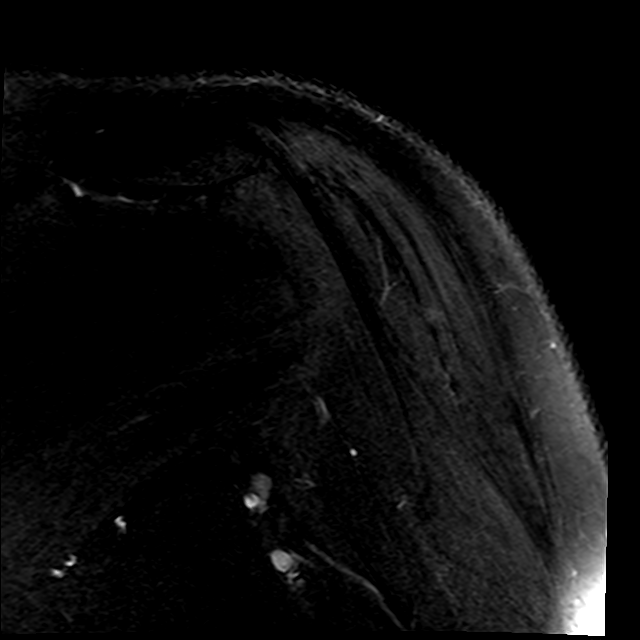

[Series 8: PD fat-sat · oblique · left · 4.0mm · 0.22mm/px · 6 of 21 slices shown (2 of 2)]
[im 1/21]
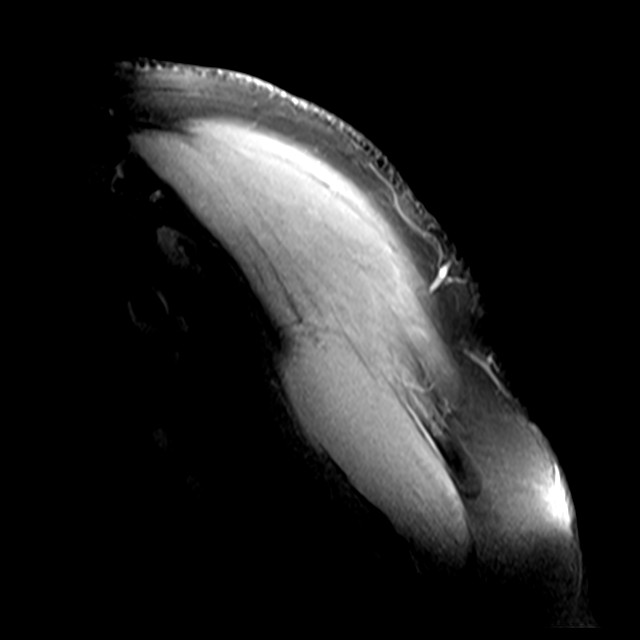
[im 3/21]
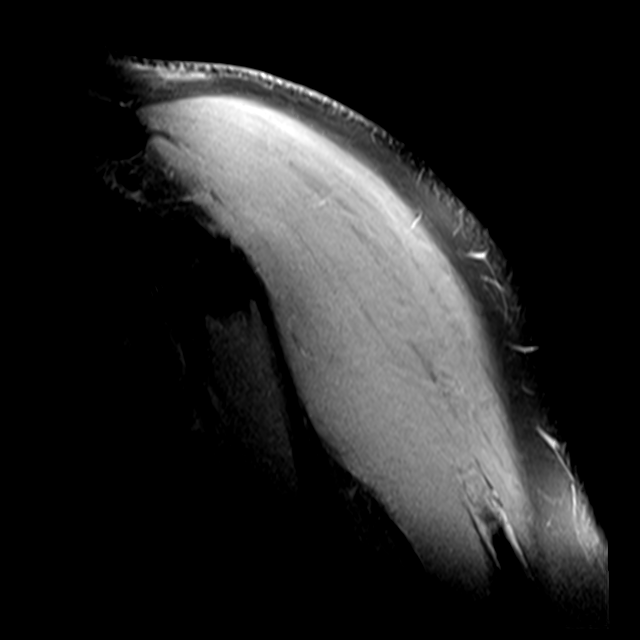
[im 6/21]
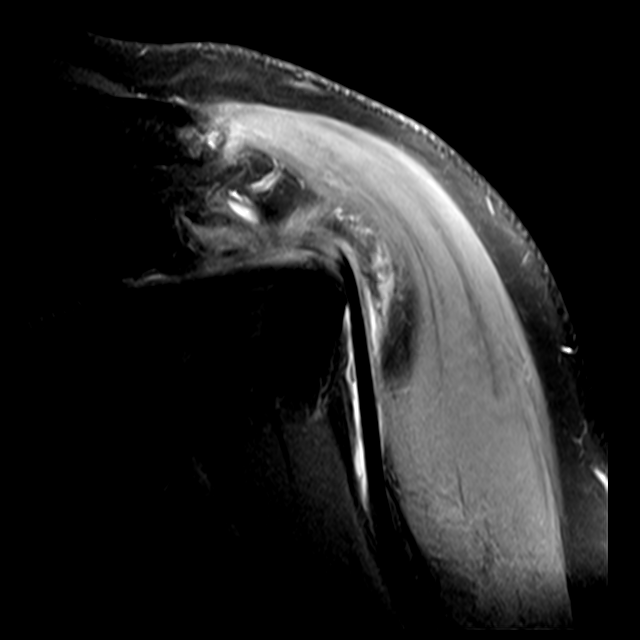
[im 9/21]
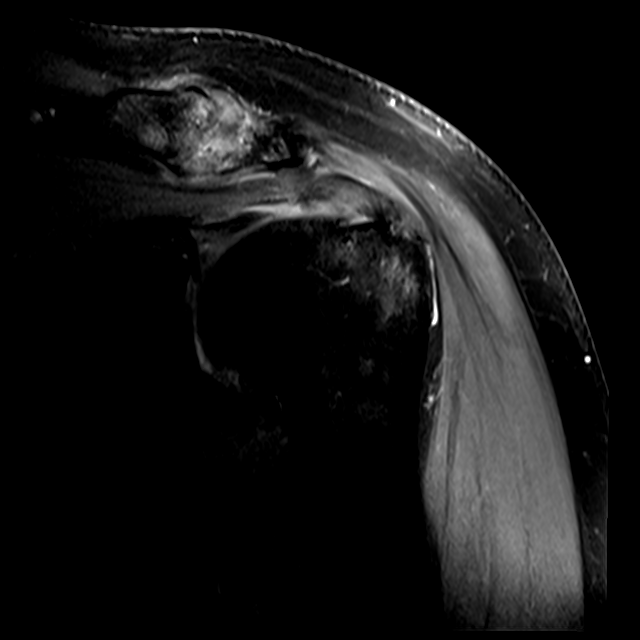
[im 12/21]
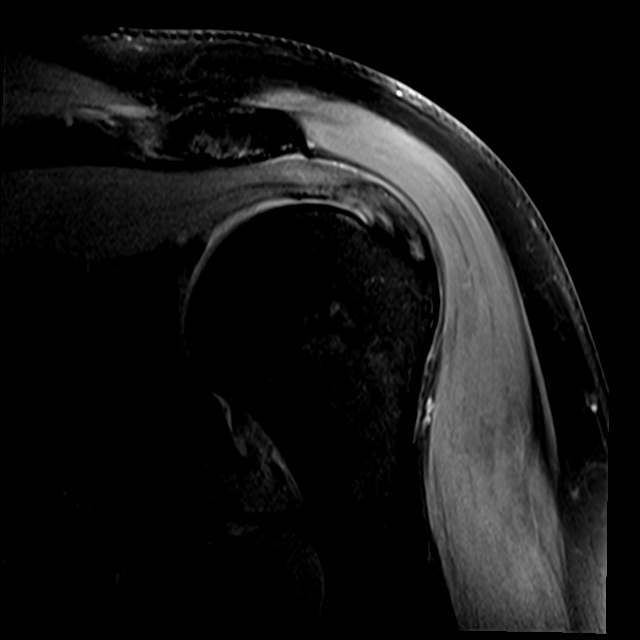
[im 18/21]
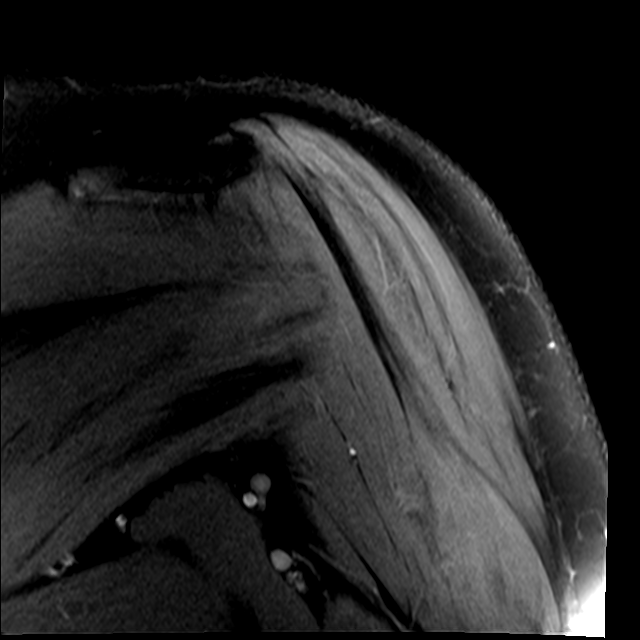

[Series 9: T2 fat-sat · oblique · left · 4.0mm · 0.44mm/px · 3 of 23 slices shown (2 of 2)]
[im 4/23]
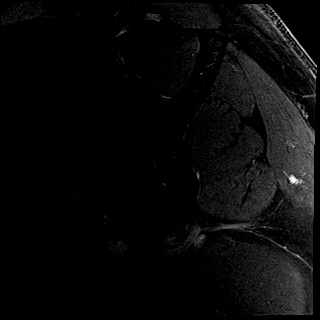
[im 13/23]
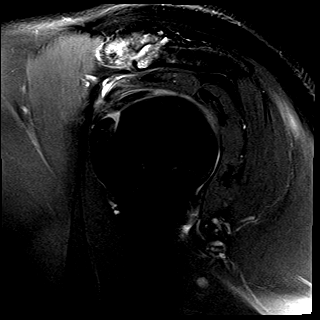
[im 19/23]
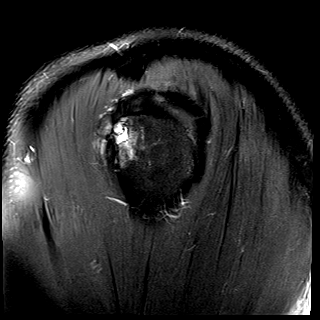

[20 of 40 positions shown; findings below may reference images not displayed]

FINDINGS: Rotator cuff:  Mild supraspinatus tendinopathy anteriorly.

Muscles: Mild fatty atrophy of the teres minor muscle may reflect
remote quadrilateral space syndrome.

Biceps long head:  Partially torn intra-articular segment.

Acromioclavicular Joint: Moderate spurring with considerable
subcortical marrow edema fluid signal within the joint compatible
moderate AC joint arthropathy. Type II acromion. No significant
regional bursitis.

Glenohumeral Joint: Subtle subcortical marrow edema along the
superior glenoid for example on image [DATE], likely degenerative.

There is thickening of the coracohumeral ligament with some
synovitis along the rotator interval. Mild thickening of the
inferior glenohumeral ligament for example on image [DATE]. These
findings can be associated with adhesive capsulitis.

Labrum: Subtle striations of the posterosuperior labrum on image
[DATE], with surface extension in the vicinity of the biceps anchor,
making it difficult to completely exclude the possibility of a
nondisplaced SLAP tear. There is some fluid signal in the anterior
superior labrum for example on images 11-12 of series 6 which is
otherwise nonspecific based on location.

Bones: Likely degenerative subcortical cyst formation anteriorly
along the greater tuberosity example on image [DATE].

Other: No supplemental non-categorized findings.
IMPRESSION: 1. Partially torn intra-articular segment of the long head of the
biceps.
2. Subtle striations of the posterosuperior labrum with surface
extension in the vicinity of the biceps anchor, making it difficult
to exclude a nondisplaced SLAP tear.
3. Thickened coracohumeral ligament with synovitis in the rotator
interval and mild thickening of the BLACK, all findings which can be
associated with adhesive capsulitis.
4. Moderate degenerative AC joint arthropathy.
5. Mild supraspinatus tendinopathy.
6. Mild fatty atrophy of the teres minor muscle may reflect remote
quadrilateral space syndrome.

## 2020-05-17 ENCOUNTER — Other Ambulatory Visit (HOSPITAL_COMMUNITY): Payer: Self-pay | Admitting: Orthopedic Surgery

## 2020-05-17 ENCOUNTER — Other Ambulatory Visit: Payer: Self-pay | Admitting: Orthopedic Surgery

## 2020-05-17 DIAGNOSIS — M545 Low back pain, unspecified: Secondary | ICD-10-CM

## 2020-06-29 ENCOUNTER — Ambulatory Visit (HOSPITAL_COMMUNITY)
Admission: RE | Admit: 2020-06-29 | Discharge: 2020-06-29 | Disposition: A | Discharge status: Home / Self Care | Payer: Medicare Other | Source: Ambulatory Visit | Attending: Orthopedic Surgery | Admitting: Orthopedic Surgery

## 2020-06-29 ENCOUNTER — Other Ambulatory Visit: Payer: Self-pay

## 2020-06-29 DIAGNOSIS — M545 Low back pain, unspecified: Secondary | ICD-10-CM

## 2020-06-29 IMAGING — MR MR LUMBAR SPINE W/O CM
5 of 6 series · 25 of 48 positions shown · non-contrast
Comparison: [DATE]

CLINICAL DATA: Pain radiating into the bilateral legs and feet with
numbness and weakness over the ears

EXAM:
MRI LUMBAR SPINE WITHOUT CONTRAST
TECHNIQUE: Multiplanar, multisequence MR imaging of the lumbar spine was
performed. No intravenous contrast was administered.

[Series 5: T1 · sagittal · 4.0mm · 0.81mm/px · 3 of 17 slices shown (1 of 2)]
[im 1/17]
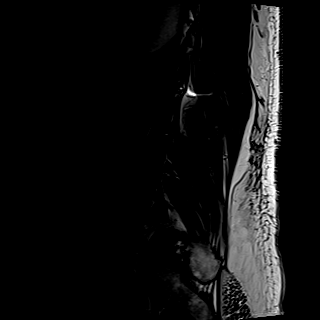
[im 9/17]
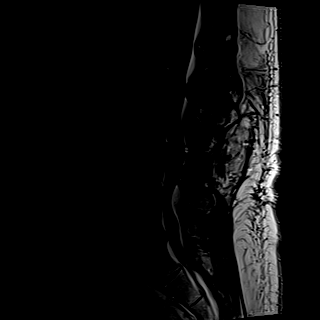
[im 17/17]
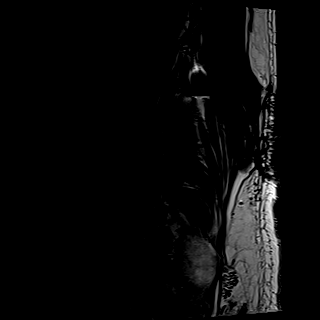

[Series 6: T2 · sagittal · 4.0mm · 0.81mm/px · 3 of 17 slices shown (1 of 2)]
[im 1/17]
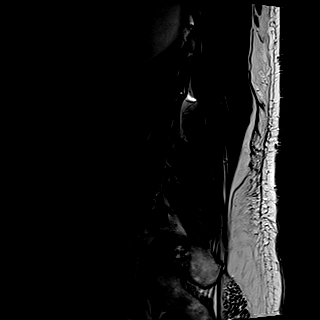
[im 9/17]
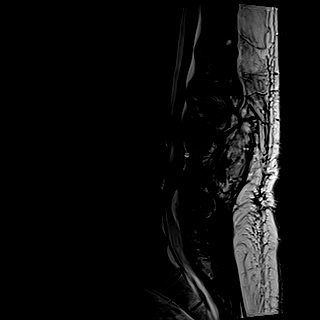
[im 17/17]
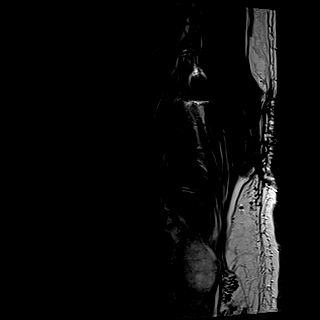

[Series 7: STIR · sagittal · 4.0mm · 0.51mm/px · 3 of 17 slices shown]
[im 1/17]
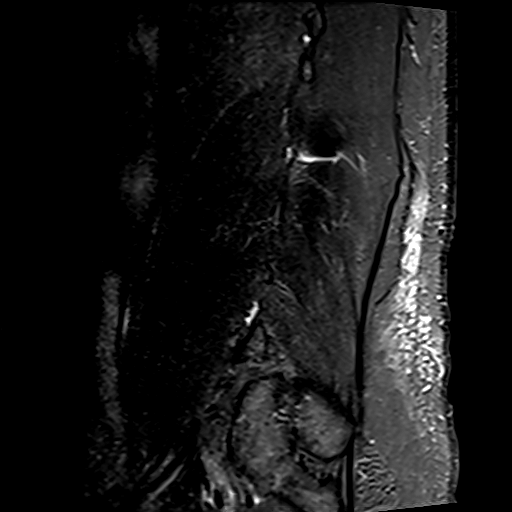
[im 9/17]
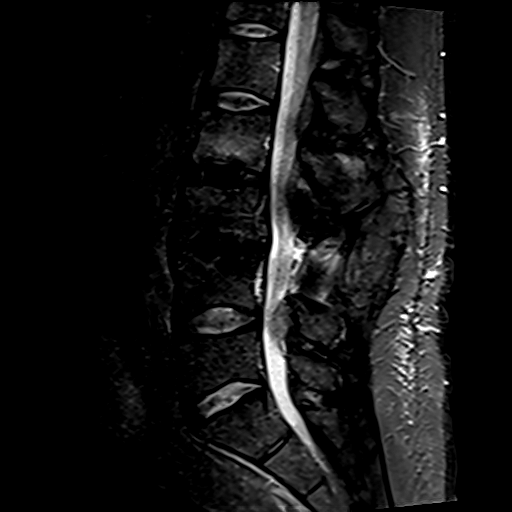
[im 17/17]
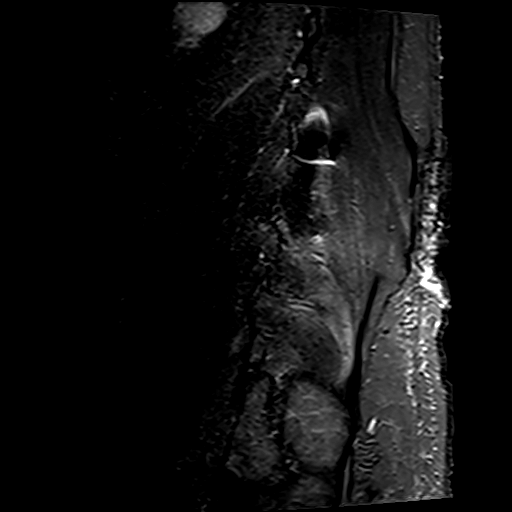

[Series 8: T2 · axial · 4.0mm · 0.62mm/px · z∈[-43,+200]mm · 8 of 45 slices shown (2 of 2)]
[im 1/45]
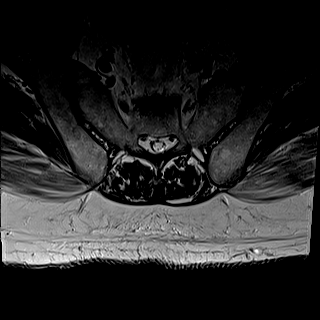
[im 7/45]
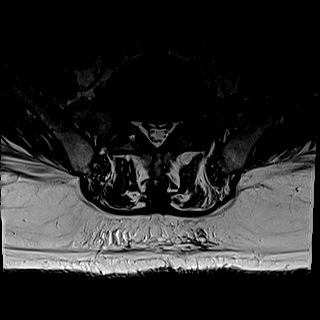
[im 13/45]
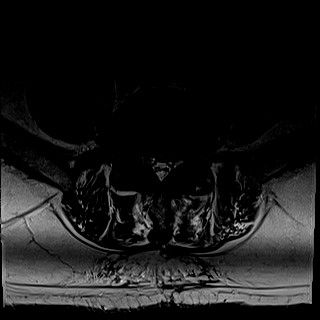
[im 19/45]
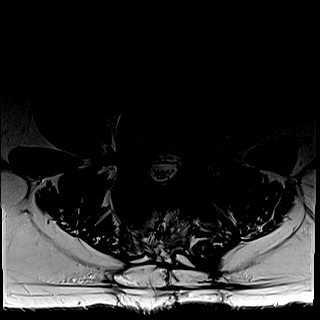
[im 26/45]
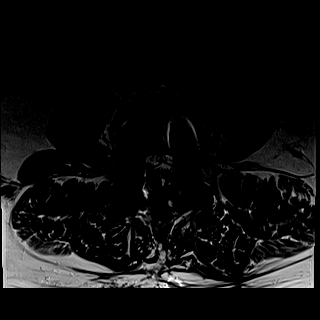
[im 32/45]
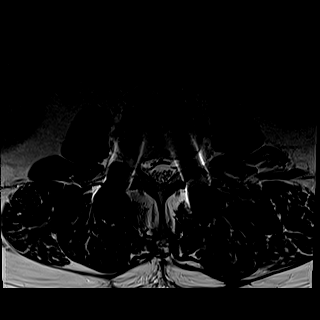
[im 38/45]
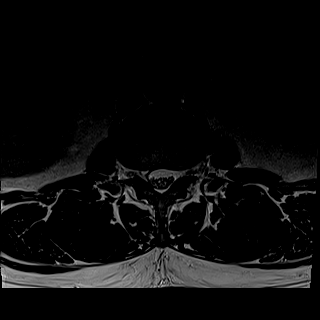
[im 45/45]
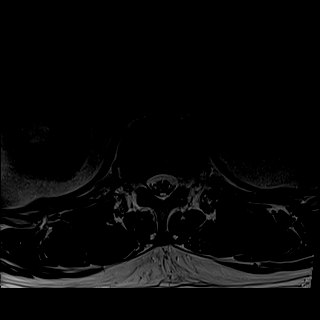

[Series 10: T1 · axial · 4.0mm · 0.39mm/px · z∈[-75,+159]mm · 8 of 45 slices shown (2 of 2)]
[im 1/45]
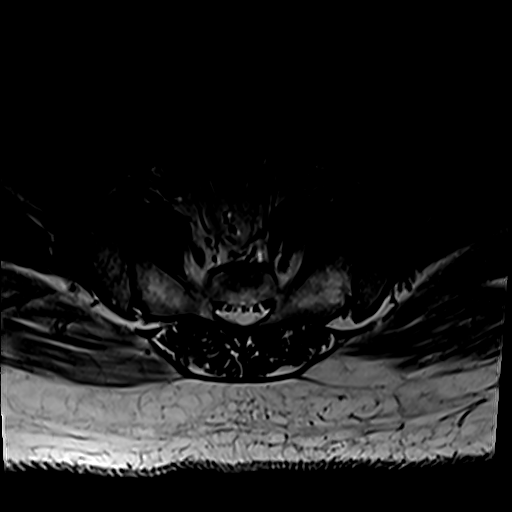
[im 7/45]
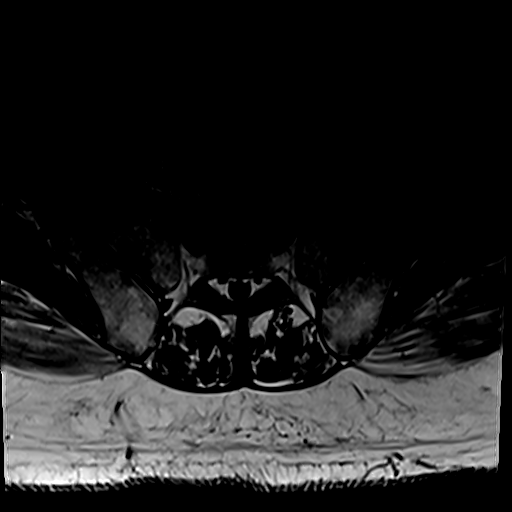
[im 13/45]
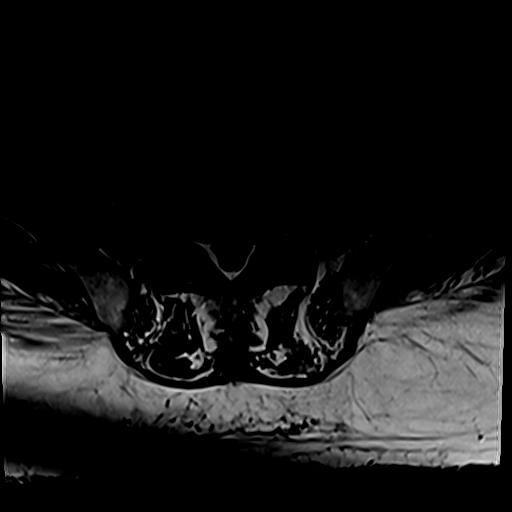
[im 19/45]
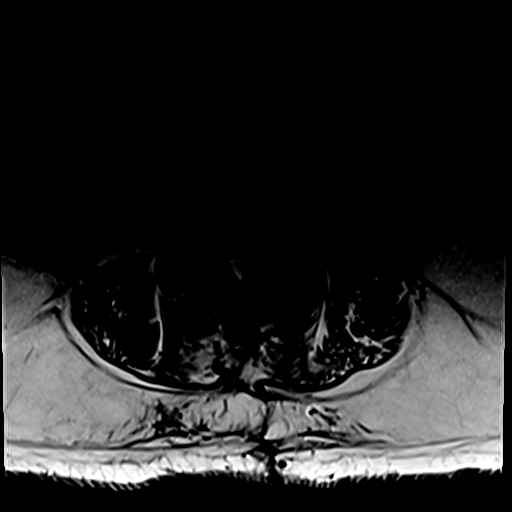
[im 26/45]
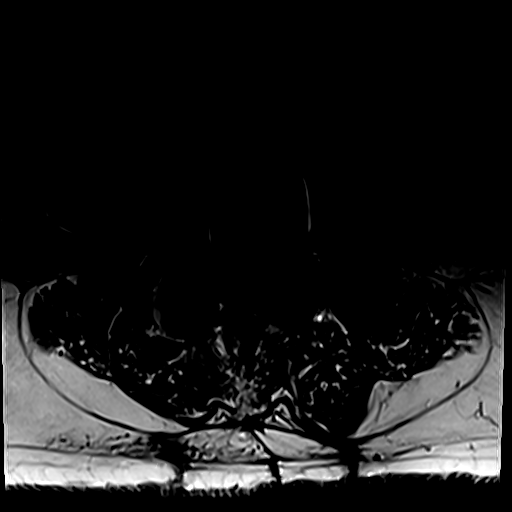
[im 32/45]
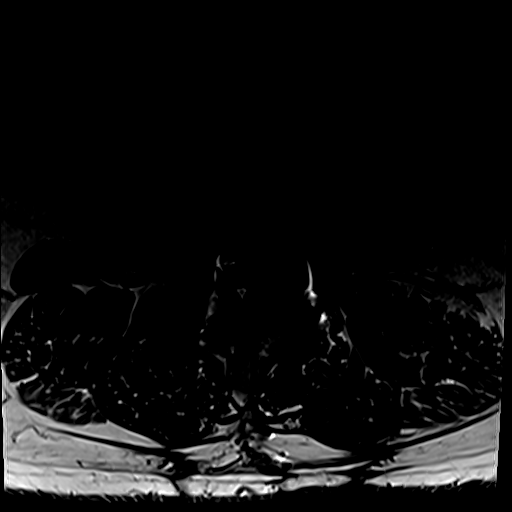
[im 38/45]
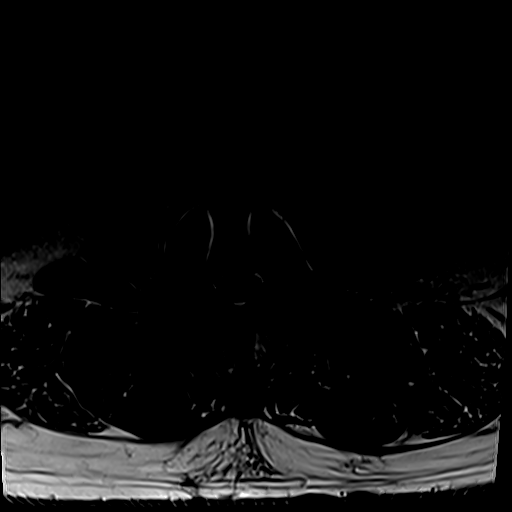
[im 45/45]
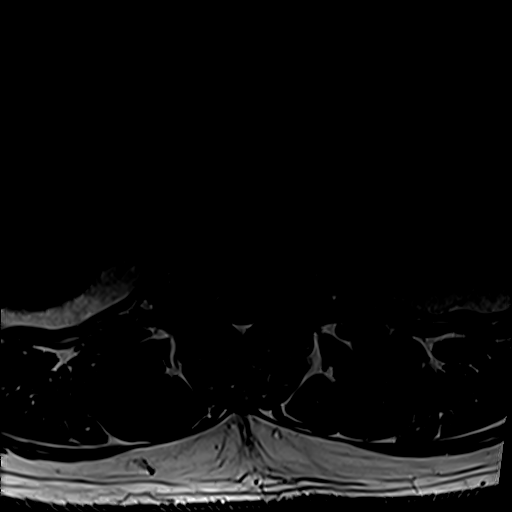

[25 of 48 positions shown; findings below may reference images not displayed]

FINDINGS: Segmentation:  5 lumbar type vertebrae

Alignment:  Straightening of the lumbar spine.

Vertebrae:  No fracture, evidence of discitis, or bone lesion.

Conus medullaris and cauda equina: Conus extends to the L1 level.
Conus and cauda equina appear normal.

Paraspinal and other soft tissues: Cystic appearance at the left
renal hilum is best attributed to sinus cysts, similar to prior.
Cortical cysts are also partially covered the bilateral kidneys.

Disc levels:

T12- L1: Unremarkable.

L1-L2: Unremarkable.

L2-L3: Interval PLIF.  No neural compression.

L3-L4: Interval PLIF. Artifact partially obscures both foramina. On
the left there appears to PRATO3 root flattening from residual disc
and facet spurring, see series 6, image 14. Ridging also effaces the
inferior right foramen at this level. The canal is patent after
laminectomy

L4-L5: Moderate thecal sac narrowing due to dorsal epidural fat
expansion, progressed. Degenerative facet spurring with left-sided
effusion. Bilateral foraminal narrowing is mild

L5-S1:Mild disc bulging.  No neural compression.
IMPRESSION: 1. L4-5 moderate thecal sac narrowing primarily due to dorsal
epidural lipomatosis, mildly progressed from [BS]. both subarticular
recesses are effaced at this level and the L5 nerve roots could be
affected.
2. L2-3 interval PLIF without evidence of impingement.
3. L3-4 more remote PLIF with potential for residual foraminal
impingement at this level.

## 2020-08-06 ENCOUNTER — Encounter: Payer: Self-pay | Admitting: Gastroenterology

## 2020-08-16 ENCOUNTER — Other Ambulatory Visit: Payer: Self-pay

## 2020-08-16 ENCOUNTER — Ambulatory Visit (AMBULATORY_SURGERY_CENTER): Payer: Self-pay | Admitting: *Deleted

## 2020-08-16 VITALS — Ht 71.0 in | Wt 290.0 lb

## 2020-08-16 DIAGNOSIS — Z1211 Encounter for screening for malignant neoplasm of colon: Secondary | ICD-10-CM

## 2020-08-16 NOTE — Progress Notes (Signed)

## 2020-08-30 ENCOUNTER — Telehealth: Payer: Self-pay | Admitting: Gastroenterology

## 2020-08-30 ENCOUNTER — Encounter: Payer: Medicare Other | Admitting: Gastroenterology

## 2020-08-30 ENCOUNTER — Encounter: Payer: Self-pay | Admitting: Gastroenterology

## 2020-08-30 NOTE — Telephone Encounter (Signed)
Hi Dr. Christella Hartigan, this patient will not be coming for his procedure today at 3:30pm.  He was under the impression that his procedure was tomorrow and that somebody would contact him yesterday for instructions on how to prepare for procedure.  He had a virtual pre-visit on 08/16/20, however he did not remember it at all.  He is rescheduled for colonoscopy on 09/29/20 and in person pre-visit on 09/15/20.

## 2020-09-13 ENCOUNTER — Telehealth: Payer: Self-pay | Admitting: *Deleted

## 2020-09-13 NOTE — Telephone Encounter (Signed)
Pt called asking for a Virtual PV   I called pt back and informed him that due to him not following directions with his last colon after a virtual PV, he needs to come in the PV and in person see a nurse  PT REFUSED an in person PV on [facility]- he states he limits himself and that he doesn't have his wheel chair- I asked pt if he can transfer himself from wheel chair to bed and back and he states I can do that but when asked if he can stand he said " NOT WELL"  After multiple attempts informing pt that he needs an IN PERSON PV, pt refused and said he will only do it over the phone   I did explain to him that we will call [facility] for a nurse  Visit  but we will NOT call back on Tuesday and Wednesday for instruct him on  procedure instructions, that will be done wed 12-15- he verbalized understanding

## 2020-09-13 NOTE — Telephone Encounter (Signed)
Please cancel what ever virtual previsit we have sent and also please cancel his upcoming procedure in the [place].  I have never met this man and I am increasing unsure if the [facility] outpatient facility is a correct venue for him.    Please offer him my first available new patient office appointment.  I will discuss colon cancer screening options with him at that point.  Thank you

## 2020-09-13 NOTE — Telephone Encounter (Signed)
Dr Christella Hartigan,  JUST  FYI    Pt called asking for a Virtual PV   I called pt back and informed him that due to him not following directions with his last colon after a virtual PV, he needs to come in the PV and in person see a nurse  PT REFUSED an in person PV on [facility]- he states he limits himself and that he doesn't have his wheel chair- I asked pt if he can transfer himself from wheel chair to bed and back and he states I can do that but when asked if he can stand he said " NOT WELL"  After multiple attempts informing pt that he needs an IN PERSON PV, pt refused and said he will only do it over the phone   I did explain to him that we will call [facility] for a nurse  Visit  but we will NOT call back on Tuesday and Wednesday for instruct him on  procedure instructions, that will be done wed 12-15- he verbalized understanding

## 2020-09-13 NOTE — Telephone Encounter (Signed)
Attempted pt to schedule OV with Dr Christella Hartigan- No answer, mail box full - MARIE

## 2020-09-13 NOTE — Telephone Encounter (Signed)
Attempted pt- no answer, mail box full- unable to LM- [facility] PV

## 2020-09-14 NOTE — Telephone Encounter (Signed)
Attempted to reach pt-no answer.  Mail box full-unable to leave message.

## 2020-09-14 NOTE — Telephone Encounter (Signed)
Attempted to reach pt again with no answer and mailbox full so no messages can be left.

## 2020-09-16 ENCOUNTER — Other Ambulatory Visit (HOSPITAL_COMMUNITY): Payer: Self-pay | Admitting: Orthopedic Surgery

## 2020-09-16 DIAGNOSIS — M545 Low back pain, unspecified: Secondary | ICD-10-CM

## 2020-09-16 NOTE — Telephone Encounter (Signed)
Pt did NS his PV yesterday -- nurses tried several times to contact him- not able to reach him  I canceled his PV  And Colon   Letter mailed to make an OV with Dr Christella Hartigan

## 2020-09-29 ENCOUNTER — Encounter: Payer: Medicare Other | Admitting: Gastroenterology

## 2021-03-02 DIAGNOSIS — L308 Other specified dermatitis: Secondary | ICD-10-CM | POA: Diagnosis not present

## 2021-03-02 DIAGNOSIS — I872 Venous insufficiency (chronic) (peripheral): Secondary | ICD-10-CM | POA: Diagnosis not present

## 2021-06-29 DIAGNOSIS — M7502 Adhesive capsulitis of left shoulder: Secondary | ICD-10-CM | POA: Diagnosis not present

## 2021-07-12 DIAGNOSIS — I872 Venous insufficiency (chronic) (peripheral): Secondary | ICD-10-CM | POA: Diagnosis not present

## 2021-11-11 ENCOUNTER — Other Ambulatory Visit: Payer: Self-pay | Admitting: Orthopedic Surgery

## 2021-11-11 DIAGNOSIS — M545 Low back pain, unspecified: Secondary | ICD-10-CM | POA: Diagnosis not present

## 2021-11-11 DIAGNOSIS — M5416 Radiculopathy, lumbar region: Secondary | ICD-10-CM | POA: Diagnosis not present

## 2021-12-19 ENCOUNTER — Other Ambulatory Visit: Payer: Medicare Other

## 2022-02-01 ENCOUNTER — Ambulatory Visit
Admission: RE | Admit: 2022-02-01 | Discharge: 2022-02-01 | Disposition: A | Discharge status: Home / Self Care | Payer: Medicare PPO | Source: Ambulatory Visit | Attending: Orthopedic Surgery | Admitting: Orthopedic Surgery

## 2022-02-01 DIAGNOSIS — M545 Low back pain, unspecified: Secondary | ICD-10-CM

## 2022-02-01 IMAGING — MR MR LUMBAR SPINE W/O CM
4 of 5 series · 26 of 48 positions shown · non-contrast
Comparison: [DATE]

CLINICAL DATA: Pain in the legs with inability to walk

EXAM:
MRI LUMBAR SPINE WITHOUT CONTRAST
TECHNIQUE: Multiplanar, multisequence MR imaging of the lumbar spine was
performed. No intravenous contrast was administered.

[Series 2: T2 · sagittal · 4.0mm · 0.59mm/px · 6 of 15 slices shown (1 of 2)]
[im 1/15]
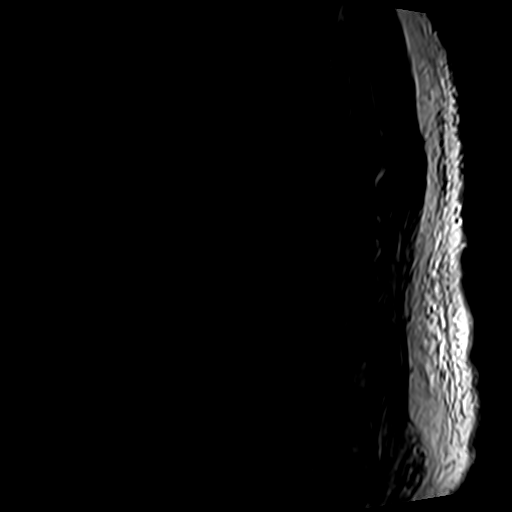
[im 3/15]
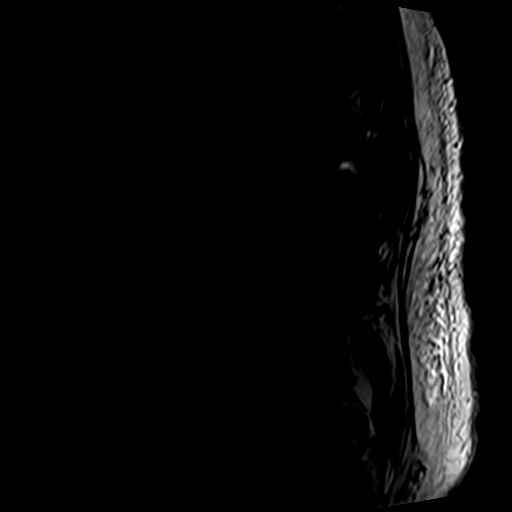
[im 6/15]
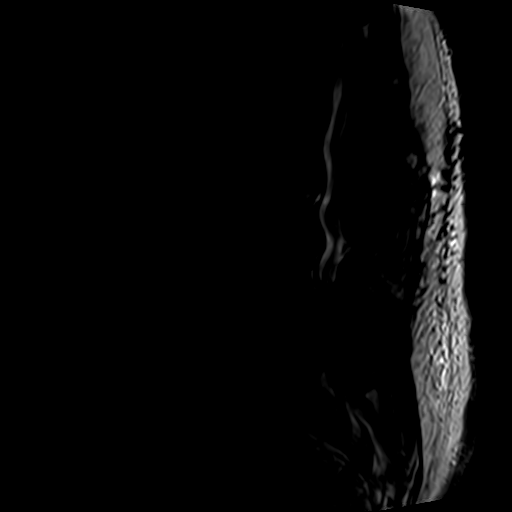
[im 9/15]
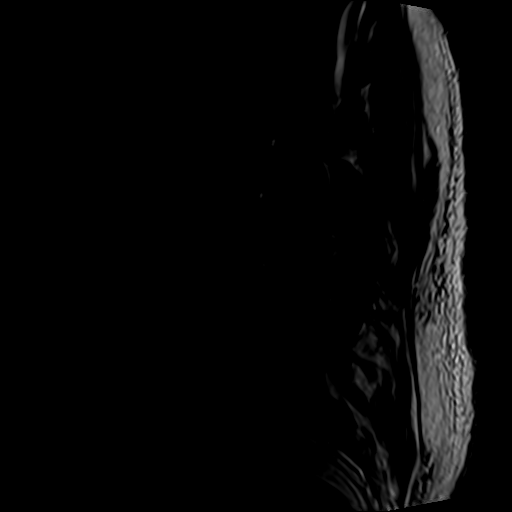
[im 12/15]
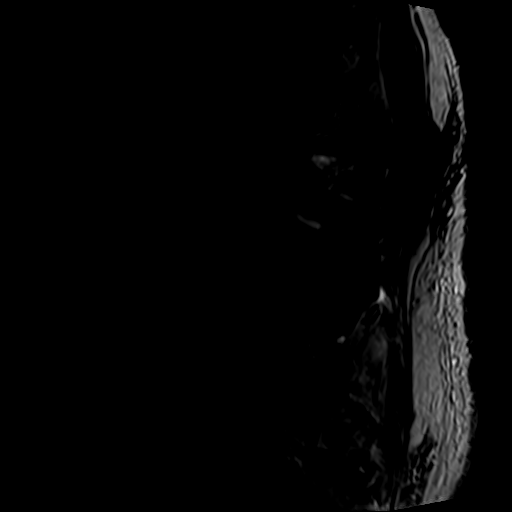
[im 15/15]
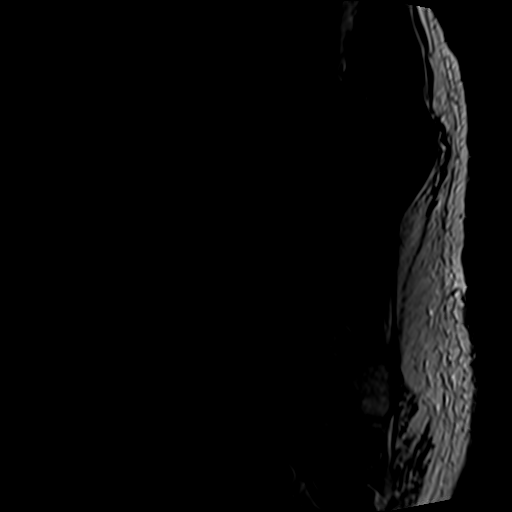

[Series 4: T1 · sagittal · 4.0mm · 0.53mm/px · 6 of 15 slices shown (1 of 2)]
[im 1/15]
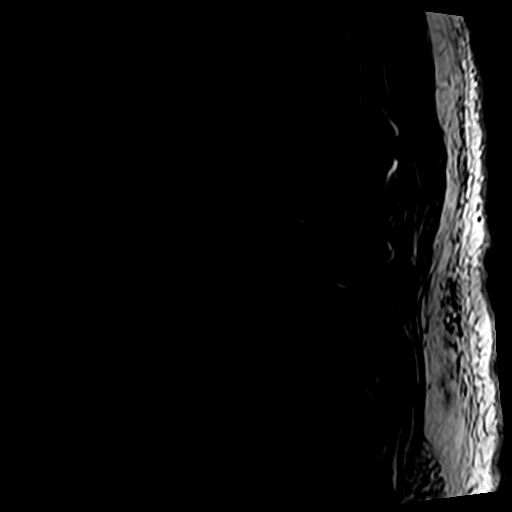
[im 3/15]
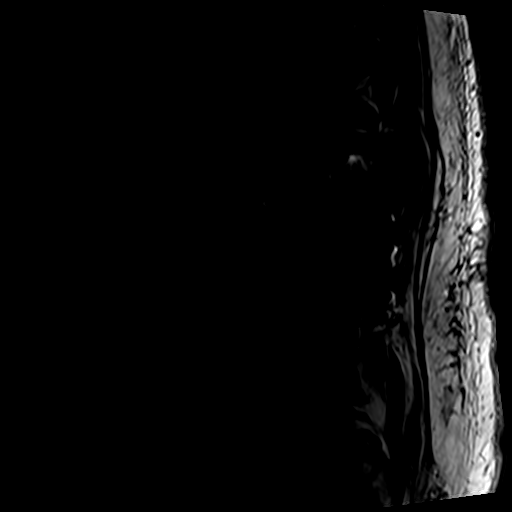
[im 6/15]
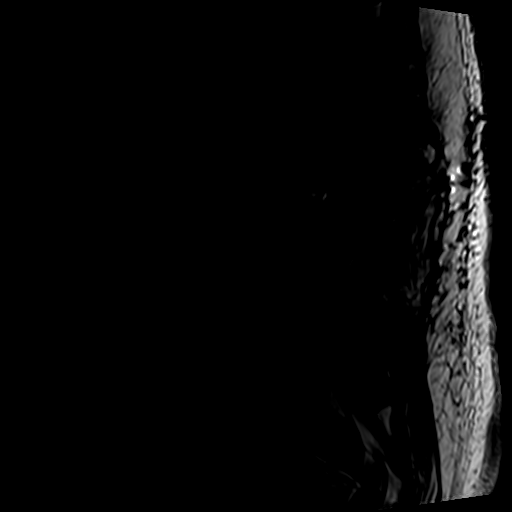
[im 9/15]
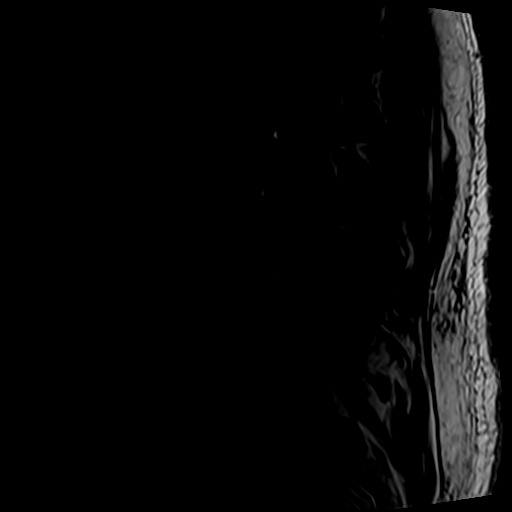
[im 12/15]
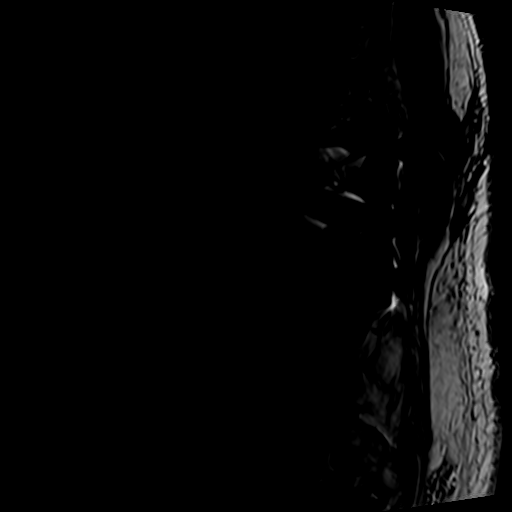
[im 15/15]
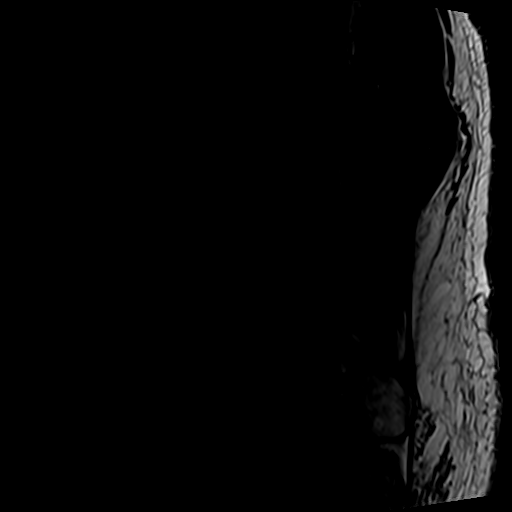

[Series 5: T2 · axial · 4.0mm · 0.70mm/px · z∈[-21,+197]mm · 9 of 40 slices shown (2 of 2)]
[im 1/40]
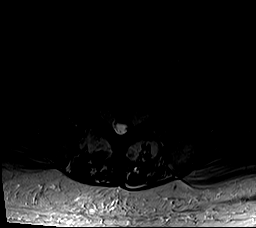
[im 6/40]
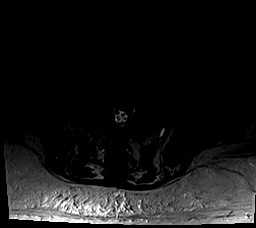
[im 12/40]
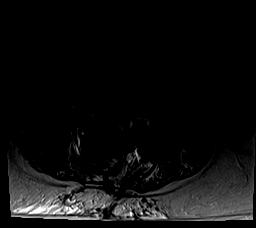
[im 17/40]
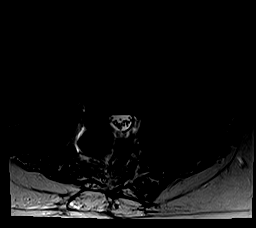
[im 20/40]
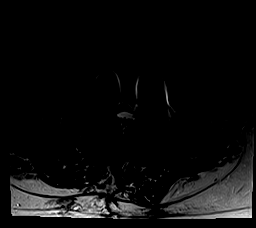
[im 23/40]
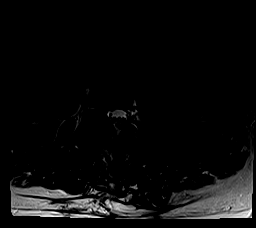
[im 28/40]
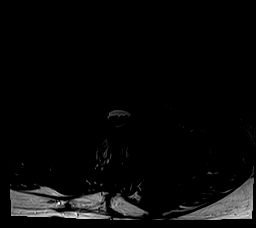
[im 34/40]
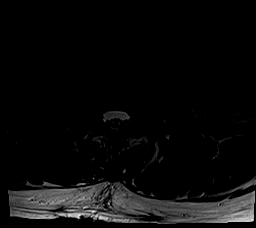
[im 40/40]
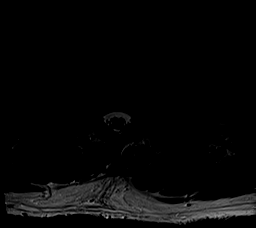

[Series 6: T1 · axial · 4.0mm · 0.35mm/px · z∈[-21,+166]mm · 5 of 40 slices shown (2 of 2)]
[im 1/40]
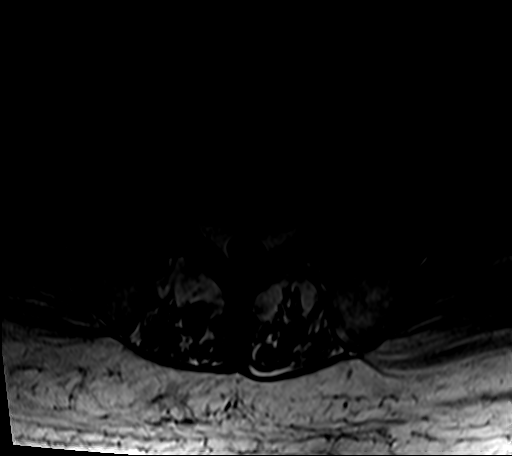
[im 6/40]
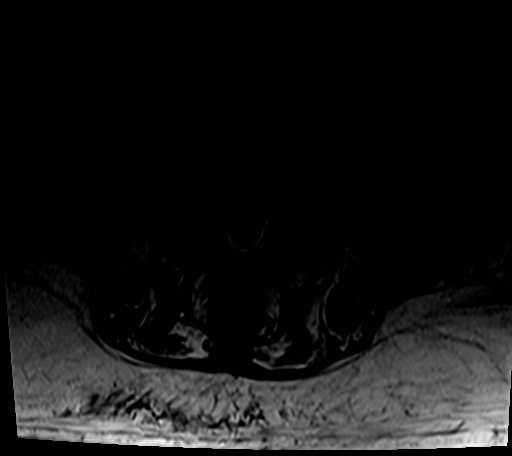
[im 12/40]
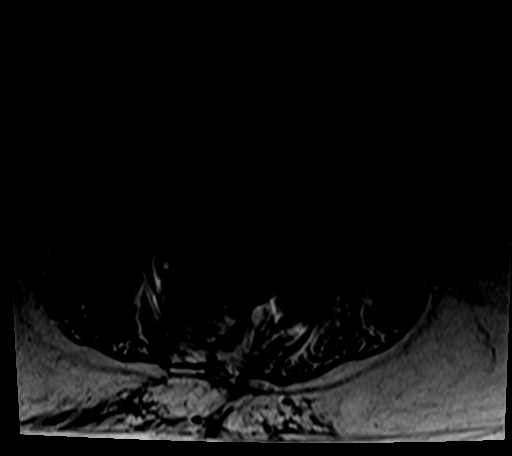
[im 20/40]
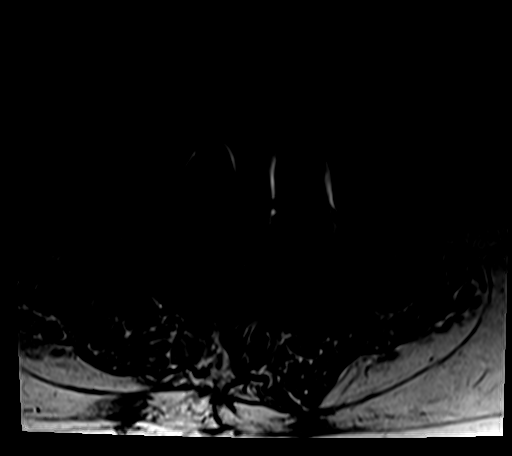
[im 34/40]
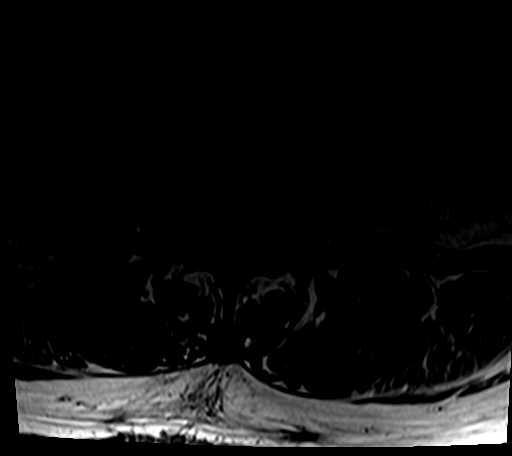

[26 of 48 positions shown; findings below may reference images not displayed]

FINDINGS: Segmentation:  5 lumbar type vertebrae

Alignment:  Straightening of lumbar lordosis

Vertebrae:  No fracture, evidence of discitis, or bone lesion.

Conus medullaris and cauda equina: Conus extends to the T12-L1
level. Conus and cauda equina appear normal.

Paraspinal and other soft tissues: Bilateral renal cysts, more
numerous on the left where there is also hilar involvement

Disc levels:

T12- L1: Unremarkable.

L1-L2: Mild disc space narrowing and bulging

L2-L3: PLIF.  No neural impingement

L3-L4: PLIF with metallic artifact limiting visualization of the
foramina where there is some encroachment by residual endplate and
facet spurring. Patent canal after laminectomy

L4-L5: Disc bulging and facet spurring with ligamentous thickening.
New synovial cyst medial to the left facet measuring up to 8 mm.
Combined with dorsal epidural fat expansion there is advanced thecal
sac compression. Moderate bilateral foraminal narrowing

L5-S1:Mild disc bulging and facet spurring. Posterior synovial cyst
on the left. Patent foramina.
IMPRESSION: 1. Severe and progressed thecal sac stenosis at L4-5 due to
degeneration and dorsal epidural lipomatosis. An 8 mm synovial cyst
has developed from the left facet and contributes to the progressive
narrowing.
2. L2-3 and L3-4 PLIF. Notable residual spurring at the L3-4
foramina, but limited by metallic artifact.

## 2022-02-06 ENCOUNTER — Ambulatory Visit
Admission: RE | Admit: 2022-02-06 | Discharge: 2022-02-06 | Disposition: A | Discharge status: Home / Self Care | Payer: Medicare PPO | Source: Ambulatory Visit | Attending: Orthopedic Surgery | Admitting: Orthopedic Surgery

## 2022-02-06 DIAGNOSIS — M545 Low back pain, unspecified: Secondary | ICD-10-CM

## 2022-02-06 IMAGING — CT CT L SPINE W/O CM
1 of 7 series · 6 of 14 positions shown, 8 images · non-contrast
Comparison: Lumbar spine MRI [DATE]

CLINICAL DATA: Difficulty walking, weakness in bilateral lower
extremities, lower back pain worsening recently. History of back
surgery



[Series 3: l spine soft · axial · 0.31mm/px · z∈[+668,+858]mm · 6 of 133 slices shown, 8 images]
[im 19/133  soft-tissue]
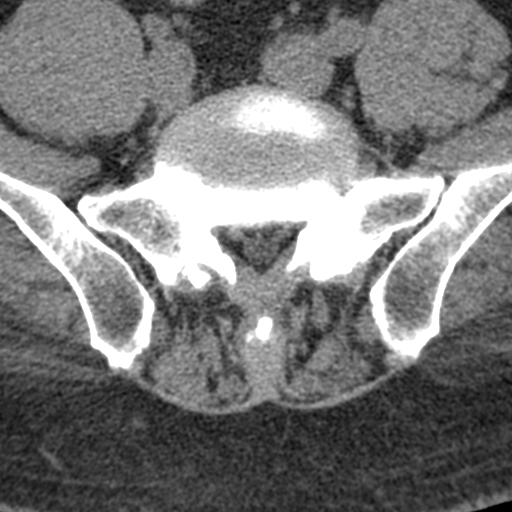
[im 19/133  bone]
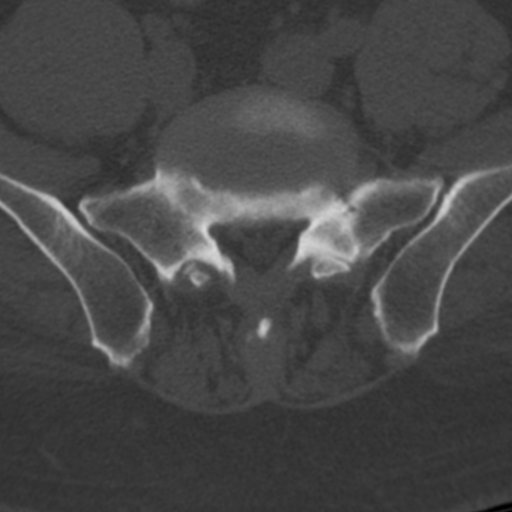
[im 38/133  bone]
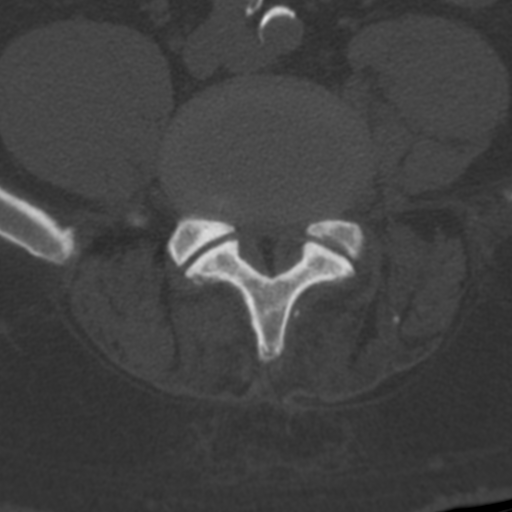
[im 57/133  bone]
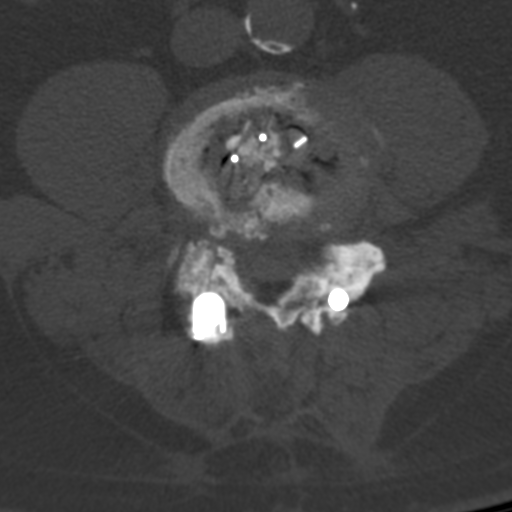
[im 76/133  bone]
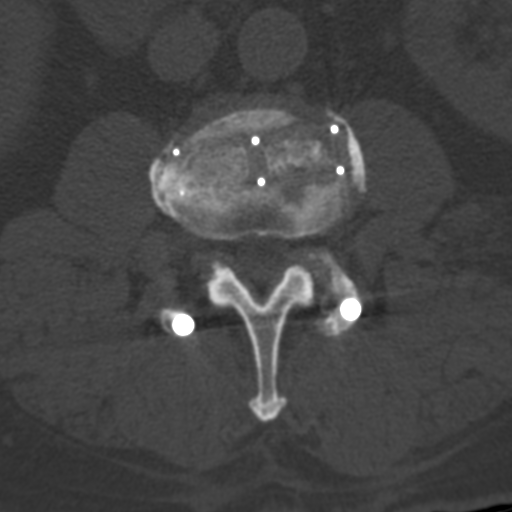
[im 95/133  soft-tissue]
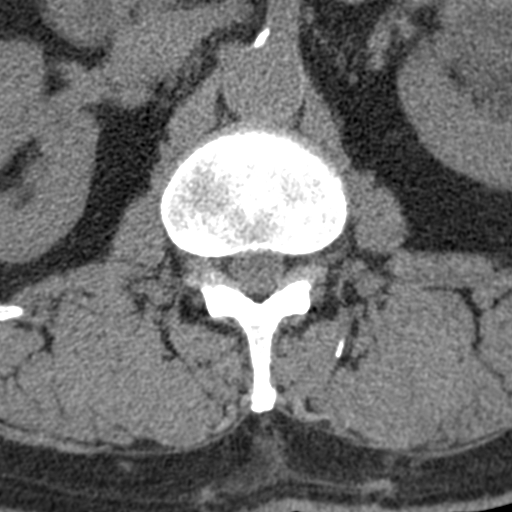
[im 95/133  bone]
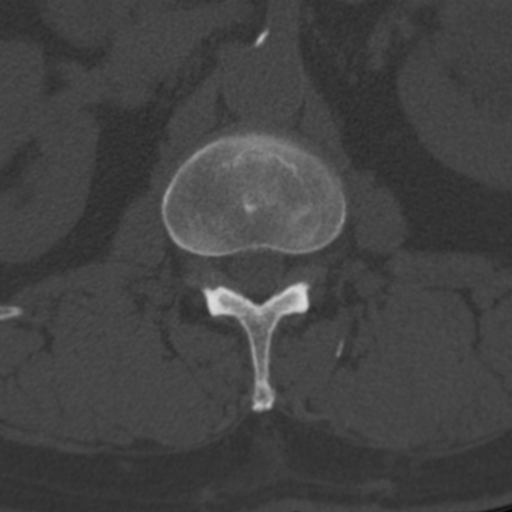
[im 114/133  bone]
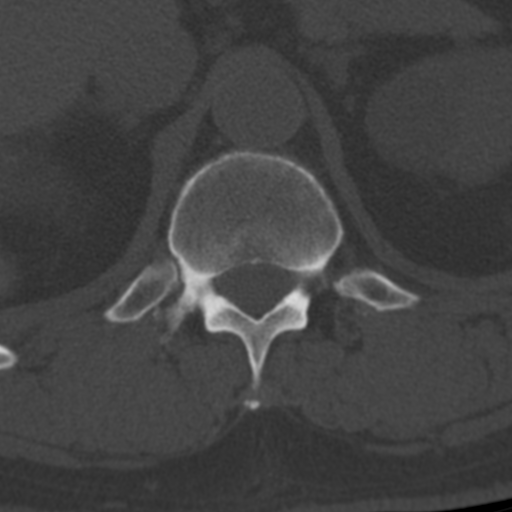

[6 of 14 positions shown; findings below may reference images not displayed]

FINDINGS: Segmentation: Standard; the lowest formed disc space is designated
L5-S1.

Alignment: There is straightening of the normal lumbar lordosis.
There is no antero or retrolisthesis.

Vertebrae: Vertebral body heights are preserved. The patient is
status post posterior instrumented fusion with interbody spacer
placement at L2 through L4 with posterior decompression at L3-L4.
There is no evidence of hardware fracture or perihardware lucency.
There is subsidence of the spacer at L3-L4 both superiorly and
inferiorly. There is no convincing osseous fusion across the disc
spaces, but there is fusion of the posterior elements on the left at
and partially on the right at both levels. There is no suspicious
osseous lesion.

Paraspinal and other soft tissues: Multiple renal cysts are noted,
for which no specific imaging follow-up is required. There is
calcified atherosclerotic plaque of the abdominal aorta.
Postsurgical changes are noted in the soft tissues posterior to the
surgical levels.

Disc levels: The nonsurgical disc heights are overall preserved.

T12-L1: No significant spinal canal or neural foraminal stenosis

L1-L2: There is a mild disc bulge without significant spinal canal
or neural foraminal stenosis.

L2-L3: Status post posterior instrumented fusion without significant
spinal canal or neural foraminal stenosis.

L3-L4: Status post posterior instrumented fusion and decompression.
There is prominent endplate spurring likely resulting in moderate
left worse than right neural foraminal stenosis without significant
spinal canal stenosis.

L4-L5: There is a disc bulge and bilateral facet arthropathy
resulting in severe spinal canal stenosis and moderate bilateral
neural foraminal stenosis common better assessed on the recent
lumbar spine MRI. The synovial cyst arising from the left facet
joint is not well seen on the current study.

L5-S1: There is a mild disc bulge and facet arthropathy without
significant spinal canal or neural foraminal stenosis.
IMPRESSION: 1. Postsurgical changes reflecting posterior instrumented fusion at
L2 through L4 and decompression at L3-L4. There is no convincing
osseous fusion across the disc spaces, but there is fusion of the
posterior elements on the left and partially on the right at both
levels. There is subsidence of the disc spacer at L3-L4 both
superiorly and inferiorly.
2. Endplate spurring at L3-L4 contributes to moderate bilateral
neural foraminal stenosis.
3. Severe spinal canal stenosis and moderate bilateral neural
foraminal stenosis at L4-L5, better assessed on the recent MRI.

## 2022-02-14 ENCOUNTER — Other Ambulatory Visit: Payer: Self-pay | Admitting: Orthopedic Surgery

## 2022-02-14 DIAGNOSIS — M5412 Radiculopathy, cervical region: Secondary | ICD-10-CM

## 2022-02-24 ENCOUNTER — Other Ambulatory Visit (HOSPITAL_COMMUNITY): Payer: Self-pay | Admitting: Orthopedic Surgery

## 2022-02-24 DIAGNOSIS — M542 Cervicalgia: Secondary | ICD-10-CM

## 2022-03-05 ENCOUNTER — Ambulatory Visit (HOSPITAL_COMMUNITY)
Admission: RE | Admit: 2022-03-05 | Discharge: 2022-03-05 | Disposition: A | Discharge status: Home / Self Care | Payer: Medicare PPO | Source: Ambulatory Visit | Attending: Orthopedic Surgery | Admitting: Orthopedic Surgery

## 2022-03-05 DIAGNOSIS — M542 Cervicalgia: Secondary | ICD-10-CM | POA: Diagnosis present

## 2022-03-05 IMAGING — MR MR CERVICAL SPINE W/O CM
4 of 6 series · 18 of 48 positions shown · non-contrast
Comparison: None Available.

CLINICAL DATA: Neck pain

EXAM:
MRI CERVICAL SPINE WITHOUT CONTRAST
TECHNIQUE: Multiplanar, multisequence MR imaging of the cervical spine was
performed. No intravenous contrast was administered.

[Series 2: T2 · sagittal · 3.0mm · 0.35mm/px · 3 of 16 slices shown (1 of 2)]
[im 1/16]
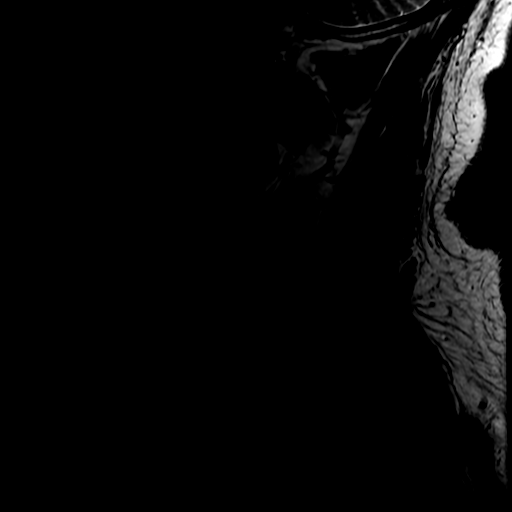
[im 8/16]
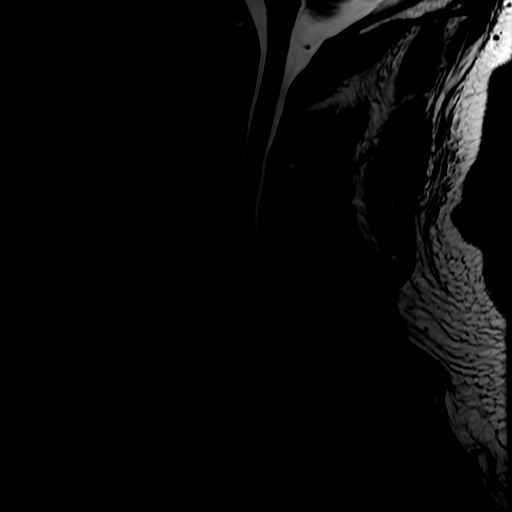
[im 16/16]
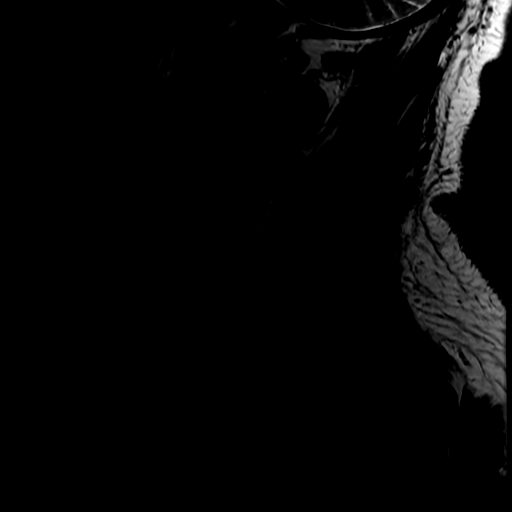

[Series 4: STIR · sagittal · 3.0mm · 0.35mm/px · 3 of 15 slices shown]
[im 1/15]
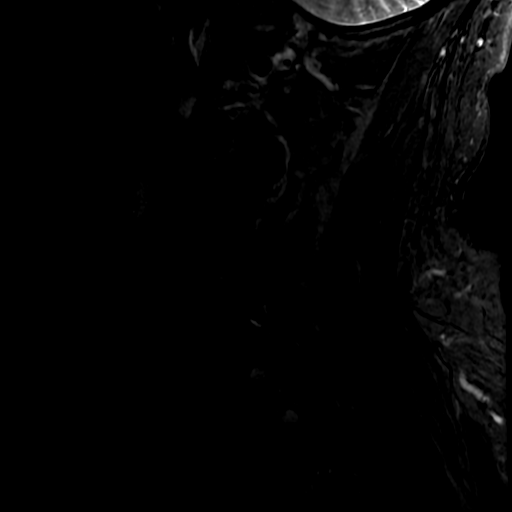
[im 8/15]
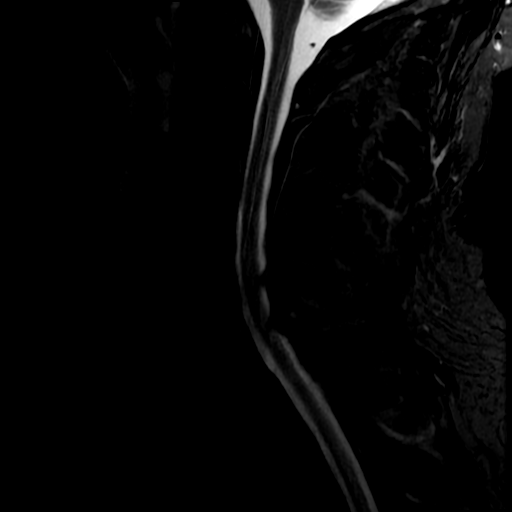
[im 15/15]
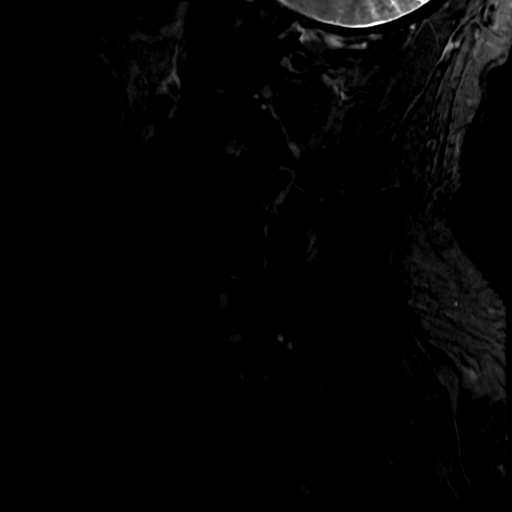

[Series 6: T2 · axial · 3.0mm · 0.35mm/px · z∈[-36,+96]mm · 8 of 40 slices shown (2 of 2)]
[im 1/40]
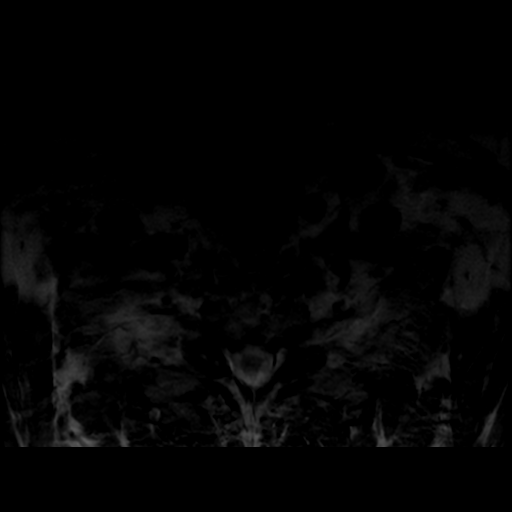
[im 6/40]
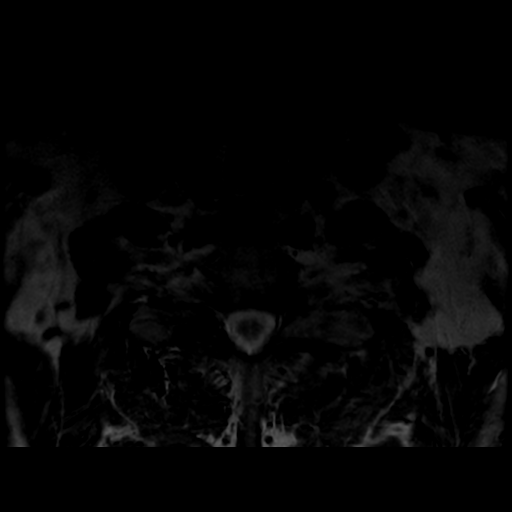
[im 12/40]
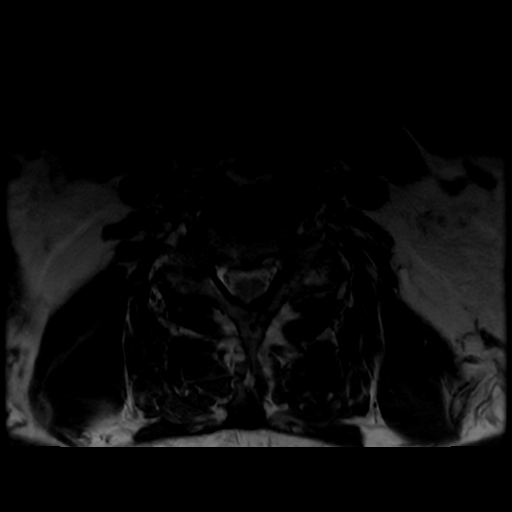
[im 17/40]
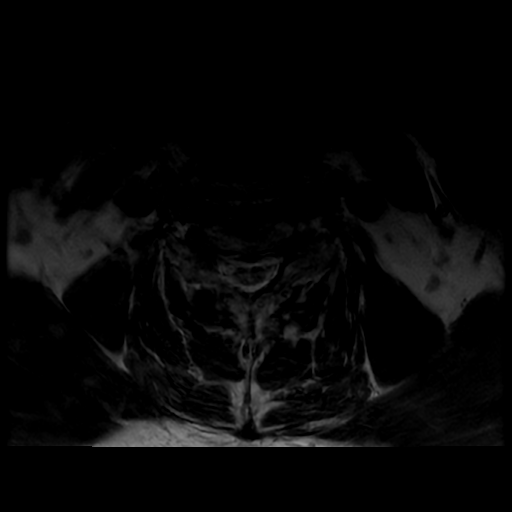
[im 23/40]
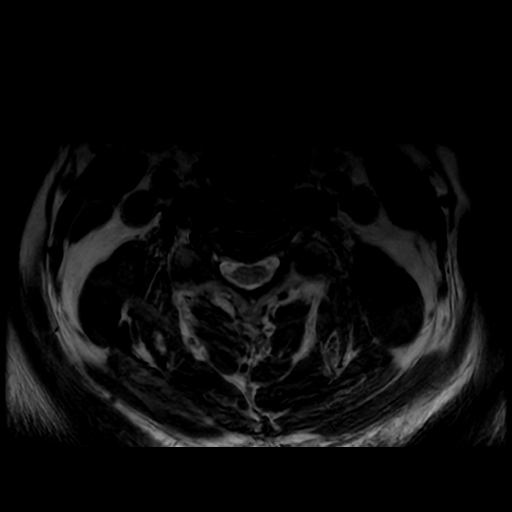
[im 28/40]
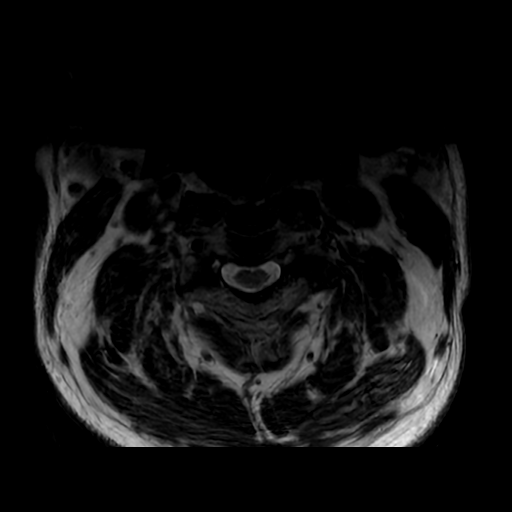
[im 34/40]
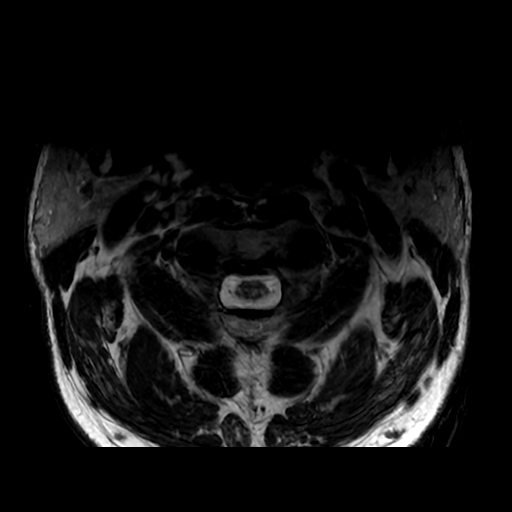
[im 40/40]
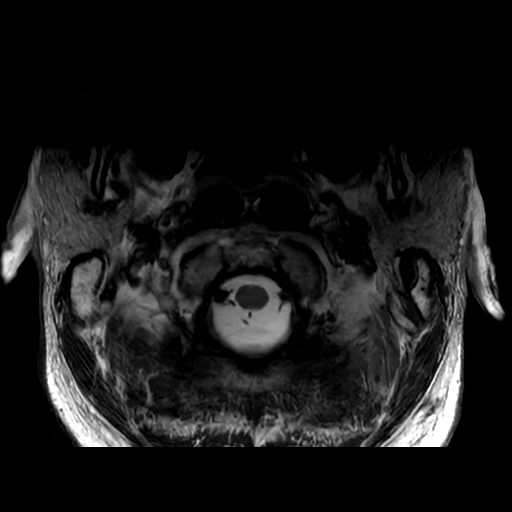

[Series 7: T1 · axial · non-contrast · 3.0mm · 0.35mm/px · z∈[-36,+73]mm · 4 of 39 slices shown]
[im 1/39]
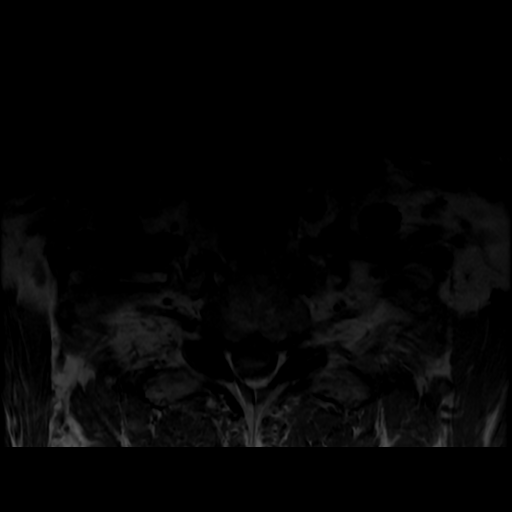
[im 6/39]
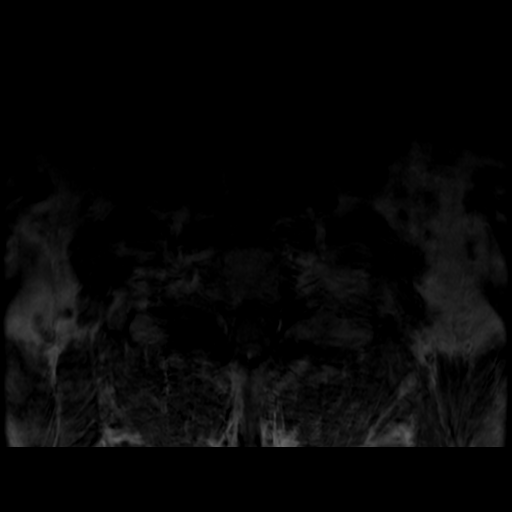
[im 22/39]
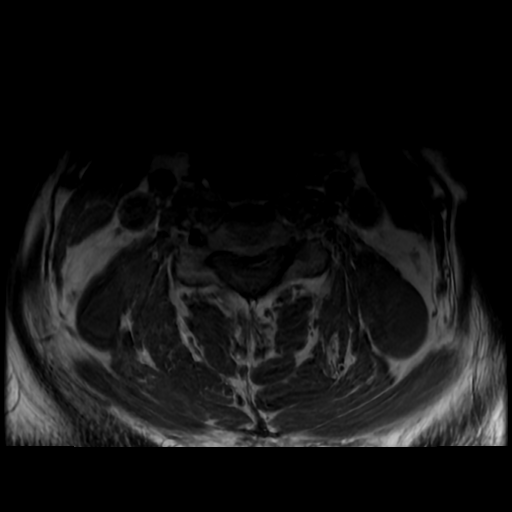
[im 33/39]
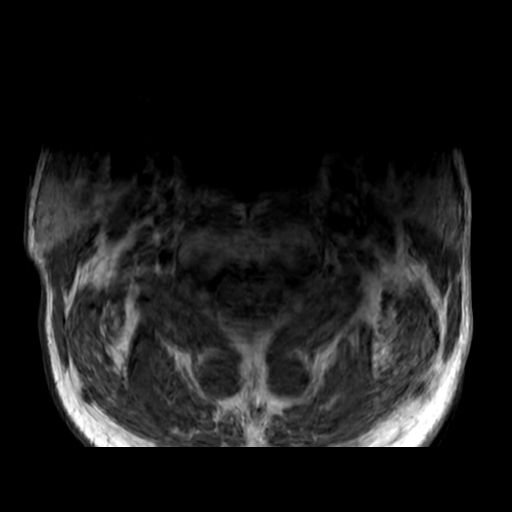

[18 of 48 positions shown; findings below may reference images not displayed]

FINDINGS: Technical Note: Despite efforts by the technologist and patient,
motion artifact is present on today's exam and could not be
eliminated. This reduces exam sensitivity and specificity.

Alignment: 3 mm grade 1 anterolisthesis C6 on C7. Minimal
anterolisthesis at C7 on T1.

Vertebrae: No fracture, evidence of discitis, or bone lesion.

Cord: No evidence of focal cord signal abnormality on motion
degraded images.

Posterior Fossa, vertebral arteries, paraspinal tissues: Left
vertebral artery flow void is not definitively seen. No paraspinal
mass or other abnormality.

Disc levels:

C2-C3: No disc protrusion. Left-sided facet arthropathy with mild
left foraminal stenosis. No significant canal stenosis.

C3-C4: No disc protrusion. Mild bilateral facet arthropathy and mild
right uncovertebral spurring. No significant foraminal stenosis or
canal stenosis.

C4-C5: Shallow central disc protrusion. Mild bilateral facet
arthropathy. Borderline-mild canal stenosis with suspected mild
bilateral foraminal stenosis.

C5-C6: Disc osteophyte complex with shallow central disc protrusion.
Bilateral facet arthropathy and ligamentum flavum buckling. Findings
result in mild canal stenosis with at least moderate bilateral
foraminal stenosis.

C6-C7: Anterolisthesis with disc uncovering and right paracentral
disc bulge. Facet and uncovertebral arthropathy. No significant
canal stenosis. Bilateral foraminal stenosis, likely mild to
moderate.

C7-T1: Anterolisthesis with disc uncovering. Right greater than left
facet arthropathy. No canal stenosis. Right greater than left
foraminal stenosis, which appears moderate.
IMPRESSION: 1. Motion degraded images.
2. Multilevel cervical spondylosis, most pronounced at the C5-6
level where there is mild canal stenosis and at least moderate
bilateral foraminal stenosis.
3. Right greater than left foraminal stenosis at C7-T1.
4. Left vertebral artery flow void is not definitively seen. This
may be due to slow flow or occlusion.

## 2022-03-14 ENCOUNTER — Other Ambulatory Visit: Payer: Self-pay | Admitting: Orthopedic Surgery

## 2022-04-08 NOTE — Pre-Procedure Instructions (Signed)
Evan Morgan  04/08/2022     Surgical Instructions   Your procedure is scheduled on Thurs., April 13, 2022 from 7:30AM-12:30PM.  Report to [facility] Main Entrance "A" at 5:30 A.M., then check in with the Admitting office.  Call this number if you have problems the morning of surgery:  [phone]   Remember:  Do not eat after midnight on July 12th  You may drink clear liquids until 3 hours 4:30AM prior to surgery time, the morning of your surgery.   Clear liquids allowed are: Water, Non-Citrus Juices (without pulp), Carbonated Beverages, Clear Tea, Black Coffee ONLY (NO MILK, CREAM OR POWDERED CREAMER of any kind), and Gatorade  Enhanced Recovery after Surgery for Orthopedics Enhanced Recovery after Surgery is a protocol used to improve the stress on your body and your recovery after surgery.  Patient Instructions The day of surgery (if you do NOT have diabetes): Drink by 4:30AM  Drink ONE (1) Pre-Surgery Clear Ensure as directed.   This drink was given to you during your hospital  pre-op appointment visit. Finish the drink at the designated time by the pre-op nurse.  Nothing else to drink after completing the  Pre-Surgery Clear Ensure.         If you have questions, please contact your surgeon's office.    Take these medicines the morning of surgery with A SIP OF WATER: Atenolol (TENORMIN) Finasteride (PROSCAR) Gabapentin (NEURONTIN) HydrALAZINE (APRESOLINE)  Tamsulosin (FLOMAX)  If Needed: Acetaminophen (TYLENOL)  As of today, STOP taking any Aspirin (unless otherwise instructed by your surgeon) Aleve, Naproxen, Ibuprofen, Motrin, Advil, Goody's, BC's, all herbal medications, fish oil, and all vitamins.             Day of Surgery: Do not wear jewelry  Do not wear lotions, powders, perfumes/colognes, or deodorant. Do not shave 48 hours prior to surgery.  Men may shave face and neck. Do not bring valuables to the hospital.             [facility] is not responsible  for any belongings or valuables.  Do NOT Smoke (Tobacco/Vaping)  24 hours prior to your procedure  If you use a CPAP at night, you may bring your mask and machine for your overnight stay.   Contacts, glasses, hearing aids, dentures or partials may not be worn into surgery, please bring cases for these belongings   For patients admitted to the hospital, discharge time will be determined by your treatment team.   Patients discharged the day of surgery will not be allowed to drive home, and someone needs to stay with them for 24 hours.  Special instructions:    Oral Hygiene is also important to reduce your risk of infection.  Remember - BRUSH YOUR TEETH THE MORNING OF SURGERY WITH YOUR REGULAR TOOTHPASTE  [facility]- Preparing For Surgery  Before surgery, you can play an important role. Because skin is not sterile, your skin needs to be as free of germs as possible. You can reduce the number of germs on your skin by washing with CHG (chlorahexidine gluconate) Soap before surgery.  CHG is an antiseptic cleaner which kills germs and bonds with the skin to continue killing germs even after washing.    Please do not use if you have an allergy to CHG or antibacterial soaps. If your skin becomes reddened/irritated stop using the CHG.  Do not shave (including legs and underarms) for at least 48 hours prior to first CHG shower. It is  OK to shave your face.  Please follow these instructions carefully.    Shower the NIGHT BEFORE SURGERY and the MORNING OF SURGERY with CHG Soap.   If you chose to wash your hair, wash your hair first as usual with your normal shampoo. After you shampoo, rinse your hair and body thoroughly to remove the shampoo.  Then [employer] and genitals (private parts) with your normal soap and rinse thoroughly to remove soap.  After that Use CHG Soap as you would any other liquid soap. You can apply CHG directly to the skin and wash gently with a scrungie or a clean washcloth.    Apply the CHG Soap to your body ONLY FROM THE NECK DOWN.  Do not use on open wounds or open sores. Avoid contact with your eyes, ears, mouth and genitals (private parts). Wash Face and genitals (private parts)  with your normal soap.   Wash thoroughly, paying special attention to the area where your surgery will be performed.  Thoroughly rinse your body with warm water from the neck down.  DO NOT shower/wash with your normal soap after using and rinsing off the CHG Soap.  Pat yourself dry with a CLEAN TOWEL.  Wear CLEAN PAJAMAS to bed the night before surgery  Place CLEAN SHEETS on your bed the night before your surgery  DO NOT SLEEP WITH PETS.  Reminder: Take a shower with CHG soap. Wear Clean/Comfortable clothing the morning of surgery Do not apply any deodorants/lotions.   Remember to brush your teeth WITH YOUR REGULAR TOOTHPASTE.   If you have been in contact with anyone that has tested positive in the last 10 days please notify you surgeon.  Notify your provider:  if you develop a fever of 100.4 or greater, sneezing, cough, sore throat, shortness of breath or body aches.  NO VISITORS WILL BE ALLOWED IN PRE-OP WHERE PATIENTS ARE PREPPED FOR SURGERY.    SURGICAL WAITING ROOM VISITATION Patients having surgery or a procedure in a hospital may have two support people. Children under the age of 55 must have an adult with them who is not the patient. They may stay in the waiting area during the procedure and may switch out with other visitors. If the patient needs to stay at the hospital during part of their recovery, the visitor guidelines for inpatient rooms apply.  Please refer to the [facility] website for the visitor guidelines for Inpatients (after your surgery is over and you are in a regular room).   Please read over the following fact sheets that you were given.

## 2022-04-10 ENCOUNTER — Encounter (HOSPITAL_COMMUNITY)
Admission: RE | Admit: 2022-04-10 | Discharge: 2022-04-10 | Disposition: A | Discharge status: Home / Self Care | Payer: Medicare PPO | Source: Ambulatory Visit | Attending: Orthopedic Surgery | Admitting: Orthopedic Surgery

## 2022-04-10 ENCOUNTER — Other Ambulatory Visit: Payer: Self-pay

## 2022-04-10 ENCOUNTER — Encounter (HOSPITAL_COMMUNITY): Payer: Self-pay

## 2022-04-10 VITALS — BP 124/75 | HR 54 | Temp 97.7°F | Resp 17 | Ht 71.0 in | Wt 290.0 lb

## 2022-04-10 DIAGNOSIS — I10 Essential (primary) hypertension: Secondary | ICD-10-CM | POA: Insufficient documentation

## 2022-04-10 DIAGNOSIS — Z01818 Encounter for other preprocedural examination: Secondary | ICD-10-CM

## 2022-04-10 HISTORY — DX: Unspecified osteoarthritis, unspecified site: M19.90

## 2022-04-10 LAB — COMPREHENSIVE METABOLIC PANEL
ALT: 17 U/L (ref 0–44)
AST: 23 U/L (ref 15–41)
Albumin: 3.9 g/dL (ref 3.5–5.0)
Alkaline Phosphatase: 37 U/L — ABNORMAL LOW (ref 38–126)
Anion gap: 10 (ref 5–15)
BUN: 11 mg/dL (ref 8–23)
CO2: 25 mmol/L (ref 22–32)
Calcium: 9.5 mg/dL (ref 8.9–10.3)
Chloride: 101 mmol/L (ref 98–111)
Creatinine, Ser: 1 mg/dL (ref 0.61–1.24)
GFR, Estimated: >60 mL/min (ref >60)
Glucose, Bld: 97 mg/dL (ref 70–99)
Potassium: 3.8 mmol/L (ref 3.5–5.1)
Sodium: 136 mmol/L (ref 135–145)
Total Bilirubin: 1.2 mg/dL (ref 0.3–1.2)
Total Protein: 8.1 g/dL (ref 6.5–8.1)

## 2022-04-10 LAB — CBC
HCT: 44.4 % (ref 39.0–52.0)
Hemoglobin: 15.2 g/dL (ref 13.0–17.0)
MCH: 31.3 pg (ref 26.0–34.0)
MCHC: 34.2 g/dL (ref 30.0–36.0)
MCV: 91.4 fL (ref 80.0–100.0)
Platelets: 142 10*3/uL — ABNORMAL LOW (ref 150–400)
RBC: 4.86 MIL/uL (ref 4.22–5.81)
RDW: 12.6 % (ref 11.5–15.5)
WBC: 4.7 10*3/uL (ref 4.0–10.5)
nRBC: 0 % (ref 0.0–0.2)

## 2022-04-10 LAB — TYPE AND SCREEN
ABO/RH(D): A POS
Antibody Screen: NEGATIVE

## 2022-04-10 LAB — SURGICAL PCR SCREEN
MRSA, PCR: NEGATIVE
Staphylococcus aureus: POSITIVE — AB

## 2022-04-10 NOTE — Progress Notes (Signed)
PCP - Dr. Leonie Douglas Cardiologist - denies  PPM/ICD - denies   Chest x-ray - denies EKG - 04/10/22 Stress Test - denies ECHO - denies Cardiac Cath - denies  Sleep Study - denies   DM- denies  ASA/Blood Thinner Instructions: n/a   ERAS Protcol - yes PRE-SURGERY Ensure given at PAT  COVID TEST- n/a   Anesthesia review: no  Patient denies shortness of breath, fever, cough and chest pain at PAT appointment   All instructions explained to the patient, with a verbal understanding of the material. Patient agrees to go over the instructions while at home for a better understanding. The opportunity to ask questions was provided.

## 2022-04-12 NOTE — Anesthesia Preprocedure Evaluation (Addendum)
Anesthesia Evaluation  Patient identified by MRN, date of birth, ID band Patient awake    Reviewed: Allergy & Precautions, NPO status , Patient's Chart, lab work & pertinent test results  History of Anesthesia Complications Negative for: history of anesthetic complications  Airway Mallampati: II  TM Distance: >3 FB Neck ROM: Full    Dental  (+) Dental Advisory Given   Pulmonary neg pulmonary ROS,    breath sounds clear to auscultation       Cardiovascular hypertension, Pt. on medications (-) angina Rhythm:Regular Rate:Normal     Neuro/Psych Back pain    GI/Hepatic GERD  Medicated and Controlled,(+) Hepatitis -, C  Endo/Other  Morbid obesity  Renal/GU negative Renal ROS     Musculoskeletal  (+) Arthritis ,   Abdominal (+) + obese,   Peds  Hematology negative hematology ROS (+)   Anesthesia Other Findings   Reproductive/Obstetrics                            Anesthesia Physical Anesthesia Plan  ASA: 3  Anesthesia Plan: General   Post-op Pain Management: Tylenol PO (pre-op)*   Induction: Intravenous  PONV Risk Score and Plan: 2 and Ondansetron and Dexamethasone  Airway Management Planned: Oral ETT  Additional Equipment: None  Intra-op Plan:   Post-operative Plan: Extubation in OR  Informed Consent: I have reviewed the patients History and Physical, chart, labs and discussed the procedure including the risks, benefits and alternatives for the proposed anesthesia with the patient or authorized representative who has indicated his/her understanding and acceptance.       Plan Discussed with: CRNA and Surgeon  Anesthesia Plan Comments:        Anesthesia Quick Evaluation

## 2022-04-13 ENCOUNTER — Inpatient Hospital Stay (HOSPITAL_COMMUNITY)
Admission: RE | Disposition: A | Discharge status: Skilled Nursing Facility | Payer: Self-pay | Source: Ambulatory Visit | Attending: Orthopedic Surgery

## 2022-04-13 ENCOUNTER — Inpatient Hospital Stay (HOSPITAL_COMMUNITY): Payer: Medicare PPO | Admitting: Vascular Surgery

## 2022-04-13 ENCOUNTER — Inpatient Hospital Stay (HOSPITAL_COMMUNITY): Payer: Medicare PPO

## 2022-04-13 ENCOUNTER — Other Ambulatory Visit: Payer: Self-pay

## 2022-04-13 ENCOUNTER — Inpatient Hospital Stay (HOSPITAL_COMMUNITY): Payer: Medicare PPO | Admitting: Anesthesiology

## 2022-04-13 ENCOUNTER — Encounter (HOSPITAL_COMMUNITY): Payer: Self-pay | Admitting: Orthopedic Surgery

## 2022-04-13 ENCOUNTER — Inpatient Hospital Stay (HOSPITAL_COMMUNITY)
Admission: RE | Admit: 2022-04-13 | Discharge: 2022-04-18 | DRG: 454 | Disposition: A | Discharge status: Skilled Nursing Facility | Payer: Medicare PPO | Attending: Orthopedic Surgery | Admitting: Orthopedic Surgery

## 2022-04-13 DIAGNOSIS — M5410 Radiculopathy, site unspecified: Principal | ICD-10-CM | POA: Diagnosis present

## 2022-04-13 DIAGNOSIS — M5416 Radiculopathy, lumbar region: Principal | ICD-10-CM | POA: Diagnosis present

## 2022-04-13 DIAGNOSIS — R531 Weakness: Secondary | ICD-10-CM | POA: Diagnosis present

## 2022-04-13 DIAGNOSIS — K219 Gastro-esophageal reflux disease without esophagitis: Secondary | ICD-10-CM | POA: Diagnosis present

## 2022-04-13 DIAGNOSIS — M48061 Spinal stenosis, lumbar region without neurogenic claudication: Secondary | ICD-10-CM | POA: Diagnosis present

## 2022-04-13 DIAGNOSIS — Z79899 Other long term (current) drug therapy: Secondary | ICD-10-CM

## 2022-04-13 DIAGNOSIS — Z885 Allergy status to narcotic agent status: Secondary | ICD-10-CM

## 2022-04-13 DIAGNOSIS — Z6841 Body Mass Index (BMI) 40.0 and over, adult: Secondary | ICD-10-CM | POA: Diagnosis not present

## 2022-04-13 DIAGNOSIS — Z888 Allergy status to other drugs, medicaments and biological substances status: Secondary | ICD-10-CM | POA: Diagnosis not present

## 2022-04-13 DIAGNOSIS — Z981 Arthrodesis status: Secondary | ICD-10-CM

## 2022-04-13 DIAGNOSIS — M62838 Other muscle spasm: Secondary | ICD-10-CM | POA: Diagnosis not present

## 2022-04-13 DIAGNOSIS — J45909 Unspecified asthma, uncomplicated: Secondary | ICD-10-CM | POA: Diagnosis present

## 2022-04-13 DIAGNOSIS — I10 Essential (primary) hypertension: Secondary | ICD-10-CM | POA: Diagnosis present

## 2022-04-13 HISTORY — PX: TRANSFORAMINAL LUMBAR INTERBODY FUSION (TLIF) WITH PEDICLE SCREW FIXATION 1 LEVEL: SHX6141

## 2022-04-13 HISTORY — PX: ALLOGRAFT APPLICATION: SHX6404

## 2022-04-13 SURGERY — TRANSFORAMINAL LUMBAR INTERBODY FUSION (TLIF) WITH PEDICLE SCREW FIXATION 1 LEVEL
Anesthesia: General | Laterality: Left

## 2022-04-13 MED ORDER — OXYCODONE HCL 5 MG PO TABS: 5.0000 mg | ORAL_TABLET | Freq: Once | ORAL | Status: DC | PRN

## 2022-04-13 MED ORDER — HYDROCHLOROTHIAZIDE 25 MG PO TABS
25.0000 mg | ORAL_TABLET | Freq: Every day | ORAL | Status: DC
  Administered 2022-04-13 – 2022-04-18 (×5): 25 mg via ORAL
  Filled 2022-04-13 (×6): qty 1

## 2022-04-13 MED ORDER — VITAMIN D 25 MCG (1000 UNIT) PO TABS
2000.0000 [IU] | ORAL_TABLET | Freq: Every day | ORAL | Status: DC
  Administered 2022-04-13 – 2022-04-18 (×6): 2000 [IU] via ORAL
  Filled 2022-04-13 (×6): qty 2

## 2022-04-13 MED ORDER — ROCURONIUM BROMIDE 10 MG/ML (PF) SYRINGE
PREFILLED_SYRINGE | INTRAVENOUS | Status: AC
  Filled 2022-04-13: qty 10

## 2022-04-13 MED ORDER — GLYCOPYRROLATE 0.2 MG/ML IJ SOLN
INTRAMUSCULAR | Status: DC | PRN
  Administered 2022-04-13: .2 mg via INTRAVENOUS

## 2022-04-13 MED ORDER — EPHEDRINE SULFATE (PRESSORS) 50 MG/ML IJ SOLN
INTRAMUSCULAR | Status: DC | PRN
  Administered 2022-04-13: 10 mg via INTRAVENOUS
  Administered 2022-04-13 (×2): 5 mg via INTRAVENOUS

## 2022-04-13 MED ORDER — PHENYLEPHRINE HCL (PRESSORS) 10 MG/ML IV SOLN
INTRAVENOUS | Status: DC | PRN
  Administered 2022-04-13: 80 ug via INTRAVENOUS

## 2022-04-13 MED ORDER — TAMSULOSIN HCL 0.4 MG PO CAPS: 0.8000 mg | ORAL_CAPSULE | Freq: Every day | ORAL | Status: DC

## 2022-04-13 MED ORDER — BUPIVACAINE LIPOSOME 1.3 % IJ SUSP
INTRAMUSCULAR | Status: AC
Start: 2022-04-13 — End: ?
  Filled 2022-04-13: qty 20

## 2022-04-13 MED ORDER — BUPIVACAINE-EPINEPHRINE 0.25% -1:200000 IJ SOLN
INTRAMUSCULAR | Status: DC | PRN
  Administered 2022-04-13: 20 mL
  Administered 2022-04-13: 10 mL

## 2022-04-13 MED ORDER — ONDANSETRON HCL 4 MG PO TABS: 4.0000 mg | ORAL_TABLET | Freq: Four times a day (QID) | ORAL | Status: DC | PRN

## 2022-04-13 MED ORDER — HYDROMORPHONE HCL 1 MG/ML IJ SOLN: 0.2500 mg | INTRAMUSCULAR | Status: DC | PRN

## 2022-04-13 MED ORDER — LACTATED RINGERS IV SOLN: INTRAVENOUS | Status: DC | PRN

## 2022-04-13 MED ORDER — ATENOLOL 25 MG PO TABS
50.0000 mg | ORAL_TABLET | Freq: Every day | ORAL | Status: DC
  Administered 2022-04-14 – 2022-04-18 (×4): 50 mg via ORAL
  Filled 2022-04-13 (×4): qty 2
  Filled 2022-04-13: qty 1

## 2022-04-13 MED ORDER — FLEET ENEMA 7-19 GM/118ML RE ENEM
1.0000 | ENEMA | Freq: Once | RECTAL | Status: AC | PRN
  Administered 2022-04-16: 1 via RECTAL
  Filled 2022-04-13: qty 1

## 2022-04-13 MED ORDER — LACTATED RINGERS IV SOLN: INTRAVENOUS | Status: DC

## 2022-04-13 MED ORDER — ACETAMINOPHEN 325 MG PO TABS
650.0000 mg | ORAL_TABLET | ORAL | Status: DC | PRN
  Administered 2022-04-15 (×2): 650 mg via ORAL
  Filled 2022-04-13 (×2): qty 2

## 2022-04-13 MED ORDER — ACETAMINOPHEN 650 MG RE SUPP: 650.0000 mg | RECTAL | Status: DC | PRN

## 2022-04-13 MED ORDER — ALUM & MAG HYDROXIDE-SIMETH 200-200-20 MG/5ML PO SUSP
30.0000 mL | Freq: Four times a day (QID) | ORAL | Status: DC | PRN
  Administered 2022-04-14 – 2022-04-15 (×5): 30 mL via ORAL
  Filled 2022-04-13 (×5): qty 30

## 2022-04-13 MED ORDER — ZOLPIDEM TARTRATE 5 MG PO TABS
5.0000 mg | ORAL_TABLET | Freq: Every evening | ORAL | Status: DC | PRN
  Administered 2022-04-17: 5 mg via ORAL
  Filled 2022-04-13: qty 1

## 2022-04-13 MED ORDER — OXYCODONE HCL 5 MG/5ML PO SOLN: 5.0000 mg | Freq: Once | ORAL | Status: DC | PRN

## 2022-04-13 MED ORDER — PROPOFOL 10 MG/ML IV BOLUS
INTRAVENOUS | Status: AC
  Filled 2022-04-13: qty 20

## 2022-04-13 MED ORDER — SUGAMMADEX SODIUM 200 MG/2ML IV SOLN
INTRAVENOUS | Status: DC | PRN
  Administered 2022-04-13: 200 mg via INTRAVENOUS

## 2022-04-13 MED ORDER — METHOCARBAMOL 1000 MG/10ML IJ SOLN: 500.0000 mg | Freq: Four times a day (QID) | INTRAVENOUS | Status: DC | PRN

## 2022-04-13 MED ORDER — SODIUM CHLORIDE 0.9 % IV SOLN: 250.0000 mL | INTRAVENOUS | Status: DC

## 2022-04-13 MED ORDER — ACETAMINOPHEN 10 MG/ML IV SOLN
INTRAVENOUS | Status: AC
  Filled 2022-04-13: qty 100

## 2022-04-13 MED ORDER — OXYCODONE-ACETAMINOPHEN 5-325 MG PO TABS
1.0000 | ORAL_TABLET | ORAL | Status: DC | PRN
  Administered 2022-04-13 – 2022-04-14 (×5): 2 via ORAL
  Administered 2022-04-15: 1 via ORAL
  Administered 2022-04-16 – 2022-04-18 (×11): 2 via ORAL
  Filled 2022-04-13 (×5): qty 2
  Filled 2022-04-13: qty 1
  Filled 2022-04-13 (×3): qty 2
  Filled 2022-04-13: qty 1
  Filled 2022-04-13 (×7): qty 2
  Filled 2022-04-13: qty 1

## 2022-04-13 MED ORDER — DOCUSATE SODIUM 100 MG PO CAPS
100.0000 mg | ORAL_CAPSULE | Freq: Two times a day (BID) | ORAL | Status: DC
  Administered 2022-04-13 – 2022-04-18 (×11): 100 mg via ORAL
  Filled 2022-04-13 (×11): qty 1

## 2022-04-13 MED ORDER — ORAL CARE MOUTH RINSE: 15.0000 mL | Freq: Once | OROMUCOSAL | Status: AC

## 2022-04-13 MED ORDER — CEFAZOLIN SODIUM-DEXTROSE 2-4 GM/100ML-% IV SOLN
2.0000 g | Freq: Three times a day (TID) | INTRAVENOUS | Status: AC
  Administered 2022-04-13 – 2022-04-14 (×2): 2 g via INTRAVENOUS
  Filled 2022-04-13 (×2): qty 100

## 2022-04-13 MED ORDER — PHENOL 1.4 % MT LIQD
1.0000 | OROMUCOSAL | Status: DC | PRN
Start: 2022-04-13 — End: 2022-04-18

## 2022-04-13 MED ORDER — DEXAMETHASONE SODIUM PHOSPHATE 10 MG/ML IJ SOLN
INTRAMUSCULAR | Status: AC
  Filled 2022-04-13: qty 1

## 2022-04-13 MED ORDER — SUFENTANIL CITRATE 50 MCG/ML IV SOLN
INTRAVENOUS | Status: AC
  Filled 2022-04-13: qty 1

## 2022-04-13 MED ORDER — PROMETHAZINE HCL 25 MG/ML IJ SOLN: 6.2500 mg | INTRAMUSCULAR | Status: DC | PRN

## 2022-04-13 MED ORDER — SODIUM CHLORIDE 0.9% FLUSH
3.0000 mL | Freq: Two times a day (BID) | INTRAVENOUS | Status: DC
Start: 2022-04-13 — End: 2022-04-18
  Administered 2022-04-13 – 2022-04-18 (×10): 3 mL via INTRAVENOUS

## 2022-04-13 MED ORDER — FINASTERIDE 5 MG PO TABS
5.0000 mg | ORAL_TABLET | Freq: Every day | ORAL | Status: DC
  Administered 2022-04-13 – 2022-04-18 (×6): 5 mg via ORAL
  Filled 2022-04-13 (×6): qty 1

## 2022-04-13 MED ORDER — DEXAMETHASONE SODIUM PHOSPHATE 10 MG/ML IJ SOLN
INTRAMUSCULAR | Status: DC | PRN
  Administered 2022-04-13: 10 mg via INTRAVENOUS

## 2022-04-13 MED ORDER — MIDAZOLAM HCL 2 MG/2ML IJ SOLN
INTRAMUSCULAR | Status: AC
  Filled 2022-04-13: qty 2

## 2022-04-13 MED ORDER — MENTHOL 3 MG MT LOZG: 1.0000 | LOZENGE | OROMUCOSAL | Status: DC | PRN

## 2022-04-13 MED ORDER — MEPERIDINE HCL 25 MG/ML IJ SOLN: 6.2500 mg | INTRAMUSCULAR | Status: DC | PRN

## 2022-04-13 MED ORDER — BUPIVACAINE-EPINEPHRINE (PF) 0.25% -1:200000 IJ SOLN
INTRAMUSCULAR | Status: AC
  Filled 2022-04-13: qty 30

## 2022-04-13 MED ORDER — SENNOSIDES-DOCUSATE SODIUM 8.6-50 MG PO TABS
1.0000 | ORAL_TABLET | Freq: Every evening | ORAL | Status: DC | PRN
  Administered 2022-04-17: 1 via ORAL
  Filled 2022-04-13: qty 1

## 2022-04-13 MED ORDER — GLYCOPYRROLATE PF 0.2 MG/ML IJ SOSY
PREFILLED_SYRINGE | INTRAMUSCULAR | Status: AC
  Filled 2022-04-13: qty 1

## 2022-04-13 MED ORDER — ONDANSETRON HCL 4 MG/2ML IJ SOLN
4.0000 mg | Freq: Four times a day (QID) | INTRAMUSCULAR | Status: DC | PRN
  Administered 2022-04-14: 4 mg via INTRAVENOUS
  Filled 2022-04-13: qty 2

## 2022-04-13 MED ORDER — POTASSIUM CHLORIDE IN NACL 20-0.9 MEQ/L-% IV SOLN
INTRAVENOUS | Status: DC
  Filled 2022-04-13: qty 1000

## 2022-04-13 MED ORDER — CEFAZOLIN IN SODIUM CHLORIDE 3-0.9 GM/100ML-% IV SOLN
3.0000 g | INTRAVENOUS | Status: AC
  Administered 2022-04-13: 3 g via INTRAVENOUS
  Filled 2022-04-13: qty 100

## 2022-04-13 MED ORDER — THROMBIN 20000 UNITS EX KIT
PACK | CUTANEOUS | Status: AC
  Filled 2022-04-13: qty 1

## 2022-04-13 MED ORDER — PROPOFOL 10 MG/ML IV BOLUS
INTRAVENOUS | Status: DC | PRN
  Administered 2022-04-13: 150 mg via INTRAVENOUS

## 2022-04-13 MED ORDER — POVIDONE-IODINE 7.5 % EX SOLN
Freq: Once | CUTANEOUS | Status: AC
  Filled 2022-04-13: qty 118

## 2022-04-13 MED ORDER — MIDAZOLAM HCL 2 MG/2ML IJ SOLN: 0.5000 mg | Freq: Once | INTRAMUSCULAR | Status: DC | PRN

## 2022-04-13 MED ORDER — CHLORHEXIDINE GLUCONATE 0.12 % MT SOLN
15.0000 mL | Freq: Once | OROMUCOSAL | Status: AC
  Administered 2022-04-13: 15 mL via OROMUCOSAL
  Filled 2022-04-13: qty 15

## 2022-04-13 MED ORDER — HYDROCODONE-ACETAMINOPHEN 5-325 MG PO TABS
1.0000 | ORAL_TABLET | ORAL | Status: DC | PRN
  Administered 2022-04-14 – 2022-04-15 (×2): 1 via ORAL
  Filled 2022-04-13 (×5): qty 1

## 2022-04-13 MED ORDER — ONDANSETRON HCL 4 MG/2ML IJ SOLN
INTRAMUSCULAR | Status: AC
  Filled 2022-04-13: qty 2

## 2022-04-13 MED ORDER — MORPHINE SULFATE (PF) 2 MG/ML IV SOLN: 1.0000 mg | INTRAVENOUS | Status: DC | PRN

## 2022-04-13 MED ORDER — ACETAMINOPHEN 10 MG/ML IV SOLN
INTRAVENOUS | Status: DC | PRN
  Administered 2022-04-13: 1000 mg via INTRAVENOUS

## 2022-04-13 MED ORDER — ROCURONIUM 10MG/ML (10ML) SYRINGE FOR MEDFUSION PUMP - OPTIME
INTRAVENOUS | Status: DC | PRN
  Administered 2022-04-13 (×2): 100 mg via INTRAVENOUS

## 2022-04-13 MED ORDER — SUFENTANIL CITRATE 50 MCG/ML IV SOLN
INTRAVENOUS | Status: DC | PRN
  Administered 2022-04-13 (×3): 10 ug via INTRAVENOUS

## 2022-04-13 MED ORDER — BUPIVACAINE LIPOSOME 1.3 % IJ SUSP
INTRAMUSCULAR | Status: DC | PRN
  Administered 2022-04-13: 20 mL

## 2022-04-13 MED ORDER — EPHEDRINE 5 MG/ML INJ
INTRAVENOUS | Status: AC
  Filled 2022-04-13: qty 5

## 2022-04-13 MED ORDER — THROMBIN 20000 UNITS EX SOLR
CUTANEOUS | Status: DC | PRN
  Administered 2022-04-13: 20000 [IU] via TOPICAL

## 2022-04-13 MED ORDER — METHOCARBAMOL 500 MG PO TABS
500.0000 mg | ORAL_TABLET | Freq: Four times a day (QID) | ORAL | Status: DC | PRN
  Administered 2022-04-13 – 2022-04-18 (×5): 500 mg via ORAL
  Filled 2022-04-13 (×5): qty 1

## 2022-04-13 MED ORDER — BISACODYL 5 MG PO TBEC
5.0000 mg | DELAYED_RELEASE_TABLET | Freq: Every day | ORAL | Status: DC | PRN
  Administered 2022-04-16: 5 mg via ORAL
  Filled 2022-04-13: qty 1

## 2022-04-13 MED ORDER — SODIUM CHLORIDE 0.9% FLUSH: 3.0000 mL | INTRAVENOUS | Status: DC | PRN

## 2022-04-13 MED ORDER — HYDRALAZINE HCL 10 MG PO TABS
10.0000 mg | ORAL_TABLET | Freq: Every day | ORAL | Status: DC
  Administered 2022-04-13 – 2022-04-18 (×5): 10 mg via ORAL
  Filled 2022-04-13 (×6): qty 1

## 2022-04-13 MED ORDER — ONDANSETRON HCL 4 MG/2ML IJ SOLN
INTRAMUSCULAR | Status: DC | PRN
  Administered 2022-04-13: 4 mg via INTRAVENOUS

## 2022-04-13 MED ORDER — PHENYLEPHRINE HCL-NACL 20-0.9 MG/250ML-% IV SOLN
INTRAVENOUS | Status: DC | PRN
  Administered 2022-04-13: 25 ug/min via INTRAVENOUS

## 2022-04-13 MED ORDER — 0.9 % SODIUM CHLORIDE (POUR BTL) OPTIME
TOPICAL | Status: DC | PRN
  Administered 2022-04-13 (×2): 1000 mL

## 2022-04-13 MED ORDER — GABAPENTIN 300 MG PO CAPS
900.0000 mg | ORAL_CAPSULE | Freq: Every day | ORAL | Status: DC
  Administered 2022-04-13 – 2022-04-17 (×5): 900 mg via ORAL
  Filled 2022-04-13 (×5): qty 3

## 2022-04-13 MED ORDER — ACETAMINOPHEN 500 MG PO TABS
1000.0000 mg | ORAL_TABLET | Freq: Once | ORAL | Status: DC
  Filled 2022-04-13: qty 2

## 2022-04-13 MED ORDER — LIDOCAINE 2% (20 MG/ML) 5 ML SYRINGE
INTRAMUSCULAR | Status: DC | PRN
  Administered 2022-04-13: 100 mg via INTRAVENOUS

## 2022-04-13 SURGICAL SUPPLY — 95 items
AGENT HMST KT MTR STRL THRMB (HEMOSTASIS)
APL SKNCLS STERI-STRIP NONHPOA (GAUZE/BANDAGES/DRESSINGS) ×1
BAG COUNTER SPONGE SURGICOUNT (BAG) ×3 IMPLANT
BAG SPNG CNTER NS LX DISP (BAG) ×1
BENZOIN TINCTURE PRP APPL 2/3 (GAUZE/BANDAGES/DRESSINGS) ×3 IMPLANT
BLADE CLIPPER SURG (BLADE) ×1 IMPLANT
BUR PRESCISION 1.7 ELITE (BURR) ×3 IMPLANT
BUR ROUND FLUTED 5 RND (BURR) ×4 IMPLANT
BUR ROUND PRECISION 4.0 (BURR) IMPLANT
BUR SABER RD CUTTING 3.0 (BURR) IMPLANT
CAGE SABLE 10X30 9-16 8D (Cage) ×1 IMPLANT
CANNULA GRAFT BNE VG PRE-FILL (Bone Implant) IMPLANT
CARTRIDGE OIL MAESTRO DRILL (MISCELLANEOUS) ×2 IMPLANT
CLSR STERI-STRIP ANTIMIC 1/2X4 (GAUZE/BANDAGES/DRESSINGS) ×1 IMPLANT
CNTNR URN SCR LID CUP LEK RST (MISCELLANEOUS) ×2 IMPLANT
CONT SPEC 4OZ STRL OR WHT (MISCELLANEOUS) ×2
COVER BACK TABLE 60X90IN (DRAPES) ×2 IMPLANT
COVER MAYO STAND STRL (DRAPES) ×5 IMPLANT
COVER SURGICAL LIGHT HANDLE (MISCELLANEOUS) ×3 IMPLANT
DIFFUSER DRILL AIR PNEUMATIC (MISCELLANEOUS) ×3 IMPLANT
DISPENSER GRAFT BNE VG (MISCELLANEOUS) IMPLANT
DISPENSER VIVIGEN BONE GRAFT (MISCELLANEOUS) ×2 IMPLANT
DRAIN CHANNEL 15F RND FF W/TCR (WOUND CARE) IMPLANT
DRAPE C-ARM 42X72 X-RAY (DRAPES) ×3 IMPLANT
DRAPE C-ARMOR (DRAPES) IMPLANT
DRAPE POUCH INSTRU U-SHP 10X18 (DRAPES) ×3 IMPLANT
DRAPE SURG 17X23 STRL (DRAPES) ×12 IMPLANT
DURAPREP 26ML APPLICATOR (WOUND CARE) ×3 IMPLANT
ELECT BLADE 4.0 EZ CLEAN MEGAD (MISCELLANEOUS) ×2
ELECT CAUTERY BLADE 6.4 (BLADE) ×3 IMPLANT
ELECT REM PT RETURN 9FT ADLT (ELECTROSURGICAL) ×2
ELECTRODE BLDE 4.0 EZ CLN MEGD (MISCELLANEOUS) ×2 IMPLANT
ELECTRODE REM PT RTRN 9FT ADLT (ELECTROSURGICAL) ×2 IMPLANT
EVACUATOR SILICONE 100CC (DRAIN) IMPLANT
FILTER STRAW FLUID ASPIR (MISCELLANEOUS) ×2 IMPLANT
GAUZE 4X4 16PLY ~~LOC~~+RFID DBL (SPONGE) ×3 IMPLANT
GAUZE SPONGE 4X4 12PLY STRL (GAUZE/BANDAGES/DRESSINGS) ×3 IMPLANT
GLOVE BIO SURGEON STRL SZ7 (GLOVE) ×3 IMPLANT
GLOVE BIO SURGEON STRL SZ8 (GLOVE) ×3 IMPLANT
GLOVE BIOGEL PI IND STRL 7.0 (GLOVE) ×2 IMPLANT
GLOVE BIOGEL PI IND STRL 8 (GLOVE) ×2 IMPLANT
GLOVE BIOGEL PI INDICATOR 7.0 (GLOVE) ×1
GLOVE BIOGEL PI INDICATOR 8 (GLOVE) ×1
GLOVE SURG ENC MOIS LTX SZ6.5 (GLOVE) ×3 IMPLANT
GOWN STRL REUS W/ TWL LRG LVL3 (GOWN DISPOSABLE) ×4 IMPLANT
GOWN STRL REUS W/ TWL XL LVL3 (GOWN DISPOSABLE) ×2 IMPLANT
GOWN STRL REUS W/TWL LRG LVL3 (GOWN DISPOSABLE) ×4
GOWN STRL REUS W/TWL XL LVL3 (GOWN DISPOSABLE) ×2
GRAFT BONE CANNULA VIVIGEN 3 (Bone Implant) ×8 IMPLANT
IV CATH 14GX2 1/4 (CATHETERS) ×3 IMPLANT
KIT BASIN OR (CUSTOM PROCEDURE TRAY) ×3 IMPLANT
KIT INFUSE X SMALL 1.4CC (Orthopedic Implant) ×1 IMPLANT
KIT POSITION SURG JACKSON T1 (MISCELLANEOUS) ×3 IMPLANT
KIT TURNOVER KIT B (KITS) ×3 IMPLANT
MARKER SKIN DUAL TIP RULER LAB (MISCELLANEOUS) ×7 IMPLANT
NDL 18GX1X1/2 (RX/OR ONLY) (NEEDLE) ×2 IMPLANT
NDL HYPO 25GX1X1/2 BEV (NEEDLE) ×2 IMPLANT
NDL SPNL 18GX3.5 QUINCKE PK (NEEDLE) ×4 IMPLANT
NEEDLE 18GX1X1/2 (RX/OR ONLY) (NEEDLE) ×2 IMPLANT
NEEDLE 22X1 1/2 (OR ONLY) (NEEDLE) ×6 IMPLANT
NEEDLE HYPO 25GX1X1/2 BEV (NEEDLE) ×2 IMPLANT
NEEDLE SPNL 18GX3.5 QUINCKE PK (NEEDLE) ×4 IMPLANT
NS IRRIG 1000ML POUR BTL (IV SOLUTION) ×3 IMPLANT
OIL CARTRIDGE MAESTRO DRILL (MISCELLANEOUS) ×2
PACK LAMINECTOMY ORTHO (CUSTOM PROCEDURE TRAY) ×3 IMPLANT
PACK UNIVERSAL I (CUSTOM PROCEDURE TRAY) ×3 IMPLANT
PAD ARMBOARD 7.5X6 YLW CONV (MISCELLANEOUS) ×6 IMPLANT
PATTIES SURGICAL .5 X1 (DISPOSABLE) ×2 IMPLANT
PATTIES SURGICAL .5X1.5 (GAUZE/BANDAGES/DRESSINGS) ×2 IMPLANT
ROD EXPEDIUM PRE BENT 5.5X75 (Rod) ×2 IMPLANT
SCREW POLY VIPER MIS 8X45 (Screw) ×1 IMPLANT
SCREW SET SINGLE INNER (Screw) ×6 IMPLANT
SCREW VIPER 8X40MM (Screw) ×2 IMPLANT
SPONGE INTESTINAL PEANUT (DISPOSABLE) ×3 IMPLANT
SPONGE SURGIFOAM ABS GEL 100 (HEMOSTASIS) ×3 IMPLANT
SPONGE T-LAP 4X18 ~~LOC~~+RFID (SPONGE) ×3 IMPLANT
STRIP CLOSURE SKIN 1/2X4 (GAUZE/BANDAGES/DRESSINGS) ×6 IMPLANT
SURGIFLO W/THROMBIN 8M KIT (HEMOSTASIS) IMPLANT
SUT MNCRL AB 4-0 PS2 18 (SUTURE) ×3 IMPLANT
SUT VIC AB 0 CT1 18XCR BRD 8 (SUTURE) ×2 IMPLANT
SUT VIC AB 0 CT1 8-18 (SUTURE) ×2
SUT VIC AB 1 CT1 18XCR BRD 8 (SUTURE) ×2 IMPLANT
SUT VIC AB 1 CT1 8-18 (SUTURE) ×2
SUT VIC AB 2-0 CT2 18 VCP726D (SUTURE) ×3 IMPLANT
SYR 20ML LL LF (SYRINGE) ×6 IMPLANT
SYR BULB IRRIG 60ML STRL (SYRINGE) ×3 IMPLANT
SYR CONTROL 10ML LL (SYRINGE) ×6 IMPLANT
SYR TB 1ML LUER SLIP (SYRINGE) ×2 IMPLANT
TAP EXPEDIUM DL 6.0 (INSTRUMENTS) ×1 IMPLANT
TAP EXPEDIUM DL 7.0 (INSTRUMENTS) ×2
TAP EXPEDIUM DL 7X2 (INSTRUMENTS) IMPLANT
TAPE CLOTH SURG 4X10 WHT LF (GAUZE/BANDAGES/DRESSINGS) ×1 IMPLANT
TRAY FOLEY MTR SLVR 16FR STAT (SET/KITS/TRAYS/PACK) ×3 IMPLANT
WATER STERILE IRR 1000ML POUR (IV SOLUTION) ×3 IMPLANT
YANKAUER SUCT BULB TIP NO VENT (SUCTIONS) ×3 IMPLANT

## 2022-04-13 NOTE — Anesthesia Postprocedure Evaluation (Signed)
Anesthesia Post Note  Patient: Evan Morgan  Procedure(s) Performed: HARDWARE REMOVAL  LEFT-SIDED LUMBAR 4 - LUMBAR 5 TRANSFORAMINAL LUMBAR INTERBODY FUSION AND DECOMPRESSION WITH INSTRUMENTATION (Left) ALLOGRAFT APPLICATION (Left)     Patient location during evaluation: PACU Anesthesia Type: General Level of consciousness: patient cooperative, oriented and sedated Pain management: pain level controlled Vital Signs Assessment: post-procedure vital signs reviewed and stable Respiratory status: spontaneous breathing, nonlabored ventilation and respiratory function stable Cardiovascular status: blood pressure returned to baseline and stable Postop Assessment: no apparent nausea or vomiting Anesthetic complications: no   No notable events documented.  Last Vitals:  Vitals:   04/13/22 1215 04/13/22 1230  BP: 139/78 117/70  Pulse: 60 (!) 58  Resp: 12 11  Temp:    SpO2: 94% 95%    Last Pain:  Vitals:   04/13/22 1215  TempSrc:   PainSc: Asleep                 Jahmeer Porche,E. Temesha Queener

## 2022-04-13 NOTE — Op Note (Signed)
PATIENT NAME: Evan Morgan   MEDICAL RECORD NO.:   [id number]   DATE OF BIRTH: Jun 26, 1951   DATE OF PROCEDURE: 04/13/2022                               OPERATIVE REPORT     PREOPERATIVE DIAGNOSES: 1. Bilateral lumbar radiculopathy. 2. Severe L4-5 spinal stenosis. 3. S/p previous L2/3 and L3/4 fusion with instrumentation   POSTOPERATIVE DIAGNOSES: 1. Bilateral lumbar radiculopathy. 2. Severe L4-5 spinal stenosis. 3. S/p previous L2/3 and L3/4 fusion with instrumentation   PROCEDURES: 1.  L4/5 decompression 2.  Left-sided L4-5 transforaminal lumbar interbody fusion. 3.  Right-sided L4-5 posterolateral fusion. 4.  Insertion of interbody device x1 (Globus expandable intervertebral spacer). 5.  Placement of segmental posterior instrumentation at L5 bilaterally  6.  Removal of bilateral posterior instrumentation, L3 7.  Use of local autograft. 8.  Use of morselized allograft - Vivigen 9. Intraoperative use of fluoroscopy. 10. Exploration of spinal fusion L2-3, L3-4   SURGEON:  Estill Bamberg, MD.   ASSISTANTJason Coop, PA-C.   ANESTHESIA:  General endotracheal anesthesia.   COMPLICATIONS:  None.   DISPOSITION:  Stable.   ESTIMATED BLOOD LOSS:  100cc   INDICATIONS FOR SURGERY:  Briefly,  Evan Morgan is a pleasant 71 year old male who did present to me with significant progressive bilateral leg pain and weakness.  He is status post a fusion involving L2-3 and L3-4.  An updated MRI did reveal severe stenosis at L4-5, including a large left-sided L4-5 facet cyst. I did feel that his symptoms were likely secondary to the findings noted above, given the prominence of the stenosis and the prominence of his symptoms.   The patient failed conservative care and did wish to proceed with the procedure  noted above.   OPERATIVE DETAILS:  On 04/13/2022, the patient was brought to surgery and general endotracheal anesthesia was administered.  The patient was placed prone on a  well-padded flat Jackson bed with a spinal frame.  Antibiotics were given and a time-out procedure was performed. The back was prepped and draped in the usual fashion.  A midline incision was made overlying the L2-3, L3-4, and L4-5 intervertebral spaces.  The fascia was incised at the midline.  The paraspinal musculature was bluntly swept laterally.  At this point, the previously placed hardware was identified, and the bilateral caps from L2-L4 were removed, as were the bilateral interconnecting rods.  At this point, I did thoroughly explore the fusion at L2-3 and L3-4 by subperiosteally exposing the bilateral facet joints.  I then firmly grabbed a hold of the pedicle screws at L2 and L3, and performed a pushing and pulling maneuver.  No motion was noted, confirming a successful fusion.  The same process was performed at L3-4, and again, no motion was noted across the L3-4 segment, confirming a successful fusion across both L2-3 and L3-4. At this point, the bilateral L2 pedicle screws were removed, and bone wax was placed in their place.  Then, using anatomic landmarks in addition to AP and lateral fluoroscopy, I did cannulate the L5 pedicles using a standard lateral to medial trajectory.  On the right, a 8 x 40 mm screw was placed.  On the right, an interconnecting rod was secured into the tulip heads of L3, L4, and L5.  Caps were placed and a final locking procedure was performed.  Bone wax was placed into the cannulated the  left L5 pedicle screw.  At this point, I turned my attention to the decompression.  A partial facetectomy was performed bilaterally at L4-5, decompressing the L4-5 intervertebral space.  A large left-sided L4-5 facet cyst was noted, and was removed in its entirety.  I was very pleased with the decompression. With an assistant holding medial retraction of the traversing left L5 nerve, I did perform an annulotomy at the posterolateral aspect of the L4-5 intervertebral space.  I then used  a series of curettes and pituitary rongeurs to perform a thorough and complete intervertebral diskectomy.  The intervertebral space was then liberally packed with autograft as well as allograft in the form of Vivigen, as was the appropriate-sized intervertebral spacer.  I did also utilize an extra small pack of BMP, which was packed into the intervertebral space as well, as the patient did have nonunions in the past.  The spacer was then tamped into position in the usual fashion, and expanded to approximately 15.2 mm in height.  I was very pleased with the press-fit of the spacer.  I then placed a 8 x 45 mm screw on the left at L5.  A interconnecting rod was then secured into the tulip heads of the screws on the left.  Caps were placed over the rod at the left L3, L4, and L5 pedicle screws.  Caps were placed and a final locking procedure was performed.  The right L4-5 facet joint and posterolateral gutter was thoroughly decorticated, and autograft and allograft was packed along the posterolateral gutter and facet joint, to help aid in the success of the fusion. The wound was copiously irrigated with a total of approximately 3 L prior to placing the bone graft.  The wound was explored for any undue bleeding and there was no substantial bleeding encountered.  Gel-Foam was placed over the laminectomy site.  The wound was then closed in layers using #1 Vicryl followed by 2-0 Vicryl, followed by 4-0 Monocryl.  Benzoin and Steri-Strips were applied followed by sterile dressing.      Of note, Jason Coop was my assistant throughout surgery, and did aid in retraction, suctioning, placement of the hardware, and closure.       Estill Bamberg, MD

## 2022-04-13 NOTE — Transfer of Care (Signed)
Immediate Anesthesia Transfer of Care Note  Patient: Evan Morgan  Procedure(s) Performed: HARDWARE REMOVAL  LEFT-SIDED LUMBAR 4 - LUMBAR 5 TRANSFORAMINAL LUMBAR INTERBODY FUSION AND DECOMPRESSION WITH INSTRUMENTATION (Left) ALLOGRAFT APPLICATION (Left)  Patient Location: PACU  Anesthesia Type:General  Level of Consciousness: drowsy, patient cooperative and responds to stimulation  Airway & Oxygen Therapy: Patient Spontanous Breathing and Patient connected to nasal cannula oxygen  Post-op Assessment: Report given to RN, Post -op Vital signs reviewed and stable and Patient moving all extremities X 4  Post vital signs: Reviewed and stable  Last Vitals:  Vitals Value Taken Time  BP 135/79 04/13/22 1153  Temp 36.4 C 04/13/22 1150  Pulse 59 04/13/22 1156  Resp 10 04/13/22 1156  SpO2 100 % 04/13/22 1156  Vitals shown include unvalidated device data.  Last Pain:  Vitals:   04/13/22 0627  TempSrc:   PainSc: 8          Complications: No notable events documented.

## 2022-04-13 NOTE — Anesthesia Procedure Notes (Signed)
Procedure Name: Intubation Date/Time: 04/13/2022 7:43 AM  Performed by: Claris Che, CRNAPre-anesthesia Checklist: Patient identified, Emergency Drugs available, Suction available, Patient being monitored and Timeout performed Patient Re-evaluated:Patient Re-evaluated prior to induction Oxygen Delivery Method: Circle system utilized Preoxygenation: Pre-oxygenation with 100% oxygen Induction Type: IV induction and Cricoid Pressure applied Ventilation: Mask ventilation without difficulty Laryngoscope Size: Mac and 4 Grade View: Grade II Tube type: Oral Tube size: 8.0 mm Number of attempts: 1 Airway Equipment and Method: Stylet Placement Confirmation: ETT inserted through vocal cords under direct vision, positive ETCO2 and breath sounds checked- equal and bilateral Secured at: 23 cm Tube secured with: Tape Dental Injury: Teeth and Oropharynx as per pre-operative assessment

## 2022-04-13 NOTE — H&P (Signed)
PREOPERATIVE H&P  Chief Complaint: Bilateral lower extremity pain and weakness  HPI: Evan Morgan is a 71 y.o. male who presents with ongoing pain and weakness in the bilateral lower extremitites  MRI reveals severe stenosis and a large facet cyst at L4/5   Patient has failed multiple forms of conservative care and continues to have pain (see office notes for additional details regarding the patient's full course of treatment)  Past Medical History:  Diagnosis Date   Arthritis    knees   Asthma    As a child   History of hepatitis C    Hypertension    Past Surgical History:  Procedure Laterality Date   ANTERIOR LAT LUMBAR FUSION  07/23/2019   Procedure: LUMBAR TWO-THREE LEFT LATERAL INTERBODY FUSION WITH INSTRUMENTATION AND ALLOGRAFT;  Surgeon: Phylliss Bob, MD;  Location: [place];  Service: Orthopedics;;   BACK SURGERY     x3   Social History   Socioeconomic History   Marital status: Single    Spouse name: Not on file   Number of children: 2   Years of education: Not on file   Highest education level: Not on file  Occupational History   Not on file  Tobacco Use   Smoking status: Never   Smokeless tobacco: Never  Vaping Use   Vaping Use: Never used  Substance and Sexual Activity   Alcohol use: Not Currently    Comment: maybe a fifth per week, not all of the time   Drug use: Yes    Frequency: 4.0 times per week    Types: Marijuana    Comment: 3-4 times per week   Sexual activity: Not on file  Other Topics Concern   Not on file  Social History Narrative   Not on file   Social Determinants of Health   Financial Resource Strain: Not on file  Food Insecurity: Not on file  Transportation Needs: Not on file  Physical Activity: Not on file  Stress: Not on file  Social Connections: Not on file   Family History  Problem Relation Age of Onset   Colon cancer Neg Hx    Esophageal cancer Neg Hx    Rectal cancer Neg Hx    Stomach cancer Neg Hx     Allergies  Allergen Reactions   Amlodipine Other (See Comments)    Unknown reaction    Codeine Nausea And Vomiting   Lisinopril Cough   Prior to Admission medications   Medication Sig Start Date End Date Taking? Authorizing Provider  atenolol (TENORMIN) 50 MG tablet Take 50 mg by mouth daily.    Yes [provider]  Cholecalciferol 50 MCG (2000 UT) TABS Take 2,000 Units by mouth daily.    Yes [provider]  finasteride (PROSCAR) 5 MG tablet Take 5 mg by mouth daily.   Yes [provider]  gabapentin (NEURONTIN) 300 MG capsule Take 900 mg by mouth daily.   Yes [provider]  hydrALAZINE (APRESOLINE) 10 MG tablet Take 10 mg by mouth daily.   Yes [provider]  hydrochlorothiazide (HYDRODIURIL) 25 MG tablet Take 25 mg by mouth daily.    Yes [provider]  triamcinolone cream (KENALOG) 0.1 % Apply 1 Application topically daily as needed (itching, irritation).   Yes [provider]  acetaminophen (TYLENOL) 500 MG tablet Take 1,000 mg by mouth every 6 (six) hours as needed for moderate pain.    [provider]  peg 3350 powder (  MOVIPREP) 100 g SOLR Take 1 kit by mouth.    [provider]  tamsulosin (FLOMAX) 0.4 MG CAPS capsule Take 0.8 mg by mouth daily.    [provider]     All other systems have been reviewed and were otherwise negative with the exception of those mentioned in the HPI and as above.  Physical Exam: Vitals:   04/13/22 0603  BP: 140/70  Pulse: 74  Resp: 18  Temp: 97.8 F (36.6 C)  SpO2: 96%    Body mass index is 40.45 kg/m.  General: Alert, no acute distress Cardiovascular: No pedal edema Respiratory: No cyanosis, no use of accessory musculature Skin: No lesions in the area of chief complaint Neurologic: Sensation intact distally Psychiatric: Patient is competent for consent with normal mood and affect Lymphatic: No axillary or cervical  lymphadenopathy   Assessment/Plan: Bilateral lower extremity pain and weakness, likely due to severe L4-L5 spinal stenosis, the level below his fusion Plan for Procedure(s): LEFT-SIDED LUMBAR 4 - LUMBAR 5 TRANSFORAMINAL LUMBAR INTERBODY FUSION AND DECOMPRESSION WITH INSTRUMENTATION ALLOGRAFT APPLICATION   Norva Karvonen, MD 04/13/2022 6:26 AM

## 2022-04-14 MED ORDER — METHOCARBAMOL 500 MG PO TABS: 500.0000 mg | ORAL_TABLET | Freq: Four times a day (QID) | ORAL | 2 refills | Status: DC | PRN

## 2022-04-14 MED ORDER — OXYCODONE-ACETAMINOPHEN 5-325 MG PO TABS
1.0000 | ORAL_TABLET | ORAL | 0 refills | Status: DC | PRN
Start: 2022-04-14 — End: 2022-05-26

## 2022-04-14 MED FILL — Thrombin For Soln Kit 20000 Unit: CUTANEOUS | Qty: 1 | Status: AC

## 2022-04-14 NOTE — Progress Notes (Signed)
Report given to receiving RN. Patient and family aware of transfer to another unit d/t the fact that SNF will be accepting patient until paper work is process and patient accepted for rehab.

## 2022-04-14 NOTE — NC FL2 (Signed)
[place] MEDICAID FL2 LEVEL OF CARE SCREENING TOOL     IDENTIFICATION  Patient Name: Evan Morgan Birthdate: [birth year] Sex: male Admission Date (Current Location): 04/13/2022  [facility] and [place] Number:  Producer, television/film/video and Address:  [facility], [address], [place], [place] [address]      Provider Number: [id number]  Attending Physician Name and Address:  Estill Bamberg, MD  Relative Name and Phone Number:       Current Level of Care: Hospital Recommended Level of Care: Skilled Nursing Facility Prior Approval Number:    Date Approved/Denied:   PASRR Number: [id number]  Discharge Plan: SNF    Current Diagnoses: Patient Active Problem List   Diagnosis Date Noted   Radiculopathy 07/23/2019   Abnormal gait 09/03/2017   Bilateral leg numbness 09/03/2017   Muscle spasm 11/07/2016   BPH (benign prostatic hyperplasia) 08/30/2016   GERD (gastroesophageal reflux disease) 08/30/2016   HTN (hypertension) 08/30/2016   S/P lumbar fusion 08/27/2015   DDD (degenerative disc disease), lumbar 04/01/2015   Foraminal stenosis of lumbar region 04/01/2015   Spinal fusion failure 04/01/2015   Midline low back pain with sciatica 12/16/2014    Orientation RESPIRATION BLADDER Height & Weight     Self, Time, Situation, Place  Normal Continent Weight: 131.5 kg Height:  5\' 11"  (180.3 cm)  BEHAVIORAL SYMPTOMS/MOOD NEUROLOGICAL BOWEL NUTRITION STATUS      Continent Diet (regular with thin liquids)  AMBULATORY STATUS COMMUNICATION OF NEEDS Skin   Extensive Assist Verbally Surgical wounds (dressing to back)                       Personal Care Assistance Level of Assistance  Bathing, Feeding, Dressing Bathing Assistance: Maximum assistance Feeding assistance: Independent Dressing Assistance: Maximum assistance     Functional Limitations Info  Sight, Hearing, Speech Sight Info: Adequate Hearing Info: Adequate Speech Info: Adequate     SPECIAL CARE FACTORS FREQUENCY  PT (By licensed PT), OT (By licensed OT)     PT Frequency: 5x/wk OT Frequency: 5x/wk            Contractures Contractures Info: Not present    Additional Factors Info  Code Status, Allergies, Psychotropic Code Status Info: Full Allergies Info: Amlodipine/ codeine/ lisinopril Psychotropic Info: Neurontin 900 mg at bedtime         Current Medications (04/14/2022):  This is the current hospital active medication list Current Facility-Administered Medications  Medication Dose Route Frequency Provider Last Rate Last Admin   0.9 %  sodium chloride infusion  250 mL Intravenous Continuous McKenzie, Kayla J, PA-C       0.9 % NaCl with KCl 20 mEq/ L  infusion   Intravenous Continuous McKenzie, Kayla J, PA-C   Stopped at 04/13/22 2131   acetaminophen (TYLENOL) tablet 650 mg  650 mg Oral Q4H PRN McKenzie, Kayla J, PA-C       Or   acetaminophen (TYLENOL) suppository 650 mg  650 mg Rectal Q4H PRN McKenzie, Kayla J, PA-C       alum & mag hydroxide-simeth (MAALOX/MYLANTA) 200-200-20 MG/5ML suspension 30 mL  30 mL Oral Q6H PRN McKenzie, Kayla J, PA-C   30 mL at 04/14/22 1349   atenolol (TENORMIN) tablet 50 mg  50 mg Oral Daily McKenzie, Kayla J, PA-C   50 mg at 04/14/22 0829   bisacodyl (DULCOLAX) EC tablet 5 mg  5 mg Oral Daily PRN 04/16/22, PA-C  cholecalciferol (VITAMIN D3) tablet 2,000 Units  2,000 Units Oral Daily Georga Bora, PA-C   2,000 Units at 04/14/22 1308   docusate sodium (COLACE) capsule 100 mg  100 mg Oral BID Georga Bora, PA-C   100 mg at 04/14/22 6578   finasteride (PROSCAR) tablet 5 mg  5 mg Oral Daily McKenzie, Eilene Ghazi, PA-C   5 mg at 04/14/22 4696   gabapentin (NEURONTIN) capsule 900 mg  900 mg Oral QHS McKenzieEilene Ghazi, PA-C   900 mg at 04/13/22 2130   hydrALAZINE (APRESOLINE) tablet 10 mg  10 mg Oral Daily McKenzieEilene Ghazi, PA-C   10 mg at 04/14/22 2952   hydrochlorothiazide (HYDRODIURIL) tablet 25 mg  25 mg  Oral Daily McKenzieEilene Ghazi, PA-C   25 mg at 04/14/22 8413   HYDROcodone-acetaminophen (NORCO/VICODIN) 5-325 MG per tablet 1 tablet  1 tablet Oral Q4H PRN McKenzie, Kayla J, PA-C       menthol-cetylpyridinium (CEPACOL) lozenge 3 mg  1 lozenge Oral PRN McKenzie, Kayla J, PA-C       Or   phenol (CHLORASEPTIC) mouth spray 1 spray  1 spray Mouth/Throat PRN McKenzie, Kayla J, PA-C       methocarbamol (ROBAXIN) tablet 500 mg  500 mg Oral Q6H PRN McKenzie, Kayla J, PA-C   500 mg at 04/14/22 1018   Or   methocarbamol (ROBAXIN) 500 mg in dextrose 5 % 50 mL IVPB  500 mg Intravenous Q6H PRN McKenzie, Kayla J, PA-C       morphine (PF) 2 MG/ML injection 1-2 mg  1-2 mg Intravenous Q2H PRN McKenzie, Kayla J, PA-C       ondansetron (ZOFRAN) tablet 4 mg  4 mg Oral Q6H PRN McKenzie, Kayla J, PA-C       Or   ondansetron (ZOFRAN) injection 4 mg  4 mg Intravenous Q6H PRN McKenzie, Kayla J, PA-C   4 mg at 04/14/22 0153   oxyCODONE-acetaminophen (PERCOCET/ROXICET) 5-325 MG per tablet 1-2 tablet  1-2 tablet Oral Q4H PRN McKenzie, Eilene Ghazi, PA-C   2 tablet at 04/14/22 1018   senna-docusate (Senokot-S) tablet 1 tablet  1 tablet Oral QHS PRN McKenzie, Kayla J, PA-C       sodium chloride flush (NS) 0.9 % injection 3 mL  3 mL Intravenous Q12H McKenzie, Kayla J, PA-C   3 mL at 04/14/22 2440   sodium chloride flush (NS) 0.9 % injection 3 mL  3 mL Intravenous PRN McKenzie, Kayla J, PA-C       sodium phosphate (FLEET) 7-19 GM/118ML enema 1 enema  1 enema Rectal Once PRN McKenzie, Kayla J, PA-C       zolpidem (AMBIEN) tablet 5 mg  5 mg Oral QHS PRN Georga Bora, PA-C         Discharge Medications: Please see discharge summary for a list of discharge medications.  Relevant Imaging Results:  Relevant Lab Results:   Additional Information SS# [id number]  Kermit Balo, RN

## 2022-04-14 NOTE — Progress Notes (Signed)
  Inpatient Rehabilitation Admissions Coordinator   Per therapy recommendations patient was screened for CIR candidacy by Ottie Glazier RN MSN. Current payor trends with [facility] Medicare are unlikley to approve a CIR/AIR level rehab for this diagnosis. I will not place a Rehab Conuslt at this time. Recommend other rehab venues to be pursued.   Ottie Glazier, RN, MSN Rehab Admissions Coordinator [phone] 04/14/2022 12:38 PM

## 2022-04-14 NOTE — Progress Notes (Signed)
    Patient doing well  Pain is minimal   Physical Exam: Vitals:   04/14/22 0745 04/14/22 1153  BP: (!) 134/92 107/77  Pulse: 69 68  Resp: 18 17  Temp: 98.3 F (36.8 C) 98.3 F (36.8 C)  SpO2: 99% 100%    Dressing in place Baseline weakness unchanged  POD #1 s/p fusion at L4/5, doing well  - up with PT/OT, encourage ambulation - Percocet for pain, Robaxin for muscle spasms - patient will need SNF postoperatively, as expected preoperatively - patient may transfer to SNF once a bed is available

## 2022-04-14 NOTE — Evaluation (Signed)
Physical Therapy Evaluation  Patient Details Name: Evan Morgan MRN: [id number] DOB: [birth year] Today's Date: 04/14/2022  History of Present Illness  Pt is a 71 year old man admitted on 04/13/22 with severe L 4/5 spinal stenosis. Underwent removal of hardware, decompression L4-5 TLIF, R posterolateral fusion on 04/13/2022. PMH significant for HEP C, HTN, back surgeries x 3.   Clinical Impression  Pt admitted with above diagnosis. PTA pt with frequent falls and required use of the hoyer lift to get off the floor. Pt is motivated to progress functionally, and feel he has potential to progress to a min assist level or higher with intensive therapies. Mobility limited this session due to room size, need for chair follow, and drainage requiring linen change, but feel we will be able to progress ambulation next session. Pt was educated on precautions, brace application/wearing schedule, appropriate activity progression, and car transfer. Pt currently with functional limitations due to the deficits listed below (see PT Problem List). Acutely pt will benefit from skilled PT to increase their independence and safety with mobility to allow discharge to the venue listed below.         Recommendations for follow up therapy are one component of a multi-disciplinary discharge planning process, led by the attending physician.  Recommendations may be updated based on patient status, additional functional criteria and insurance authorization.  Follow Up Recommendations Acute inpatient rehab (3hours/day)      Assistance Recommended at Discharge Frequent or constant Supervision/Assistance  Patient can return home with the following  Two people to help with walking and/or transfers;Two people to help with bathing/dressing/bathroom;Assistance with cooking/housework;Direct supervision/assist for medications management;Assist for transportation;Help with stairs or ramp for entrance    Equipment Recommendations None  recommended by PT  Recommendations for Other Services  Rehab consult    Functional Status Assessment Patient has had a recent decline in their functional status and demonstrates the ability to make significant improvements in function in a reasonable and predictable amount of time.     Precautions / Restrictions Precautions Precautions: Fall;Back Precaution Booklet Issued: No Precaution Comments: educated in back precautions and pt was cued for precautions throughout functional mobility. Required Braces or Orthoses: Spinal Brace Spinal Brace: Thoracolumbosacral orthotic;Applied in sitting position Restrictions Weight Bearing Restrictions: No      Mobility  Bed Mobility Overal bed mobility: Needs Assistance Bed Mobility: Rolling, Sidelying to Sit, Sit to Sidelying Rolling: +2 for physical assistance, Mod assist Sidelying to sit: +2 for physical assistance, Mod assist     Sit to sidelying: +2 for physical assistance, Mod assist General bed mobility comments: multimodal cues for technique, use of bed rail, assist for LEs in and out of bed, min assist to raise trunk and guide back to sidelying    Transfers Overall transfer level: Needs assistance Equipment used: Rolling walker (2 wheels) Transfers: Sit to/from Stand Sit to Stand: +2 physical assistance, Min assist, From elevated surface           General transfer comment: assist to rise and steady, allowed hands on walker for stability.    Ambulation/Gait Ambulation/Gait assistance: Min assist, +2 physical assistance, +2 safety/equipment Gait Distance (Feet): 3 Feet Assistive device: Rolling walker (2 wheels) Gait Pattern/deviations: Step-through pattern, Decreased stride length, Trunk flexed, Wide base of support Gait velocity: Decreased Gait velocity interpretation: <1.31 ft/sec, indicative of household ambulator   General Gait Details: Multimodal cues for upright posture. Assist required for balance support and  walker management.  Stairs  Wheelchair Mobility    Modified Rankin (Stroke Patients Only)       Balance Overall balance assessment: Needs assistance Sitting-balance support: Feet unsupported, Bilateral upper extremity supported Sitting balance-Leahy Scale: Poor Sitting balance - Comments: Posterior lean until pt was able to get feet on the floor.   Standing balance support: Reliant on assistive device for balance, Bilateral upper extremity supported Standing balance-Leahy Scale: Poor                               Pertinent Vitals/Pain Pain Assessment Pain Assessment: Faces Faces Pain Scale: Hurts little more Pain Location: back Pain Descriptors / Indicators: Operative site guarding, Aching Pain Intervention(s): Limited activity within patient's tolerance, Monitored during session, Repositioned    Home Living Family/patient expects to be discharged to:: Private residence Living Arrangements: Spouse/significant other Available Help at Discharge: Family;Personal care attendant;Available 24 hours/day (PCA 36 hours a week, M-F) Type of Home: House Home Access: Ramped entrance;Other (comment) (stair elevated)       Home Layout: One level Home Equipment: Rolling Walker (2 wheels);Grab bars - tub/shower;Hand held shower head;Wheelchair - power;BSC/3in1;Other (comment) (hoyer lift, bed rail)      Prior Function Prior Level of Function : Needs assist             Mobility Comments: transfer to motorized w/c without AD, uses motorized w/c as means of mobility, many falls, uses hoyer lift to get off floor ADLs Comments: assisted for shower transfer, LB bathing and dressing, and toileting     Hand Dominance   Dominant Hand: Right    Extremity/Trunk Assessment   Upper Extremity Assessment Upper Extremity Assessment: Overall WFL for tasks assessed    Lower Extremity Assessment Lower Extremity Assessment: Generalized weakness;RLE  deficits/detail RLE Deficits / Details: Bilateally, pt with decreased strength and AROM. Pt reports decreased sensation bilaterally, however with eyes closed, could not discern whether therapist was touching R or LLE. OT touching RLE and pt points to LLE.    Cervical / Trunk Assessment Cervical / Trunk Assessment: Back Surgery;Kyphotic  Communication   Communication: No difficulties  Cognition Arousal/Alertness: Awake/alert Behavior During Therapy: WFL for tasks assessed/performed Overall Cognitive Status: Within Functional Limits for tasks assessed                                 General Comments: needs multiple modal cues for back precautions        General Comments General comments (skin integrity, edema, etc.): Noted large amoutn of sanginous drainage on sheet/pad/pillowcase upon sitting up EOB. RN notified and was present to change dressing during session.    Exercises     Assessment/Plan    PT Assessment Patient needs continued PT services  PT Problem List Decreased strength;Decreased range of motion;Decreased activity tolerance;Decreased balance;Decreased mobility;Decreased knowledge of use of DME;Decreased safety awareness;Decreased knowledge of precautions;Pain;Impaired sensation       PT Treatment Interventions DME instruction;Gait training;Functional mobility training;Therapeutic activities;Therapeutic exercise;Patient/family education;Balance training;Wheelchair mobility training    PT Goals (Current goals can be found in the Care Plan section)  Acute Rehab PT Goals Patient Stated Goal: Be able to walk to the door of the room PT Goal Formulation: With patient/family Time For Goal Achievement: 04/28/22 Potential to Achieve Goals: Good    Frequency Min 5X/week     Co-evaluation PT/OT/SLP Co-Evaluation/Treatment: Yes Reason for Co-Treatment: For patient/therapist safety;To address functional/ADL transfers  PT goals addressed during session:  Mobility/safety with mobility;Balance;Proper use of DME;Strengthening/ROM OT goals addressed during session: ADL's and self-care       AM-PAC PT "6 Clicks" Mobility  Outcome Measure Help needed turning from your back to your side while in a flat bed without using bedrails?: A Lot Help needed moving from lying on your back to sitting on the side of a flat bed without using bedrails?: A Lot Help needed moving to and from a bed to a chair (including a wheelchair)?: Total Help needed standing up from a chair using your arms (e.g., wheelchair or bedside chair)?: Total Help needed to walk in hospital room?: Total Help needed climbing 3-5 steps with a railing? : Total 6 Click Score: 8    End of Session Equipment Utilized During Treatment: Gait belt;Back brace Activity Tolerance: Patient tolerated treatment well Patient left: in bed;with call bell/phone within reach;with family/visitor present Nurse Communication: Mobility status (RN present throughout most of session) PT Visit Diagnosis: Unsteadiness on feet (R26.81);Pain;Difficulty in walking, not elsewhere classified (R26.2) Pain - part of body:  (back)    Time: 0923-1000 PT Time Calculation (min) (ACUTE ONLY): 37 min   Charges:   PT Evaluation $PT Eval Moderate Complexity: 1 Mod          Conni Slipper, PT, DPT Acute Rehabilitation Services Secure Chat Preferred Office: [phone]   Marylynn Pearson 04/14/2022, 12:11 PM

## 2022-04-14 NOTE — TOC Initial Note (Signed)
Transition of Care [facility]) - Initial/Assessment Note    Patient Details  Name: Evan Morgan MRN: [id number] Date of Birth: 1951-02-21  [facility]) CM/SW Contact:    Kermit Balo, RN Phone Number: 04/14/2022, 2:23 PM  Clinical Narrative:                 Pt is s/p HARDWARE REMOVAL  LEFT-SIDED LUMBAR 4 - LUMBAR 5 TRANSFORAMINAL LUMBAR INTERBODY FUSION AND DECOMPRESSION WITH INSTRUMENTATION (Left) ALLOGRAFT APPLICATION.  Therapy recommendations are for CIR but CIR doesn't feel he will be approved through his insurance. Pt is willing to go to SNF rehab. He asked to be faxed out in the [facility] area. Pt will need offers when available and insurance approval.  TOC following.  Expected Discharge Plan: Skilled Nursing Facility Barriers to Discharge: Continued Medical Work up   Patient Goals and CMS Choice   CMS Medicare.gov Compare Post Acute Care list provided to:: Patient Choice offered to / list presented to : Patient  Expected Discharge Plan and Services Expected Discharge Plan: Skilled Nursing Facility In-house Referral: Clinical Social Work Discharge Planning Services: CM Consult Post Acute Care Choice: Skilled Nursing Facility Living arrangements for the past 2 months: Single Family Home                                      Prior Living Arrangements/Services Living arrangements for the past 2 months: Single Family Home Lives with:: Significant Other Patient language and need for interpreter reviewed:: Yes Do you feel safe going back to the place where you live?: Yes      Need for Family Participation in Patient Care: Yes (Comment) Care giver support system in place?: Yes (comment)   Criminal Activity/Legal Involvement Pertinent to Current Situation/Hospitalization: No - Comment as needed  Activities of Daily Living      Permission Sought/Granted                  Emotional Assessment Appearance:: Appears stated  age Attitude/Demeanor/Rapport: Engaged Affect (typically observed): Accepting Orientation: : Oriented to Self, Oriented to Place, Oriented to  Time, Oriented to Situation   Psych Involvement: No (comment)  Admission diagnosis:  Radiculopathy [M54.10] Patient Active Problem List   Diagnosis Date Noted   Radiculopathy 07/23/2019   Abnormal gait 09/03/2017   Bilateral leg numbness 09/03/2017   Muscle spasm 11/07/2016   BPH (benign prostatic hyperplasia) 08/30/2016   GERD (gastroesophageal reflux disease) 08/30/2016   HTN (hypertension) 08/30/2016   S/P lumbar fusion 08/27/2015   DDD (degenerative disc disease), lumbar 04/01/2015   Foraminal stenosis of lumbar region 04/01/2015   Spinal fusion failure 04/01/2015   Midline low back pain with sciatica 12/16/2014   PCP:  Sherwood Gambler, MD Pharmacy:   [facility] - [place], [place] - [address] [place] [address] Phone: [phone] Fax: [phone]  [id number] [place], [place] - [address] AT [facility] [address] & [address] [place] [address] Phone: [phone] Fax: [phone]     Social Determinants of Health (SDOH) Interventions    Readmission Risk Interventions     No data to display

## 2022-04-14 NOTE — Evaluation (Signed)
Occupational Therapy Evaluation Patient Details Name: Evan Morgan MRN: [id number] DOB: July 05, 1951 Today's Date: 04/14/2022   History of Present Illness Pt is a 71 year old man admitted on 04/13/22 with severe L 4/5 spinal stenosis. Underwent removal of hardware, decompression L4-5 TLIF, R posterolateral fusion. PMH: arthritis in knees, HEP C, HTN, back surgeries x 3.   Clinical Impression   Pt lives with his fiance and has and aide who assists him weekdays. He transfers independently and uses a motorized w/c for mobility. Pt with increased falls at home. Pt requires assist for LB bathing ADLs, toileting and shower transfers, but has potential to function at a modified independent level using compensatory strategies and AE with intensive rehab and reduce burden of care at home.  Pt is highly motivated to ambulate safely with RW. Recommending AIR.     Recommendations for follow up therapy are one component of a multi-disciplinary discharge planning process, led by the attending physician.  Recommendations may be updated based on patient status, additional functional criteria and insurance authorization.   Follow Up Recommendations  Acute inpatient rehab (3hours/day)    Assistance Recommended at Discharge Frequent or constant Supervision/Assistance  Patient can return home with the following Two people to help with walking and/or transfers;A lot of help with bathing/dressing/bathroom;Assistance with cooking/housework;Direct supervision/assist for financial management;Assist for transportation;Help with stairs or ramp for entrance    Functional Status Assessment  Patient has had a recent decline in their functional status and demonstrates the ability to make significant improvements in function in a reasonable and predictable amount of time.  Equipment Recommendations  None recommended by OT    Recommendations for Other Services       Precautions / Restrictions Precautions Precautions:  Fall;Back Precaution Booklet Issued: No Precaution Comments: educated in back precautions Required Braces or Orthoses: Spinal Brace Spinal Brace: Thoracolumbosacral orthotic;Applied in sitting position Restrictions Weight Bearing Restrictions: No      Mobility Bed Mobility Overal bed mobility: Needs Assistance Bed Mobility: Rolling, Sidelying to Sit, Sit to Sidelying Rolling: +2 for physical assistance, Mod assist Sidelying to sit: +2 for physical assistance, Mod assist     Sit to sidelying: +2 for physical assistance, Mod assist General bed mobility comments: multimodal cues for technique, use of bed rail, assist for LEs in and out of bed, min assist to raise trunk and guide back to sidelying    Transfers Overall transfer level: Needs assistance Equipment used: Rolling walker (2 wheels) Transfers: Sit to/from Stand Sit to Stand: +2 physical assistance, Min assist, From elevated surface           General transfer comment: assist to rise and steady, allowed hands on walker      Balance Overall balance assessment: Needs assistance   Sitting balance-Leahy Scale: Fair       Standing balance-Leahy Scale: Poor                             ADL either performed or assessed with clinical judgement   ADL Overall ADL's : Needs assistance/impaired Eating/Feeding: Independent;Bed level   Grooming: Supervision/safety;Sitting   Upper Body Bathing: Moderate assistance;Sitting   Lower Body Bathing: Total assistance;+2 for physical assistance;Sit to/from stand   Upper Body Dressing : Minimal assistance;Sitting Upper Body Dressing Details (indicate cue type and reason): max for back brace Lower Body Dressing: Total assistance;+2 for physical assistance;Sit to/from stand       Toileting- Architect and Hygiene: +2  for physical assistance;Total assistance;Sit to/from stand       Functional mobility during ADLs: +2 for physical assistance;Minimal  assistance;Rolling walker (2 wheels) (side steps along EOB)       Vision Ability to See in Adequate Light: 0 Adequate       Perception     Praxis      Pertinent Vitals/Pain Pain Assessment Pain Assessment: 0-10 Pain Score: 8  Pain Location: back Pain Descriptors / Indicators: Operative site guarding, Aching Pain Intervention(s): Monitored during session, Premedicated before session, Repositioned     Hand Dominance Right   Extremity/Trunk Assessment Upper Extremity Assessment Upper Extremity Assessment: Overall WFL for tasks assessed   Lower Extremity Assessment Lower Extremity Assessment: Defer to PT evaluation   Cervical / Trunk Assessment Cervical / Trunk Assessment: Back Surgery;Kyphotic   Communication Communication Communication: No difficulties   Cognition Arousal/Alertness: Awake/alert Behavior During Therapy: WFL for tasks assessed/performed Overall Cognitive Status: Within Functional Limits for tasks assessed                                 General Comments: needs multiple modal cues for back precautions     General Comments       Exercises     Shoulder Instructions      Home Living Family/patient expects to be discharged to:: Private residence Living Arrangements: Spouse/significant other Available Help at Discharge: Family;Personal care attendant;Available 24 hours/day (PCS 36 hours a week, weekdays) Type of Home: House Home Access: Ramped entrance;Other (comment) (stair elevated)     Home Layout: One level     Bathroom Shower/Tub: Producer, television/film/video: Standard     Home Equipment: Agricultural consultant (2 wheels);Grab bars - tub/shower;Hand held shower head;Wheelchair - power;BSC/3in1;Other (comment) (hoyer lift, bed rail)          Prior Functioning/Environment Prior Level of Function : Needs assist             Mobility Comments: transfer to motorized w/c without AD, uses motorized w/c as means of mobility,  many falls, uses hoyer lift to get off floor ADLs Comments: assisted for shower transfer, LB bathing and dressing, and toileting        OT Problem List: Decreased strength;Impaired balance (sitting and/or standing);Decreased knowledge of use of DME or AE;Pain;Obesity      OT Treatment/Interventions: Self-care/ADL training;DME and/or AE instruction;Therapeutic activities;Patient/family education;Balance training    OT Goals(Current goals can be found in the care plan section) Acute Rehab OT Goals OT Goal Formulation: With patient Time For Goal Achievement: 04/28/22 Potential to Achieve Goals: Good ADL Goals Pt Will Perform Lower Body Bathing: with min assist;sitting/lateral leans;with adaptive equipment Pt Will Perform Lower Body Dressing: with min assist;sitting/lateral leans;with adaptive equipment Pt Will Transfer to Toilet: with min assist;ambulating;bedside commode Pt Will Perform Toileting - Clothing Manipulation and hygiene: with min assist;sitting/lateral leans;with adaptive equipment Additional ADL Goal #1: Pt will perform bed mobility using log roll technique with supervision and rail. Additional ADL Goal #2: Pt will generalize back precautions in ADLs and mobility.  OT Frequency: Min 2X/week    Co-evaluation PT/OT/SLP Co-Evaluation/Treatment: Yes Reason for Co-Treatment: For patient/therapist safety   OT goals addressed during session: ADL's and self-care      AM-PAC OT "6 Clicks" Daily Activity     Outcome Measure Help from another person eating meals?: None Help from another person taking care of personal grooming?: A Little Help from another person  toileting, which includes using toliet, bedpan, or urinal?: Total Help from another person bathing (including washing, rinsing, drying)?: A Lot Help from another person to put on and taking off regular upper body clothing?: A Little Help from another person to put on and taking off regular lower body clothing?: Total 6  Click Score: 14   End of Session Equipment Utilized During Treatment: Gait belt;Rolling walker (2 wheels);Back brace Nurse Communication: Mobility status (aware of drainage from incision, RN reinforced dressing)  Activity Tolerance: Patient tolerated treatment well Patient left: in bed;with call bell/phone within reach;with family/visitor present  OT Visit Diagnosis: Unsteadiness on feet (R26.81);Other abnormalities of gait and mobility (R26.89);Pain;Muscle weakness (generalized) (M62.81)                Time: 2119-4174 OT Time Calculation (min): 38 min Charges:  OT General Charges $OT Visit: 1 Visit OT Evaluation $OT Eval Moderate Complexity: 1 Mod  Berna Spare, OTR/L Acute Rehabilitation Services Office: [phone]  Evern Bio 04/14/2022, 10:26 AM

## 2022-04-15 NOTE — Progress Notes (Signed)
    Patient doing well, has condom cath 2/2 increased UOP, +flatus Had mild temp elevation in early morning hours, but room temp noted to be set at 85 Pain is minimal   Physical Exam: Vitals:   04/15/22 0028 04/15/22 0449  BP: 90/61 94/62  Pulse: 77 78  Resp: 20   Temp: (!) 101.4 F (38.6 C) (!) 100.8 F (38.2 C)  SpO2: 96% 96%   Abd soft, non-distended Dressing in place Intact LT P/D/1web  POD #1 s/p fusion at L4/5, doing well  - up with PT/OT, encourage ambulation - will monitor temp elevation and encourage mobility/breathing - Percocet for pain, Robaxin for muscle spasms - patient will need SNF postoperatively, as expected preoperatively - patient may transfer to SNF once a bed is available  Neil Crouch, MD

## 2022-04-15 NOTE — Progress Notes (Signed)
Physical Therapy Treatment Patient Details Name: Evan Morgan MRN: [id number] DOB: [birth year] Today's Date: 04/15/2022   History of Present Illness Pt is a 71 year old man admitted on 04/13/22 with severe L 4/5 spinal stenosis. Underwent removal of hardware, decompression L4-5 TLIF, R posterolateral fusion on 04/13/2022. PMH significant for HEP C, HTN, back surgeries x 3.    PT Comments    Pt with slow progression towards goals. Attempted 4-5 times to stand, however, unable to stand with total A +2 this session. Very minimal clearance of hips noted. Required mod to max A +2 for bed mobility tasks. Per notes, [facility] unable to accept pt, so updated recommendations to SNF. Will continue to follow acutely.   Recommendations for follow up therapy are one component of a multi-disciplinary discharge planning process, led by the attending physician.  Recommendations may be updated based on patient status, additional functional criteria and insurance authorization.  Follow Up Recommendations  Skilled nursing-short term rehab (<3 hours/day) Can patient physically be transported by private vehicle: No   Assistance Recommended at Discharge Frequent or constant Supervision/Assistance  Patient can return home with the following Two people to help with walking and/or transfers;Two people to help with bathing/dressing/bathroom;Assistance with cooking/housework;Direct supervision/assist for medications management;Assist for transportation;Help with stairs or ramp for entrance   Equipment Recommendations  None recommended by PT    Recommendations for Other Services       Precautions / Restrictions Precautions Precautions: Fall;Back Precaution Booklet Issued: No Precaution Comments: Pt able to recall 2/3 back precautions with cues. Required Braces or Orthoses: Spinal Brace Spinal Brace: Thoracolumbosacral orthotic;Applied in sitting position Restrictions Weight Bearing Restrictions: No     Mobility   Bed Mobility Overal bed mobility: Needs Assistance Bed Mobility: Rolling, Sidelying to Sit, Sit to Sidelying Rolling: +2 for physical assistance, Mod assist Sidelying to sit: +2 for physical assistance, Max assist     Sit to sidelying: +2 for physical assistance, Mod assist General bed mobility comments: multimodal cues for technique, use of bed rail, assist for LEs in and out of bed. Heavy trunk assist to come to sitting. Pt with poor balance upon sitting as well. Requiring at least min A up to mod for stability.    Transfers Overall transfer level: Needs assistance Equipment used: Rolling walker (2 wheels), 2 person hand held assist Transfers: Sit to/from Stand Sit to Stand: +2 physical assistance, Total assist, From elevated surface           General transfer comment: Attempted to stand X4-5. Attempted 1st 2 stands with RW and then switched to HHA. Pt requiring total A +2 and unable to power through BLE.    Ambulation/Gait               General Gait Details: unable   Stairs             Wheelchair Mobility    Modified Rankin (Stroke Patients Only)       Balance Overall balance assessment: Needs assistance Sitting-balance support: Bilateral upper extremity supported Sitting balance-Leahy Scale: Poor Sitting balance - Comments: Pt requiring BUE support along with min up to mod A for stability     Standing balance-Leahy Scale: Zero Standing balance comment: Unable to come to standing                            Cognition Arousal/Alertness: Awake/alert Behavior During Therapy: WFL for tasks assessed/performed Overall Cognitive Status: No family/caregiver present  to determine baseline cognitive functioning                                 General Comments: Cues for safety        Exercises      General Comments        Pertinent Vitals/Pain Pain Assessment Pain Assessment: Faces Faces Pain Scale: Hurts even more Pain  Location: back Pain Descriptors / Indicators: Operative site guarding, Aching Pain Intervention(s): Limited activity within patient's tolerance, Monitored during session, Repositioned    Home Living                          Prior Function            PT Goals (current goals can now be found in the care plan section) Acute Rehab PT Goals Patient Stated Goal: Be able to walk to the door of the room PT Goal Formulation: With patient/family Time For Goal Achievement: 04/28/22 Potential to Achieve Goals: Good Progress towards PT goals: Not progressing toward goals - comment    Frequency    Min 5X/week      PT Plan Frequency needs to be updated    Co-evaluation              AM-PAC PT "6 Clicks" Mobility   Outcome Measure  Help needed turning from your back to your side while in a flat bed without using bedrails?: A Lot Help needed moving from lying on your back to sitting on the side of a flat bed without using bedrails?: Total Help needed moving to and from a bed to a chair (including a wheelchair)?: Total Help needed standing up from a chair using your arms (e.g., wheelchair or bedside chair)?: Total Help needed to walk in hospital room?: Total Help needed climbing 3-5 steps with a railing? : Total 6 Click Score: 7    End of Session Equipment Utilized During Treatment: Gait belt;Back brace Activity Tolerance: Patient tolerated treatment well Patient left: in bed;with call bell/phone within reach;with bed alarm set Nurse Communication: Mobility status;Need for lift equipment PT Visit Diagnosis: Unsteadiness on feet (R26.81);Pain;Difficulty in walking, not elsewhere classified (R26.2) Pain - part of body:  (back)     Time: 5035-4656 PT Time Calculation (min) (ACUTE ONLY): 35 min  Charges:  $Therapeutic Activity: 23-37 mins                     Cindee Salt, DPT  Acute Rehabilitation Services  Office: [phone]    [name] 04/15/2022, 3:05 PM

## 2022-04-15 NOTE — TOC Progression Note (Signed)
Transition of Care [facility]) - Progression Note    Patient Details  Name: QUENTION MCNEILL MRN: [id number] Date of Birth: 28-Jun-1951  Transition of [facility]) CM/SW Contact  Carmina Miller, LCSWA Phone Number: 04/15/2022, 11:03 AM  Clinical Narrative:     CSW spoke with pt about pending bed offer, pt sounded as if he was in pain and requested CSW speak with both of his sisters-Dorthy and Alice and deferred decision making to them. CSW tried to reach out to both Dorthy and Alice, Dorthy's vm is not set up and there was no answer for [place] (appears to be a landline phone with no answering machine). CSW will continue to try and reach pt's sisters to assist with dc plans.   Expected Discharge Plan: Skilled Nursing Facility Barriers to Discharge: Continued Medical Work up  Expected Discharge Plan and Services Expected Discharge Plan: Skilled Nursing Facility In-house Referral: Clinical Social Work Discharge Planning Services: CM Consult Post Acute Care Choice: Skilled Nursing Facility Living arrangements for the past 2 months: Single Family Home                                       Social Determinants of Health (SDOH) Interventions    Readmission Risk Interventions     No data to display

## 2022-04-15 NOTE — Progress Notes (Signed)
   04/15/22 0028  Assess: MEWS Score  Temp (!) 101.4 F (38.6 C)  BP 90/61  MAP (mmHg) 70  Pulse Rate 77  SpO2 96 %  O2 Device Room Air  Assess: MEWS Score  MEWS Temp 1  MEWS Systolic 1  MEWS Pulse 0  MEWS RR 0  MEWS LOC 0  MEWS Score 2  MEWS Score Color Yellow  Assess: if the MEWS score is Yellow or Red  Were vital signs taken at a resting state? Yes  Focused Assessment No change from prior assessment  Does the patient meet 2 or more of the SIRS criteria? No  MEWS guidelines implemented *See Row Information* No, other (Comment)  Treat  MEWS Interventions Administered prn meds/treatments  Pain Scale 0-10  Pain Score 0  Escalate  MEWS: Escalate Yellow: discuss with charge nurse/RN and consider discussing with provider and RRT  Notify: Charge Nurse/RN  Name of Charge Nurse/RN Notified Phil  Date Charge Nurse/RN Notified 04/15/22  Time Charge Nurse/RN Notified 0036  Document  Patient Outcome Stabilized after interventions  Progress note created (see row info) Yes  Assess: SIRS CRITERIA  SIRS Temperature  1  SIRS Pulse 0  SIRS Respirations  0  SIRS WBC 0  SIRS Score Sum  1

## 2022-04-16 MED ORDER — BISACODYL 10 MG RE SUPP
10.0000 mg | Freq: Once | RECTAL | Status: AC
  Administered 2022-04-16: 10 mg via RECTAL
  Filled 2022-04-16: qty 1

## 2022-04-16 NOTE — Progress Notes (Signed)
     Evan Morgan is a 71 y.o. male   Subjective: Patient appears comfortable in bed.  Pain slowly improving.  Persist with some weakness in the bilateral lower extremities.  He has only been up once with physical therapy.  Tells me he has not had a bowel movement in the last 5 or 6 days.  He is requesting a suppository as oral meds are not helping.  He has had success with this in the past.  He is passing small amounts of gas.  No abdominal pain.  Oral temperature is normal today.  Otherwise, no new concerns.  Objectyive: Vitals:   04/15/22 2340 04/16/22 0347  BP: (!) 94/54 103/72  Pulse: 66 79  Resp: 16 16  Temp: 98.8 F (37.1 C) 98.5 F (36.9 C)  SpO2: 97% 96%     Exam: Awake and alert Respirations even and unlabored No acute distress  Dressing in place.  Small amount of old appearing dried blood noting.  It is not saturated.  Dressing was left in place.  Abdomen is soft and nondistended.  Endorses intact sensation to light touch distally bilateral lower extremities.  He has 5 out of 5 strength at the ankles.  He can wiggle toes at difficulty.  He is observed actively flexing and extending the knees.  Calf soft nontender.  Exposed skin is benign.  Warm and well-perfused distally in the bilateral lower extremities.   Assessment: Postop day 2 status post fusion at  L4/5, doing well and suitable for discharge wants SNF placement available.   Plan: -We will try a suppository at his request to see if this will help initiate a bowel movement. - up with PT/OT, encourage ambulation - Percocet for pain, Robaxin for muscle spasms - patient will need SNF postoperatively, as expected preoperatively - patient may transfer to SNF once a bed is available   Eria Lozoya J. [place], PA-C

## 2022-04-17 NOTE — Progress Notes (Signed)
    Patient doing well  Continues to have baseline weakness. Pain reasonably managed with his current pain regimen   Physical Exam: Vitals:   04/17/22 0344 04/17/22 0735  BP: 113/79 125/69  Pulse: 69 64  Resp:  16  Temp: 98.8 F (37.1 C) 98.3 F (36.8 C)  SpO2: 99% 97%    Dressing in place NVI  POD s/p lumbar decompression and fusion, awaiting transfer to skilled nursing facility  - up with PT/OT, encourage ambulation - Percocet for pain, Robaxin for muscle spasms - current plan is to transfer to a skilled nursing facility as soon a bed is available

## 2022-04-17 NOTE — Plan of Care (Signed)
  Problem: Education: Goal: Knowledge of General Education information will improve Description: Including pain rating scale, medication(s)/side effects and non-pharmacologic comfort measures Outcome: Progressing   Problem: Health Behavior/Discharge Planning: Goal: Ability to manage health-related needs will improve Outcome: Progressing   Problem: Clinical Measurements: Goal: Ability to maintain clinical measurements within normal limits will improve Outcome: Progressing Goal: Will remain free from infection Outcome: Progressing Goal: Diagnostic test results will improve Outcome: Progressing Goal: Respiratory complications will improve Outcome: Progressing Goal: Cardiovascular complication will be avoided Outcome: Progressing   Problem: Activity: Goal: Risk for activity intolerance will decrease Outcome: Not Progressing   Problem: Nutrition: Goal: Adequate nutrition will be maintained Outcome: Progressing   Problem: Coping: Goal: Level of anxiety will decrease Outcome: Not Progressing   Problem: Elimination: Goal: Will not experience complications related to bowel motility Outcome: Progressing Goal: Will not experience complications related to urinary retention Outcome: Progressing   Problem: Pain Managment: Goal: General experience of comfort will improve Outcome: Progressing   Problem: Safety: Goal: Ability to remain free from injury will improve Outcome: Progressing   Problem: Skin Integrity: Goal: Risk for impaired skin integrity will decrease Outcome: Progressing   Problem: Education: Goal: Ability to verbalize activity precautions or restrictions will improve Outcome: Progressing Goal: Knowledge of the prescribed therapeutic regimen will improve Outcome: Progressing Goal: Understanding of discharge needs will improve Outcome: Progressing   Problem: Activity: Goal: Ability to avoid complications of mobility impairment will improve Outcome:  Progressing Goal: Ability to tolerate increased activity will improve Outcome: Progressing Goal: Will remain free from falls Outcome: Progressing   Problem: Bowel/Gastric: Goal: Gastrointestinal status for postoperative course will improve Outcome: Progressing   Problem: Clinical Measurements: Goal: Ability to maintain clinical measurements within normal limits will improve Outcome: Progressing Goal: Postoperative complications will be avoided or minimized Outcome: Progressing Goal: Diagnostic test results will improve Outcome: Progressing   Problem: Pain Management: Goal: Pain level will decrease Outcome: Progressing   Problem: Skin Integrity: Goal: Will show signs of wound healing Outcome: Progressing   Problem: Health Behavior/Discharge Planning: Goal: Identification of resources available to assist in meeting health care needs will improve Outcome: Progressing   Problem: Bladder/Genitourinary: Goal: Urinary functional status for postoperative course will improve Outcome: Progressing

## 2022-04-17 NOTE — Progress Notes (Signed)
Occupational Therapy Treatment Patient Details Name: Evan Morgan MRN: [id number] DOB: 1951-05-17 Today's Date: 04/17/2022   History of present illness Pt is a 71 year old man admitted on 04/13/22 with severe L 4/5 spinal stenosis. Underwent removal of hardware, decompression L4-5 TLIF, R posterolateral fusion on 04/13/2022. PMH significant for HEP C, HTN, back surgeries x 3.   OT comments  Pt with incremental progress towards OT goals. Pt continues to present with decreased strength, balance, endurance, and knowledge of precautions. Pt performing bed mobility with mod A +2, and requires max A and verbal/visual cues for TLSO brace application. Pt performing sit<>stand transfers using Stedy with max A +2. Challenging strength with BUE chair push-ups this session in preparation for continued independence with ADL and transfers. Pt not a candidate for [facility], therefore, updated recommendation to SNF to optimize independence in ADL and functional mobility. Will continue to follow acutely.    Recommendations for follow up therapy are one component of a multi-disciplinary discharge planning process, led by the attending physician.  Recommendations may be updated based on patient status, additional functional criteria and insurance authorization.    Follow Up Recommendations  Skilled nursing-short term rehab (<3 hours/day)    Assistance Recommended at Discharge Frequent or constant Supervision/Assistance  Patient can return home with the following  Two people to help with walking and/or transfers;Assistance with cooking/housework;Direct supervision/assist for financial management;Assist for transportation;Help with stairs or ramp for entrance;Two people to help with bathing/dressing/bathroom   Equipment Recommendations  None recommended by OT    Recommendations for Other Services      Precautions / Restrictions Precautions Precautions: Fall;Back Precaution Booklet Issued: No Precaution Comments: Pt  able to recall 2/3 back precautions with cues. Required Braces or Orthoses: Spinal Brace Spinal Brace: Thoracolumbosacral orthotic;Applied in sitting position Restrictions Weight Bearing Restrictions: No       Mobility Bed Mobility Overal bed mobility: Needs Assistance Bed Mobility: Rolling, Sidelying to Sit Rolling: Mod assist, +2 for physical assistance Sidelying to sit: Mod assist, +2 for physical assistance, HOB elevated       General bed mobility comments: multimodal cues for technique, use of bed rail, assist for LEs out of bed. Trunk assist to come to sitting. Pt with poor balance upon sitting, initially requiring min A, however, improving with BLE on floor. Pt was observed to maintain frequent UE contact with bed rails while sitting EOB.    Transfers Overall transfer level: Needs assistance Equipment used:  Antony Salmon) Transfers: Sit to/from Stand Sit to Stand: +2 physical assistance, Max assist, From elevated surface           General transfer comment: 2x sit<>stand. Pt requiring max A +2 on first attempt with pulling up to stedy, however, benefitting from third person to assist pt to full upright position due to decreased leverage to pull to standing with BUE when transfering from [facility] to recliner.     Balance Overall balance assessment: Needs assistance Sitting-balance support: Bilateral upper extremity supported, Single extremity supported, No upper extremity supported Sitting balance-Leahy Scale: Poor Sitting balance - Comments: Pt requiring intermittent A for sitting during brace application.   Standing balance support: Reliant on assistive device for balance, Bilateral upper extremity supported Standing balance-Leahy Scale: Zero Standing balance comment: Pt requires max A and max verbal cues to achieve upright standing position.                           ADL either performed or  assessed with clinical judgement   ADL Overall ADL's : Needs  assistance/impaired                     Lower Body Dressing: Total assistance Lower Body Dressing Details (indicate cue type and reason): Total A to adjust socks     Toileting- Clothing Manipulation and Hygiene: +2 for physical assistance;Maximal assistance;Cueing for safety;With adaptive equipment;Sit to/from stand       Functional mobility during ADLs: +2 for physical assistance;Maximal assistance (Sit<>stand using stedy) General ADL Comments: Max A for TLSO brace application.    Extremity/Trunk Assessment Upper Extremity Assessment Upper Extremity Assessment: Generalized weakness (Pt reports his shoulders are "messed up", and noting preference for use of RUE. Pt with generalized weakness and decreased fine motor skills to remove velcro straps tighten TLSO brace.)   Lower Extremity Assessment Lower Extremity Assessment: Defer to PT evaluation        Vision       Perception     Praxis      Cognition Arousal/Alertness: Awake/alert Behavior During Therapy: WFL for tasks assessed/performed Overall Cognitive Status: No family/caregiver present to determine baseline cognitive functioning                                 General Comments: Pt presents with decreased motivation this moring and requesting therapy to return later. Pt receptive to therapy session this afternoon, however, noting relatively flat affect this session. Pt continues to require verbal cues to recall back precautions and for application of back precautions during functional ADLs. Pt requires cues for safety during transfers using stedy.        Exercises Exercises: General Upper Extremity General Exercises - Upper Extremity Chair Push Up: AROM, Both, 5 reps, Seated    Shoulder Instructions       General Comments VSS. Pt asking when therapy will be back at end of session.    Pertinent Vitals/ Pain       Pain Assessment Pain Assessment: Faces Faces Pain Scale: Hurts even  more Pain Location: back Pain Descriptors / Indicators: Operative site guarding, Aching, Discomfort Pain Intervention(s): Limited activity within patient's tolerance, Monitored during session, Repositioned  Home Living                                          Prior Functioning/Environment              Frequency  Min 2X/week        Progress Toward Goals  OT Goals(current goals can now be found in the care plan section)  Progress towards OT goals: Progressing toward goals  Acute Rehab OT Goals OT Goal Formulation: With patient Time For Goal Achievement: 04/28/22 Potential to Achieve Goals: Good ADL Goals Pt Will Perform Lower Body Bathing: with min assist;sitting/lateral leans;with adaptive equipment Pt Will Perform Lower Body Dressing: with min assist;sitting/lateral leans;with adaptive equipment Pt Will Transfer to Toilet: with min assist;ambulating;bedside commode Pt Will Perform Toileting - Clothing Manipulation and hygiene: with min assist;sitting/lateral leans;with adaptive equipment Additional ADL Goal #1: Pt will perform bed mobility using log roll technique with supervision and rail. Additional ADL Goal #2: Pt will generalize back precautions in ADLs and mobility.  Plan Discharge plan needs to be updated    Co-evaluation    PT/OT/SLP Co-Evaluation/Treatment: Yes Reason for  Co-Treatment: For patient/therapist safety;To address functional/ADL transfers   OT goals addressed during session: ADL's and self-care;Strengthening/ROM      AM-PAC OT "6 Clicks" Daily Activity     Outcome Measure   Help from another person eating meals?: A Little Help from another person taking care of personal grooming?: A Little Help from another person toileting, which includes using toliet, bedpan, or urinal?: Total Help from another person bathing (including washing, rinsing, drying)?: A Lot Help from another person to put on and taking off regular upper body  clothing?: A Little Help from another person to put on and taking off regular lower body clothing?: Total 6 Click Score: 13    End of Session Equipment Utilized During Treatment: Gait belt;Other (comment);Back brace Antony Salmon)  OT Visit Diagnosis: Unsteadiness on feet (R26.81);Other abnormalities of gait and mobility (R26.89);Pain;Muscle weakness (generalized) (M62.81) Pain - part of body:  (operative site (back))   Activity Tolerance Patient tolerated treatment well   Patient Left in chair;with call bell/phone within reach;with chair alarm set   Nurse Communication Mobility status        Time: 1345-1416 OT Time Calculation (min): 31 min  Charges: OT General Charges $OT Visit: 1 Visit OT Treatments $Self Care/Home Management : 8-22 mins  Ladene Artist, OTR/L [facility] Acute Rehabilitation Office: [phone]  Drue Novel 04/17/2022, 2:49 PM

## 2022-04-17 NOTE — Care Management Important Message (Signed)
Important Message  Patient Details  Name: Evan Morgan MRN: [id number] Date of Birth: 08/03/1951   Medicare Important Message Given:  Yes     [name] 04/17/2022, 3:16 PM

## 2022-04-17 NOTE — Progress Notes (Signed)
Physical Therapy Treatment Patient Details Name: Evan Morgan MRN: [id number] DOB: 02-27-1951 Today's Date: 04/17/2022   History of Present Illness Pt is a 71 year old man admitted on 04/13/22 with severe L 4/5 spinal stenosis. Underwent removal of hardware, decompression L4-5 TLIF, R posterolateral fusion on 04/13/2022. PMH significant for HEP C, HTN, back surgeries x 3.    PT Comments    Pt progressing slowly towards physical therapy goals. The Antony Salmon was utilized for OOB to chair, with 3rd person helpful to improve posture in standing. Patient fatigues quickly and requires increased time for all aspects of mobility. Continue to recommend SNF level rehab at d/c to maximize functional independence and to decrease caregiver burden upon return home with family support. Will continue to follow.    Recommendations for follow up therapy are one component of a multi-disciplinary discharge planning process, led by the attending physician.  Recommendations may be updated based on patient status, additional functional criteria and insurance authorization.  Follow Up Recommendations  Skilled nursing-short term rehab (<3 hours/day) Can patient physically be transported by private vehicle: No   Assistance Recommended at Discharge Frequent or constant Supervision/Assistance  Patient can return home with the following Two people to help with walking and/or transfers;Two people to help with bathing/dressing/bathroom;Assistance with cooking/housework;Direct supervision/assist for medications management;Assist for transportation;Help with stairs or ramp for entrance   Equipment Recommendations  None recommended by PT    Recommendations for Other Services Rehab consult     Precautions / Restrictions Precautions Precautions: Fall;Back Precaution Booklet Issued: No Precaution Comments: Pt able to recall 2/3 back precautions with cues. Required Braces or Orthoses: Spinal Brace Spinal Brace:  Thoracolumbosacral orthotic;Applied in sitting position Restrictions Weight Bearing Restrictions: No     Mobility  Bed Mobility Overal bed mobility: Needs Assistance Bed Mobility: Rolling, Sidelying to Sit Rolling: Mod assist, +2 for physical assistance Sidelying to sit: Mod assist, +2 for physical assistance, HOB elevated       General bed mobility comments: multimodal cues for technique, use of bed rail, assist for LEs out of bed. Trunk assist to come to sitting. Pt with poor balance upon sitting, initially requiring min A, however, improving with BLE on floor.    Transfers Overall transfer level: Needs assistance Equipment used:  Antony Salmon) Transfers: Sit to/from Stand Sit to Stand: +2 physical assistance, Max assist, From elevated surface           General transfer comment: 2x sit<>stand. Pt requiring max A +2 on first attempt with pulling up to stedy, however, benefitting from third person to assist pt to full upright position due to decreased leverage to pull to standing with BUE when transfering from [facility] to recliner.    Ambulation/Gait               General Gait Details: Unable to progress to gait training this session   Stairs             Wheelchair Mobility    Modified Rankin (Stroke Patients Only)       Balance Overall balance assessment: Needs assistance Sitting-balance support: Bilateral upper extremity supported, Single extremity supported, No upper extremity supported Sitting balance-Leahy Scale: Poor Sitting balance - Comments: Pt requiring intermittent A for sitting during brace application.   Standing balance support: Reliant on assistive device for balance, Bilateral upper extremity supported Standing balance-Leahy Scale: Zero Standing balance comment: Pt requires max A and max verbal cues to achieve upright standing position.  Cognition Arousal/Alertness: Awake/alert Behavior During Therapy:  WFL for tasks assessed/performed Overall Cognitive Status: No family/caregiver present to determine baseline cognitive functioning                                 General Comments: Pt presents with decreased motivation this moring and requesting therapy to return later. Pt receptive to therapy session this afternoon, however, noting relatively flat affect this session. Pt continues to require verbal cues to recall back precautions and for application of back precautions during functional mobility and ADLs. Pt requires cues for safety during transfers using stedy.        Exercises General Exercises - Upper Extremity Chair Push Up: AROM, Both, 5 reps, Seated    General Comments General comments (skin integrity, edema, etc.): VSS. Pt asking when therapy will be back at end of session.      Pertinent Vitals/Pain Pain Assessment Pain Assessment: Faces Faces Pain Scale: Hurts even more Pain Location: back Pain Descriptors / Indicators: Operative site guarding, Aching, Discomfort Pain Intervention(s): Limited activity within patient's tolerance, Monitored during session, Repositioned    Home Living                          Prior Function            PT Goals (current goals can now be found in the care plan section) Acute Rehab PT Goals Patient Stated Goal: Be able to walk to the door of the room PT Goal Formulation: With patient/family Time For Goal Achievement: 04/28/22 Potential to Achieve Goals: Good Progress towards PT goals: Progressing toward goals    Frequency    Min 5X/week      PT Plan Current plan remains appropriate    Co-evaluation PT/OT/SLP Co-Evaluation/Treatment: Yes Reason for Co-Treatment: Complexity of the patient's impairments (multi-system involvement);Necessary to address cognition/behavior during functional activity;For patient/therapist safety;To address functional/ADL transfers PT goals addressed during session:  Mobility/safety with mobility;Balance;Proper use of DME;Strengthening/ROM OT goals addressed during session: ADL's and self-care;Strengthening/ROM      AM-PAC PT "6 Clicks" Mobility   Outcome Measure  Help needed turning from your back to your side while in a flat bed without using bedrails?: A Lot Help needed moving from lying on your back to sitting on the side of a flat bed without using bedrails?: Total Help needed moving to and from a bed to a chair (including a wheelchair)?: Total Help needed standing up from a chair using your arms (e.g., wheelchair or bedside chair)?: Total Help needed to walk in hospital room?: Total Help needed climbing 3-5 steps with a railing? : Total 6 Click Score: 7    End of Session Equipment Utilized During Treatment: Gait belt;Back brace Activity Tolerance: Patient tolerated treatment well Patient left: in bed;with call bell/phone within reach;with bed alarm set Nurse Communication: Mobility status;Need for lift equipment PT Visit Diagnosis: Unsteadiness on feet (R26.81);Pain;Difficulty in walking, not elsewhere classified (R26.2) Pain - part of body:  (back)     Time: 3500-9381 PT Time Calculation (min) (ACUTE ONLY): 31 min  Charges:  $Therapeutic Activity: 8-22 mins                     Conni Slipper, PT, DPT Acute Rehabilitation Services Secure Chat Preferred Office: [phone]    Marylynn Pearson 04/17/2022, 4:02 PM

## 2022-04-17 NOTE — TOC Progression Note (Signed)
Transition of Care [facility]) - Progression Note    Patient Details  Name: Evan Morgan MRN: [id number] Date of Birth: July 18, 1951  [facility]) CM/SW [employer], Frederic Phone Number: 04/17/2022, 4:11 PM  Clinical Narrative:   CSW received from RN updated phone number for sister, Earlie Server. CSW called and spoke with Earlie Server, who added sister Danton Clap to the phone call as well. CSW provided bed offers and sister asked about [facility] instead, as she has heard good things and that would be convenient to go visit. CSW asked [facility] Cowman to review referral and they can offer a bed. CSW met with patient to provide update, and patient asked why he wasn't contacted. CSW explained that weekend CSW attempted to discuss with him and he deferred to his sisters; he didn't seem to remember that, but will go along with what they decided. CSW encouraged patient to work with PT and OT when they came by this afternoon and patient agreed. CSW noting that PT and OT notes were entered on patient and requested insurance authorization. CSW to follow.    Expected Discharge Plan: [place] Barriers to Discharge: Continued Medical Work up  Expected Discharge Plan and Services Expected Discharge Plan: [place] In-house Referral: Clinical Social Work Discharge Planning Services: CM Consult Post Acute Care Choice: [place] arrangements for the past 2 months: Single Family Home Expected Discharge Date: 04/17/22                                     Social Determinants of Health (SDOH) Interventions    Readmission Risk Interventions     No data to display

## 2022-04-18 NOTE — Plan of Care (Signed)

## 2022-04-18 NOTE — Progress Notes (Signed)
    Patient doing well Reports improvements in strength Awaiting transfer   Physical Exam: Vitals:   04/17/22 2023 04/18/22 0407  BP: (!) 111/51 121/79  Pulse: 67 67  Resp: 17 18  Temp: 99.5 F (37.5 C) 99.3 F (37.4 C)  SpO2: 97% 98%   + DF/PF Dressing in place  Patient stable and awaiting transfer to SNF. OK to transfer when bed available.

## 2022-04-18 NOTE — TOC Transition Note (Signed)
Transition of Care [facility]) - CM/SW Discharge Note   Patient Details  Name: ERMIAS TOMEO MRN: [id number] Date of Birth: [birth year]  Transition of Care [facility]) CM/SW Contact:  Baldemar Lenis, LCSW Phone Number: 04/18/2022, 3:29 PM   Clinical Narrative:   CSW received confirmation that patient's insurance authorization was approved, updated [employer] and bed is available today. CSW updated MD, who placed discharge summary. CSW notified patient's sister, Nicole Cella, who will go visit the patient this evening to make sure he's settled. Transport scheduled with [facility] for next available.  Nurse to call report to [phone], Room 407    Final next level of care: Skilled Nursing Facility Barriers to Discharge: Barriers Resolved   Patient Goals and CMS Choice   CMS Medicare.gov Compare Post Acute Care list provided to:: Patient Choice offered to / list presented to : Patient  Discharge Placement              Patient [facility] Patient to be transferred to facility by: [facility] Name of family member notified: Dorothy Patient and family notified of of transfer: 04/18/22  Discharge Plan and Services In-house Referral: Clinical Social Work Discharge Planning Services: CM Consult Post Acute Care Choice: Skilled Nursing Facility                               Social Determinants of Health (SDOH) Interventions     Readmission Risk Interventions     No data to display

## 2022-04-18 NOTE — Discharge Summary (Signed)
Patient ID: Evan Morgan MRN: [id number] DOB/AGE: 1951/05/08 71 y.o.  Admit date: 04/13/2022 Discharge date: 04/18/2022  Admission Diagnoses:  Principal Problem:   Radiculopathy   Discharge Diagnoses:  Same  Past Medical History:  Diagnosis Date   Arthritis    knees   Asthma    As a child   History of hepatitis C    Hypertension     Surgeries: Procedure(s): HARDWARE REMOVAL  LEFT-SIDED LUMBAR 4 - LUMBAR 5 TRANSFORAMINAL LUMBAR INTERBODY FUSION AND DECOMPRESSION WITH INSTRUMENTATION ALLOGRAFT APPLICATION on 5/68/1275   Consultants:   Discharged Condition: Improved  Hospital Course: Evan Morgan is an 71 y.o. male who was admitted 04/13/2022 for operative treatment ofRadiculopathy. Patient has severe unremitting pain that affects sleep, daily activities, and work/hobbies. After pre-op clearance the patient was taken to the operating room on 04/13/2022 and underwent  Procedure(s): HARDWARE REMOVAL  LEFT-SIDED LUMBAR 4 - LUMBAR 5 TRANSFORAMINAL LUMBAR INTERBODY FUSION AND DECOMPRESSION WITH INSTRUMENTATION ALLOGRAFT APPLICATION.    Patient was given perioperative antibiotics:  Anti-infectives (From admission, onward)    Start     Dose/Rate Route Frequency Ordered Stop   04/13/22 1600  ceFAZolin (ANCEF) IVPB 2g/100 mL premix        2 g 200 mL/hr over 30 Minutes Intravenous Every 8 hours 04/13/22 1422 04/14/22 0046   04/13/22 0600  ceFAZolin (ANCEF) IVPB 3g/100 mL premix        3 g 200 mL/hr over 30 Minutes Intravenous On call to O.R. 04/13/22 1700 04/13/22 0804        Patient was given sequential compression devices, early ambulation, and chemoprophylaxis to prevent DVT.  Patient benefited maximally from hospital stay and there were no complications.    Recent vital signs: Patient Vitals for the past 24 hrs:  BP Temp Temp src Pulse Resp SpO2  04/18/22 1154 128/80 98.2 F (36.8 C) -- 60 18 97 %  04/18/22 0812 138/70 98.9 F (37.2 C) Oral 68 18 98 %  04/18/22 0407  121/79 99.3 F (37.4 C) Oral 67 18 98 %  04/17/22 2023 (!) 111/51 99.5 F (37.5 C) Oral 67 17 97 %  04/17/22 1606 106/87 98.4 F (36.9 C) Oral 66 19 100 %     Recent laboratory studies: No results for input(s): "WBC", "HGB", "HCT", "PLT", "NA", "K", "CL", "CO2", "BUN", "CREATININE", "GLUCOSE", "INR", "CALCIUM" in the last 72 hours.  Invalid input(s): "PT", "2"   Discharge Medications:   Allergies as of 04/18/2022       Reactions   Amlodipine Other (See Comments)   Unknown reaction    Codeine Nausea And Vomiting   Lisinopril Cough        Medication List     TAKE these medications    acetaminophen 500 MG tablet Commonly known as: TYLENOL Take 1,000 mg by mouth every 6 (six) hours as needed for moderate pain.   atenolol 50 MG tablet Commonly known as: TENORMIN Take 50 mg by mouth daily.   Cholecalciferol 50 MCG (2000 UT) Tabs Take 2,000 Units by mouth daily.   finasteride 5 MG tablet Commonly known as: PROSCAR Take 5 mg by mouth daily.   gabapentin 300 MG capsule Commonly known as: NEURONTIN Take 900 mg by mouth daily.   hydrALAZINE 10 MG tablet Commonly known as: APRESOLINE Take 10 mg by mouth daily.   hydrochlorothiazide 25 MG tablet Commonly known as: HYDRODIURIL Take 25 mg by mouth daily.   methocarbamol 500 MG tablet Commonly known as: ROBAXIN Take 1  tablet (500 mg total) by mouth every 6 (six) hours as needed for muscle spasms.   oxyCODONE-acetaminophen 5-325 MG tablet Commonly known as: PERCOCET/ROXICET Take 1-2 tablets by mouth every 4 (four) hours as needed for severe pain.   peg 3350 powder 100 g Solr Commonly known as: MOVIPREP Take 1 kit by mouth.   tamsulosin 0.4 MG Caps capsule Commonly known as: FLOMAX Take 0.8 mg by mouth daily.   triamcinolone cream 0.1 % Commonly known as: KENALOG Apply 1 Application topically daily as needed (itching, irritation).        Diagnostic Studies: DG Lumbar Spine 1 View  Result Date:  04/13/2022 CLINICAL DATA:  Intraoperative imaging for L4-L5 TLIF EXAM: LUMBAR SPINE - 1 VIEW COMPARISON:  CT lumbar spine Feb 06, 2022. FINDINGS: Single cross-table lateral intraoperative radiograph was obtained and submitted for postoperative interpretation. L2-L4 posterior spinal fusion hardware with interbody disc spacers without evidence hardware complication. IMPRESSION: Intraoperative radiograph with posterior spinal fusion hardware. Electronically Signed   By: Dahlia Bailiff M.D.   On: 04/13/2022 14:12   DG Lumbar Spine 2-3 Views  Result Date: 04/13/2022 CLINICAL DATA:  Hardware removal L4-L5 EXAM: LUMBAR SPINE - 2 VIEW COMPARISON:  Same day fluoroscopic images FINDINGS: Fluoroscopic images were obtained intraoperatively and submitted for post operative interpretation. Posterior spinal fusion hardware expected position and interbody spacers, 2 images were obtained with 28.1 seconds of fluoroscopy time and 25.2 mGy. Please see the performing provider's procedural report for further detail. IMPRESSION: Fluoroscopic images with posterior spinal fusion hardware. Electronically Signed   By: Yetta Glassman M.D.   On: 04/13/2022 11:44   DG C-Arm 1-60 Min-No Report  Result Date: 04/13/2022 Fluoroscopy was utilized by the requesting physician.  No radiographic interpretation.   DG C-Arm 1-60 Min-No Report  Result Date: 04/13/2022 Fluoroscopy was utilized by the requesting physician.  No radiographic interpretation.   DG C-Arm 1-60 Min-No Report  Result Date: 04/13/2022 Fluoroscopy was utilized by the requesting physician.  No radiographic interpretation.    Disposition: Discharge disposition: 03-Skilled Nursing Facility            Signed: Norva Karvonen 04/18/2022, 3:01 PM

## 2022-04-18 NOTE — Progress Notes (Signed)
Physical Therapy Treatment Patient Details Name: Evan Morgan MRN: [id number] DOB: Feb 27, 1951 Today's Date: 04/18/2022   History of Present Illness Pt is a 71 year old man admitted on 04/13/22 with severe L 4/5 spinal stenosis. Underwent removal of hardware, decompression L4-5 TLIF, R posterolateral fusion on 04/13/2022. PMH significant for HEP C, HTN, back surgeries x 3.    PT Comments    Pt progressing slowly towards physical therapy goals. He was unable to achieve full standing position this session and was not safe to progress to transfer training. Pt reports feeling weak in his LE's, however overall strength does not appear to be decreasing compared to previous sessions. Pt anticipates d/c today to SNF for continued rehab. Continue to feel this is the most appropriate discharge disposition at this time. Will continue to follow.    Recommendations for follow up therapy are one component of a multi-disciplinary discharge planning process, led by the attending physician.  Recommendations may be updated based on patient status, additional functional criteria and insurance authorization.  Follow Up Recommendations  Skilled nursing-short term rehab (<3 hours/day) Can patient physically be transported by private vehicle: No   Assistance Recommended at Discharge Frequent or constant Supervision/Assistance  Patient can return home with the following Two people to help with walking and/or transfers;Two people to help with bathing/dressing/bathroom;Assistance with cooking/housework;Direct supervision/assist for medications management;Assist for transportation;Help with stairs or ramp for entrance   Equipment Recommendations  None recommended by PT    Recommendations for Other Services Rehab consult     Precautions / Restrictions Precautions Precautions: Fall;Back Precaution Booklet Issued: No Precaution Comments: Pt was cued for precautions throughout functional mobility. Required Braces or  Orthoses: Spinal Brace Spinal Brace: Thoracolumbosacral orthotic;Applied in sitting position Restrictions Weight Bearing Restrictions: No     Mobility  Bed Mobility Overal bed mobility: Needs Assistance Bed Mobility: Rolling, Sidelying to Sit, Sit to Sidelying Rolling: Mod assist, +2 for physical assistance Sidelying to sit: Mod assist, +2 for physical assistance, HOB elevated     Sit to sidelying: +2 for physical assistance, Mod assist General bed mobility comments: multimodal cues for technique, use of bed rail, assist for LEs out of bed. Trunk assist to come to sitting. Pt with poor sitting balance and demonstrating heavy posterior lean at times.    Transfers Overall transfer level: Needs assistance Equipment used: Rolling walker (2 wheels) Transfers: Sit to/from Stand Sit to Stand: +2 physical assistance, Max assist, From elevated surface          Lateral/Scoot Transfers: Mod assist, +2 physical assistance General transfer comment: Attempted x3. On third standing attempt, pt was able to achieve almost a full stand with improved posture and increased hip/knee extension. However, unable to fully extend hips and knees to full upright position. Bed pad utilized to assist with lateral scoots up towards HOB at end of session.    Ambulation/Gait               General Gait Details: Unable   Stairs             Wheelchair Mobility    Modified Rankin (Stroke Patients Only)       Balance Overall balance assessment: Needs assistance Sitting-balance support: Bilateral upper extremity supported, Single extremity supported, No upper extremity supported Sitting balance-Leahy Scale: Poor Sitting balance - Comments: Pt requiring intermittent A for sitting during brace application.   Standing balance support: Reliant on assistive device for balance, Bilateral upper extremity supported Standing balance-Leahy Scale: Zero Standing balance  comment: +2 required                             Cognition Arousal/Alertness: Awake/alert Behavior During Therapy: WFL for tasks assessed/performed Overall Cognitive Status: Within Functional Limits for tasks assessed                                          Exercises  LAQ seated EOB x10 RLE, x10 LLE    General Comments        Pertinent Vitals/Pain Pain Assessment Pain Assessment: Faces Faces Pain Scale: Hurts a little bit Pain Location: back Pain Descriptors / Indicators: Operative site guarding, Aching, Discomfort Pain Intervention(s): Limited activity within patient's tolerance, Monitored during session, Repositioned    Home Living                          Prior Function            PT Goals (current goals can now be found in the care plan section) Acute Rehab PT Goals Patient Stated Goal: Be able to walk to the door of the room PT Goal Formulation: With patient/family Time For Goal Achievement: 04/28/22 Potential to Achieve Goals: Good Progress towards PT goals: Progressing toward goals    Frequency    Min 5X/week      PT Plan Current plan remains appropriate    Co-evaluation              AM-PAC PT "6 Clicks" Mobility   Outcome Measure  Help needed turning from your back to your side while in a flat bed without using bedrails?: A Lot Help needed moving from lying on your back to sitting on the side of a flat bed without using bedrails?: Total Help needed moving to and from a bed to a chair (including a wheelchair)?: Total Help needed standing up from a chair using your arms (e.g., wheelchair or bedside chair)?: Total Help needed to walk in hospital room?: Total Help needed climbing 3-5 steps with a railing? : Total 6 Click Score: 7    End of Session Equipment Utilized During Treatment: Gait belt;Back brace Activity Tolerance: Patient tolerated treatment well Patient left: in bed;with call bell/phone within reach;with bed alarm set Nurse  Communication: Mobility status;Need for lift equipment PT Visit Diagnosis: Unsteadiness on feet (R26.81);Pain;Difficulty in walking, not elsewhere classified (R26.2) Pain - part of body:  (back)     Time: 0240-9735 PT Time Calculation (min) (ACUTE ONLY): 26 min  Charges:  $Therapeutic Activity: 23-37 mins                     Conni Slipper, PT, DPT Acute Rehabilitation Services Secure Chat Preferred Office: [phone]    Marylynn Pearson 04/18/2022, 3:43 PM

## 2022-04-24 ENCOUNTER — Encounter (HOSPITAL_COMMUNITY): Payer: Self-pay | Admitting: Orthopedic Surgery

## 2022-05-12 ENCOUNTER — Encounter (HOSPITAL_COMMUNITY): Payer: Self-pay

## 2022-05-12 ENCOUNTER — Observation Stay (HOSPITAL_COMMUNITY)
Admission: EM | Admit: 2022-05-12 | Discharge: 2022-05-26 | Disposition: A | Discharge status: Skilled Nursing Facility | Payer: No Typology Code available for payment source | Attending: Orthopedic Surgery | Admitting: Orthopedic Surgery

## 2022-05-12 DIAGNOSIS — J45909 Unspecified asthma, uncomplicated: Secondary | ICD-10-CM | POA: Diagnosis not present

## 2022-05-12 DIAGNOSIS — I10 Essential (primary) hypertension: Secondary | ICD-10-CM | POA: Insufficient documentation

## 2022-05-12 DIAGNOSIS — M545 Low back pain, unspecified: Secondary | ICD-10-CM | POA: Diagnosis not present

## 2022-05-12 DIAGNOSIS — G8918 Other acute postprocedural pain: Principal | ICD-10-CM | POA: Insufficient documentation

## 2022-05-12 DIAGNOSIS — Z981 Arthrodesis status: Secondary | ICD-10-CM

## 2022-05-12 DIAGNOSIS — M5459 Other low back pain: Secondary | ICD-10-CM | POA: Diagnosis present

## 2022-05-12 DIAGNOSIS — Z79899 Other long term (current) drug therapy: Secondary | ICD-10-CM | POA: Insufficient documentation

## 2022-05-12 NOTE — ED Triage Notes (Signed)
Pt transferred here via [facility] from [place]. Pt had back surgery done here in July w/ Dr Luiz Blare, pt went to rehab and was discharged today. Today he has increased back and leg pain since being home. Dr Luiz Blare wanted pt transported here for MRI. 123/89, 79HR, 96% RA, 98.2

## 2022-05-13 ENCOUNTER — Observation Stay (HOSPITAL_COMMUNITY): Payer: No Typology Code available for payment source

## 2022-05-13 ENCOUNTER — Other Ambulatory Visit: Payer: Self-pay

## 2022-05-13 ENCOUNTER — Emergency Department (HOSPITAL_COMMUNITY): Payer: No Typology Code available for payment source

## 2022-05-13 DIAGNOSIS — M5459 Other low back pain: Secondary | ICD-10-CM | POA: Diagnosis present

## 2022-05-13 DIAGNOSIS — G8918 Other acute postprocedural pain: Principal | ICD-10-CM | POA: Diagnosis present

## 2022-05-13 LAB — BASIC METABOLIC PANEL
Anion gap: 7 (ref 5–15)
BUN: 14 mg/dL (ref 8–23)
CO2: 25 mmol/L (ref 22–32)
Calcium: 9.3 mg/dL (ref 8.9–10.3)
Chloride: 104 mmol/L (ref 98–111)
Creatinine, Ser: 0.85 mg/dL (ref 0.61–1.24)
GFR, Estimated: >60 mL/min (ref >60)
Glucose, Bld: 101 mg/dL — ABNORMAL HIGH (ref 70–99)
Potassium: 3.1 mmol/L — ABNORMAL LOW (ref 3.5–5.1)
Sodium: 136 mmol/L (ref 135–145)

## 2022-05-13 LAB — CBC WITH DIFFERENTIAL/PLATELET
Abs Immature Granulocytes: 0.02 10*3/uL (ref 0.00–0.07)
Basophils Absolute: 0 10*3/uL (ref 0.0–0.1)
Basophils Relative: 0 %
Eosinophils Absolute: 0.1 10*3/uL (ref 0.0–0.5)
Eosinophils Relative: 2 %
HCT: 38.4 % — ABNORMAL LOW (ref 39.0–52.0)
Hemoglobin: 13.1 g/dL (ref 13.0–17.0)
Immature Granulocytes: 0 %
Lymphocytes Relative: 38 %
Lymphs Abs: 2 10*3/uL (ref 0.7–4.0)
MCH: 30.2 pg (ref 26.0–34.0)
MCHC: 34.1 g/dL (ref 30.0–36.0)
MCV: 88.5 fL (ref 80.0–100.0)
Monocytes Absolute: 0.7 10*3/uL (ref 0.1–1.0)
Monocytes Relative: 13 %
Neutro Abs: 2.4 10*3/uL (ref 1.7–7.7)
Neutrophils Relative %: 47 %
Platelets: 125 10*3/uL — ABNORMAL LOW (ref 150–400)
RBC: 4.34 MIL/uL (ref 4.22–5.81)
RDW: 11.9 % (ref 11.5–15.5)
WBC: 5.2 10*3/uL (ref 4.0–10.5)
nRBC: 0 % (ref 0.0–0.2)

## 2022-05-13 LAB — CBC
HCT: 37.1 % — ABNORMAL LOW (ref 39.0–52.0)
Hemoglobin: 13 g/dL (ref 13.0–17.0)
MCH: 30.7 pg (ref 26.0–34.0)
MCHC: 35 g/dL (ref 30.0–36.0)
MCV: 87.7 fL (ref 80.0–100.0)
Platelets: 127 10*3/uL — ABNORMAL LOW (ref 150–400)
RBC: 4.23 MIL/uL (ref 4.22–5.81)
RDW: 11.9 % (ref 11.5–15.5)
WBC: 4.4 10*3/uL (ref 4.0–10.5)
nRBC: 0 % (ref 0.0–0.2)

## 2022-05-13 LAB — C-REACTIVE PROTEIN: CRP: 6.3 mg/dL — ABNORMAL HIGH (ref <1.0)

## 2022-05-13 LAB — SEDIMENTATION RATE: Sed Rate: 54 mm/hr — ABNORMAL HIGH (ref 0–16)

## 2022-05-13 LAB — URINALYSIS, ROUTINE W REFLEX MICROSCOPIC
Bilirubin Urine: NEGATIVE
Glucose, UA: NEGATIVE mg/dL
Hgb urine dipstick: NEGATIVE
Ketones, ur: NEGATIVE mg/dL
Leukocytes,Ua: NEGATIVE
Nitrite: NEGATIVE
Protein, ur: NEGATIVE mg/dL
Specific Gravity, Urine: >1.046 — ABNORMAL HIGH (ref 1.005–1.030)
pH: 5 (ref 5.0–8.0)

## 2022-05-13 MED ORDER — HYDROMORPHONE HCL 1 MG/ML IJ SOLN
0.5000 mg | INTRAMUSCULAR | Status: DC | PRN
  Administered 2022-05-15: 1 mg via INTRAVENOUS
  Filled 2022-05-13: qty 1

## 2022-05-13 MED ORDER — ONDANSETRON HCL 4 MG PO TABS: 4.0000 mg | ORAL_TABLET | Freq: Four times a day (QID) | ORAL | Status: DC | PRN

## 2022-05-13 MED ORDER — METHOCARBAMOL 500 MG PO TABS
500.0000 mg | ORAL_TABLET | Freq: Four times a day (QID) | ORAL | Status: DC | PRN
  Administered 2022-05-14 – 2022-05-23 (×8): 500 mg via ORAL
  Filled 2022-05-13 (×8): qty 1

## 2022-05-13 MED ORDER — ACETAMINOPHEN 325 MG PO TABS
325.0000 mg | ORAL_TABLET | Freq: Four times a day (QID) | ORAL | Status: DC | PRN
  Administered 2022-05-22: 650 mg via ORAL
  Filled 2022-05-13: qty 2

## 2022-05-13 MED ORDER — DOCUSATE SODIUM 100 MG PO CAPS
100.0000 mg | ORAL_CAPSULE | Freq: Two times a day (BID) | ORAL | Status: DC
  Administered 2022-05-13 – 2022-05-26 (×23): 100 mg via ORAL
  Filled 2022-05-13 (×25): qty 1

## 2022-05-13 MED ORDER — METHOCARBAMOL 1000 MG/10ML IJ SOLN: 500.0000 mg | Freq: Four times a day (QID) | INTRAVENOUS | Status: DC | PRN

## 2022-05-13 MED ORDER — OXYCODONE HCL 5 MG PO TABS
5.0000 mg | ORAL_TABLET | ORAL | Status: DC | PRN
  Administered 2022-05-13 – 2022-05-16 (×7): 10 mg via ORAL
  Filled 2022-05-13 (×8): qty 2
  Filled 2022-05-13: qty 1

## 2022-05-13 MED ORDER — ONDANSETRON HCL 4 MG/2ML IJ SOLN: 4.0000 mg | Freq: Four times a day (QID) | INTRAMUSCULAR | Status: DC | PRN

## 2022-05-13 MED ORDER — MAGNESIUM CITRATE PO SOLN: 1.0000 | Freq: Once | ORAL | Status: DC | PRN

## 2022-05-13 MED ORDER — BISACODYL 5 MG PO TBEC
5.0000 mg | DELAYED_RELEASE_TABLET | Freq: Every day | ORAL | Status: DC | PRN
  Administered 2022-05-16 – 2022-05-24 (×4): 5 mg via ORAL
  Filled 2022-05-13 (×4): qty 1

## 2022-05-13 MED ORDER — HYDROMORPHONE HCL 1 MG/ML IJ SOLN
1.0000 mg | Freq: Once | INTRAMUSCULAR | Status: AC
  Administered 2022-05-13: 1 mg via INTRAVENOUS
  Filled 2022-05-13: qty 1

## 2022-05-13 MED ORDER — ZOLPIDEM TARTRATE 5 MG PO TABS
5.0000 mg | ORAL_TABLET | Freq: Every evening | ORAL | Status: DC | PRN
  Administered 2022-05-14 – 2022-05-26 (×9): 5 mg via ORAL
  Filled 2022-05-13 (×9): qty 1

## 2022-05-13 MED ORDER — DEXTROSE-NACL 5-0.45 % IV SOLN: INTRAVENOUS | Status: DC

## 2022-05-13 MED ORDER — GADOBUTROL 1 MMOL/ML IV SOLN
10.0000 mL | Freq: Once | INTRAVENOUS | Status: AC | PRN
  Administered 2022-05-13: 10 mL via INTRAVENOUS

## 2022-05-13 MED ORDER — ACETAMINOPHEN 500 MG PO TABS
1000.0000 mg | ORAL_TABLET | Freq: Four times a day (QID) | ORAL | Status: AC
  Administered 2022-05-13 – 2022-05-14 (×4): 1000 mg via ORAL
  Filled 2022-05-13 (×4): qty 2

## 2022-05-13 MED ORDER — HYDROMORPHONE HCL 1 MG/ML IJ SOLN: 1.0000 mg | INTRAMUSCULAR | Status: DC | PRN

## 2022-05-13 NOTE — ED Notes (Signed)
ED TO INPATIENT HANDOFF REPORT  ED Nurse Name and Phone #: [phone]  S Name/Age/Gender Evan Morgan 71 y.o. male Room/Bed: 013C/013C  Code Status   Code Status: Full Code  Home/SNF/Other Patient came from home, but unsure if that will be his discharge destination. Patient oriented to: self, place, time, and situation Is this baseline? Yes   Triage Complete: Triage complete  Chief Complaint Post-op pain [G89.18] Intractable low back pain [M54.59]  Triage Note Pt transferred here via [facility] from [place]. Pt had back surgery done here in July w/ Dr Luiz Blare, pt went to rehab and was discharged today. Today he has increased back and leg pain since being home. Dr Luiz Blare wanted pt transported here for MRI. 123/89, 79HR, 96% RA, 98.2    Allergies Allergies  Allergen Reactions   Amlodipine Other (See Comments)    Unknown reaction    Codeine Nausea And Vomiting   Lisinopril Cough    Level of Care/Admitting Diagnosis ED Disposition     ED Disposition  Admit   Condition  --   Comment  Hospital Area: [facility] [[id number]]  Level of Care: Med-Surg [16]  May admit patient to [facility] or [facility] if equivalent level of care is available:: Yes  Covid Evaluation: Asymptomatic - no recent exposure (last 10 days) testing not required  Diagnosis: Intractable low back pain [720112]  Admitting Physician: Jodi Geralds [[id number]]  Attending Physician: Jodi Geralds [[id number]]  Bed request comments: spine floor- where post op spine pts go  Certification:: I certify this patient will need inpatient services for at least 2 midnights  Estimated Length of Stay: 3          B Medical/Surgery History Past Medical History:  Diagnosis Date   Arthritis    knees   Asthma    As a child   History of hepatitis C    Hypertension    Past Surgical History:  Procedure Laterality Date   ALLOGRAFT APPLICATION Left 04/13/2022   Procedure: ALLOGRAFT  APPLICATION;  Surgeon: Estill Bamberg, MD;  Location: [facility] OR;  Service: Orthopedics;  Laterality: Left;   ANTERIOR LAT LUMBAR FUSION  07/23/2019   Procedure: LUMBAR TWO-THREE LEFT LATERAL INTERBODY FUSION WITH INSTRUMENTATION AND ALLOGRAFT;  Surgeon: Estill Bamberg, MD;  Location: [facility] OR;  Service: Orthopedics;;   BACK SURGERY     x3   TRANSFORAMINAL LUMBAR INTERBODY FUSION (TLIF) WITH PEDICLE SCREW FIXATION 1 LEVEL Left 04/13/2022   Procedure: HARDWARE REMOVAL  LEFT-SIDED LUMBAR 4 - LUMBAR 5 TRANSFORAMINAL LUMBAR INTERBODY FUSION AND DECOMPRESSION WITH INSTRUMENTATION;  Surgeon: Estill Bamberg, MD;  Location: [facility] OR;  Service: Orthopedics;  Laterality: Left;     A IV Location/Drains/Wounds Patient Lines/Drains/Airways Status     Active Line/Drains/Airways     Name Placement date Placement time Site Days   Peripheral IV 05/13/22 20 G Anterior;Left Forearm 05/13/22  0126  Forearm  less than 1   External Urinary Catheter 04/15/22  0700  --  28   Incision (Closed) 07/23/19 Back 07/23/19  1113  -- 1025   Incision (Closed) 07/24/19 Back Other (Comment) 07/24/19  1216  -- 1024   Incision (Closed) 04/13/22 Back Other (Comment) 04/13/22  0842  -- 30            Intake/Output Last 24 hours No intake or output data in the 24 hours ending 05/13/22 1332  Labs/Imaging Results for orders placed or performed during the hospital encounter of 05/12/22 (from the past  48 hour(s))  Urinalysis, Routine w reflex microscopic Urine, Clean Catch     Status: Abnormal   Collection Time: 05/13/22  1:15 AM  Result Value Ref Range   Color, Urine YELLOW YELLOW   APPearance CLEAR CLEAR   Specific Gravity, Urine >1.046 (H) 1.005 - 1.030   pH 5.0 5.0 - 8.0   Glucose, UA NEGATIVE NEGATIVE mg/dL   Hgb urine dipstick NEGATIVE NEGATIVE   Bilirubin Urine NEGATIVE NEGATIVE   Ketones, ur NEGATIVE NEGATIVE mg/dL   Protein, ur NEGATIVE NEGATIVE mg/dL   Nitrite NEGATIVE NEGATIVE   Leukocytes,Ua NEGATIVE NEGATIVE     Comment: Performed at [facility], [address] [place], [place] [address]  Basic metabolic panel     Status: Abnormal   Collection Time: 05/13/22  1:23 AM  Result Value Ref Range   Sodium 136 135 - 145 mmol/L   Potassium 3.1 (L) 3.5 - 5.1 mmol/L   Chloride 104 98 - 111 mmol/L   CO2 25 22 - 32 mmol/L   Glucose, Bld 101 (H) 70 - 99 mg/dL    Comment: Glucose reference range applies only to samples taken after fasting for at least 8 hours.   BUN 14 8 - 23 mg/dL   Creatinine, Ser 6.040.85 0.61 - 1.24 mg/dL   Calcium 9.3 8.9 - 54.010.3 mg/dL   GFR, Estimated >98>60 >11>60 mL/min    Comment: (NOTE) Calculated using the CKD-EPI Creatinine Equation (2021)    Anion gap 7 5 - 15    Comment: Performed at [facility], [address] [place], [place] [address]  CBC with Differential     Status: Abnormal   Collection Time: 05/13/22  1:23 AM  Result Value Ref Range   WBC 5.2 4.0 - 10.5 K/uL   RBC 4.34 4.22 - 5.81 MIL/uL   Hemoglobin 13.1 13.0 - 17.0 g/dL   HCT 29.538.4 (L) 62.139.0 - 30.852.0 %   MCV 88.5 80.0 - 100.0 fL   MCH 30.2 26.0 - 34.0 pg   MCHC 34.1 30.0 - 36.0 g/dL   RDW 65.711.9 84.611.5 - 96.215.5 %   Platelets 125 (L) 150 - 400 K/uL   nRBC 0.0 0.0 - 0.2 %   Neutrophils Relative % 47 %   Neutro Abs 2.4 1.7 - 7.7 K/uL   Lymphocytes Relative 38 %   Lymphs Abs 2.0 0.7 - 4.0 K/uL   Monocytes Relative 13 %   Monocytes Absolute 0.7 0.1 - 1.0 K/uL   Eosinophils Relative 2 %   Eosinophils Absolute 0.1 0.0 - 0.5 K/uL   Basophils Relative 0 %   Basophils Absolute 0.0 0.0 - 0.1 K/uL   Immature Granulocytes 0 %   Abs Immature Granulocytes 0.02 0.00 - 0.07 K/uL    Comment: Performed at [facility], [address] [place], [place] [address]  CBC     Status: Abnormal   Collection Time: 05/13/22  9:07 AM  Result Value Ref Range   WBC 4.4 4.0 - 10.5 K/uL   RBC 4.23 4.22 - 5.81 MIL/uL   Hemoglobin 13.0 13.0 - 17.0 g/dL   HCT 13.237.1 (L) 44.039.0 - 10.252.0 %   MCV 87.7 80.0 - 100.0 fL   MCH 30.7 26.0 - 34.0  pg   MCHC 35.0 30.0 - 36.0 g/dL   RDW 72.511.9 36.611.5 - 44.015.5 %   Platelets 127 (L) 150 - 400 K/uL    Comment: REPEATED TO VERIFY   nRBC 0.0 0.0 - 0.2 %  Comment: Performed at [facility], [address] [place], [place] [address]  C-reactive protein     Status: Abnormal   Collection Time: 05/13/22  9:07 AM  Result Value Ref Range   CRP 6.3 (H) <1.0 mg/dL    Comment: Performed at [facility], [address], [place], [place] [address]  Sedimentation rate     Status: Abnormal   Collection Time: 05/13/22  9:07 AM  Result Value Ref Range   Sed Rate 54 (H) 0 - 16 mm/hr    Comment: Performed at [facility], [address], [place], [place] [address]   MR Lumbar Spine W Wo Contrast  Result Date: 05/13/2022 CLINICAL DATA:  70 year old male status post lumbar surgeries, most recently month. Discharged from rehab yesterday. Unable to stand now. EXAM: MRI LUMBAR SPINE WITHOUT AND WITH CONTRAST TECHNIQUE: Multiplanar and multiecho pulse sequences of the lumbar spine were obtained without and with intravenous contrast. CONTRAST:  64mL GADAVIST GADOBUTROL 1 MMOL/ML IV SOLN COMPARISON:  Lumbar MRI 02/01/2022.  CT lumbar spine 02/06/2022. FINDINGS: Segmentation: Normal on the May CT, same numbering system used on MRI at that time. Alignment: Stable straightening of lumbar lordosis with superimposed mild anterolisthesis of L3 on L4. Vertebrae: New L4-L5 interbody implant and L5 pedicle screws since May. Mild hardware susceptibility artifact but evidence of postoperative endplate marrow edema there. Left L4 laminectomy is new. Normal background bone marrow signal. Visible sacrum and SI joints appear intact. Conus medullaris and cauda equina: Conus extends to the T12-L1 level. No lower spinal cord or conus signal abnormality. Abnormal dural thickening and enhancement at L4-L5 eccentric to the left. See series 11, image 30). Heterogeneous left laminectomy space fluid collection there, and  thickened, homogeneously enhancing posterior epidural space extending up to the L3-L4 level (series 11, image 22). No abnormal intradural enhancement identified. Paraspinal and other soft tissues: Small volume superficial multifocal tracking fluid from the L4-L5 laminectomy space as seen on series 7, image 11. Stable visible abdominal viscera including nonenhancing benign appearing renal cysts *INSERT* macro Disc levels: T11-T12 through L2-L3: Stable since 02/01/2022 MRI. Previous interbody fusion at the latter. L3-L4: Mild new multifactorial spinal stenosis at this previously fused and decompressed level appears related to bulky posterior epidural space granulation versus edema or complex fluid (seemingly homogeneously enhancing on series 11, image 22. Stable multifactorial L3 neural foraminal stenosis which is moderate to severe in greater on the right. L4-L5: Recent postoperative changes including left laminectomy. Bulky left side synovial cyst here previously. But continued moderate to severe spinal and lateral recess stenosis (series 9, image 29) related to residual moderate to severe right posterior element hypertrophy and left-side postoperative laminectomy space fluid collection. Dural thickening and enhancement here eccentric to the left. Moderate to severe bilateral L4 foraminal stenosis although foraminal patency does appear improved. L5-S1: Increased epidural lipomatosis here. Otherwise stable disc bulging, endplate spurring and facet hypertrophy. Moderate to severe left and mild to moderate right L5 foraminal stenosis appears stable. IMPRESSION: 1. Multilevel lumbar spine surgery with recent postoperative changes at L4-L5 including left laminectomy. But ongoing severe spinal and lateral recess stenosis there, in large part due to heterogeneous fluid collection in the laminectomy space with surrounding dural and/or epidural thickening and enhancement. Query postoperative hematoma or seroma. No strong  evidence of spinal infection although there is some dural enhancement there. 2. And mild new spinal stenosis at L3-L4 since May also related to bulky posterior epidural complex fluid or soft tissue contiguous  with the above. 3. Other lumbar levels are stable since 02/01/2022 MRI. Study discussed by telephone with Dr. Luiz Blare on 05/13/2022 at 08:32 . Electronically Signed   By: Odessa Fleming M.D.   On: 05/13/2022 08:40   MR THORACIC SPINE WO CONTRAST  Result Date: 05/13/2022 CLINICAL DATA:  Initial evaluation for acute mid back pain. EXAM: MRI CERVICAL AND THORACIC SPINE WITHOUT CONTRAST TECHNIQUE: Multiplanar and multiecho pulse sequences of the cervical spine, to include the craniocervical junction and cervicothoracic junction, and the thoracic spine, were obtained without intravenous contrast. COMPARISON:  Prior MRI from 03/05/2022. FINDINGS: MRI CERVICAL SPINE FINDINGS Alignment: Examination mildly degraded by motion artifact. Trace degenerative anterolisthesis of C6 on C7 and C7 on T1. Alignment otherwise normal preservation of the normal cervical lordosis. Vertebrae: Vertebral body height maintained without acute or chronic fracture. Bone marrow signal intensity within normal limits. No discrete or worrisome osseous lesions. No abnormal marrow edema. Cord: Normal signal and morphology. No convincing cord signal changes on this mildly motion degraded exam. Posterior Fossa, vertebral arteries, paraspinal tissues: Visualized brain and posterior fossa within normal limits. Craniocervical junction within normal limits. Paraspinous soft tissues demonstrate no acute finding. Markedly hypoplastic left vertebral artery with poorly visualized flow void, possibly occluded, stable from prior. Preserved flow voids seen within the contralateral dominant right vertebral artery. Incidental note made of a small 4 mm cystic lesion at the right upper neck (series 4, image 10), indeterminate, but stable from prior, and of doubtful  significance. Disc levels: C2-C3: Minimal disc bulge. Moderate left with mild right facet hypertrophy. No significant spinal stenosis. Mild left C3 foraminal narrowing. Right neural foramen remains patent. C3-C4: Broad-based posterior disc bulge indents the ventral thecal sac. Mild facet and ligament flavum hypertrophy. Resultant mild spinal stenosis. Foramina remain patent. C4-C5: Broad-based central disc protrusion indents the ventral thecal sac (series 5, image 22). Mild facet and ligament flavum hypertrophy. Mild spinal stenosis. Superimposed bilateral uncovertebral spurring with resultant mild left with mild-to-moderate right C5 foraminal stenosis. C5-C6: Central disc protrusion indents the ventral thecal sac, contacting the ventral cord (series 5, image 27). Minimal cord flattening without cord signal changes. Superimposed facet and ligament flavum hypertrophy. Resultant mild to moderate spinal stenosis. Severe left with moderate right C6 foraminal narrowing. C6-C7: Central to right paracentral disc protrusion (series 4, image 31). Superimposed bilateral facet and uncovertebral hypertrophy. No significant spinal stenosis. Moderate left with mild right C7 foraminal stenosis. C7-T1: Trace anterolisthesis. Mild disc bulge with bilateral facet hypertrophy. No spinal stenosis. Mild bilateral C8 foraminal narrowing. MRI THORACIC SPINE FINDINGS Alignment: Examination technically limited as the patient was unable to tolerate the full length of the exam. No axial images were performed. Additionally, the provided images are degraded by motion artifact. Vertebral bodies normally aligned with preservation of the normal thoracic kyphosis. No significant listhesis. Vertebrae: Vertebral body height maintained without acute or chronic fracture. Bone marrow signal intensity within normal limits. No worrisome osseous lesions. No definite abnormal marrow edema, although the STIR sequence is markedly degraded by motion. Cord:   Grossly normal signal and morphology on this limited exam. Paraspinal and other soft tissues: Postoperative changes partially visualized within the upper posterior lumbar soft tissues. Paraspinous soft tissues demonstrate no other definite abnormality. Disc levels: T5-6: Probable central disc extrusion with superior migration (series 19, image 10). No significant spinal stenosis. Foramina remain patent. C6-7: Disc bulge with probable small central disc protrusion and annular fissure. No significant spinal stenosis. Foramina remain patent. T7-8: Disc desiccation with mild disc  bulge. No significant stenosis. T8-9: Disc desiccation with mild disc bulge. No significant stenosis. T9-10: Disc desiccation with mild disc bulge. Mild facet hypertrophy. No significant spinal stenosis. Mild bilateral foraminal narrowing. T10-11: Central disc protrusion indents the ventral thecal sac (series 19, image 10). Probable minimal flattening of the ventral cord without definite cord signal changes or significant spinal stenosis. Foramina appear patent. Otherwise, no other significant disc pathology seen within the thoracic spine on this limited exam. No other appreciable stenosis or neural impingement. IMPRESSION: MRI CERVICAL SPINE IMPRESSION: 1. No acute abnormality within the cervical spine. 2. Multilevel cervical spondylosis with resultant diffuse spinal stenosis at C3-4 through C5-6, most pronounced at C5-6 where stenosis is mild to moderate in nature. 3. Multifactorial degenerative changes with resultant multilevel foraminal narrowing as above. Notable findings include mild-to-moderate right C5 foraminal stenosis, severe left with moderate right C6 foraminal narrowing, with moderate left C7 foraminal narrowing. 4. 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 MRI THORACIC SPINE IMPRESSION: 1. Technically limited exam due to motion artifact and the patient's inability to tolerate the full  length of the study. No axial images were obtained. 2. No acute abnormality within the thoracic spine. 3. Multilevel spondylosis at T5-6 through T10-11 as above, which could contribute to mid back pain. No significant spinal stenosis. Mild bilateral foraminal narrowing at T9-10. Electronically Signed   By: Rise Mu M.D.   On: 05/13/2022 05:03   MR CERVICAL SPINE WO CONTRAST  Result Date: 05/13/2022 CLINICAL DATA:  Initial evaluation for acute mid back pain. EXAM: MRI CERVICAL AND THORACIC SPINE WITHOUT CONTRAST TECHNIQUE: Multiplanar and multiecho pulse sequences of the cervical spine, to include the craniocervical junction and cervicothoracic junction, and the thoracic spine, were obtained without intravenous contrast. COMPARISON:  Prior MRI from 03/05/2022. FINDINGS: MRI CERVICAL SPINE FINDINGS Alignment: Examination mildly degraded by motion artifact. Trace degenerative anterolisthesis of C6 on C7 and C7 on T1. Alignment otherwise normal preservation of the normal cervical lordosis. Vertebrae: Vertebral body height maintained without acute or chronic fracture. Bone marrow signal intensity within normal limits. No discrete or worrisome osseous lesions. No abnormal marrow edema. Cord: Normal signal and morphology. No convincing cord signal changes on this mildly motion degraded exam. Posterior Fossa, vertebral arteries, paraspinal tissues: Visualized brain and posterior fossa within normal limits. Craniocervical junction within normal limits. Paraspinous soft tissues demonstrate no acute finding. Markedly hypoplastic left vertebral artery with poorly visualized flow void, possibly occluded, stable from prior. Preserved flow voids seen within the contralateral dominant right vertebral artery. Incidental note made of a small 4 mm cystic lesion at the right upper neck (series 4, image 10), indeterminate, but stable from prior, and of doubtful significance. Disc levels: C2-C3: Minimal disc bulge.  Moderate left with mild right facet hypertrophy. No significant spinal stenosis. Mild left C3 foraminal narrowing. Right neural foramen remains patent. C3-C4: Broad-based posterior disc bulge indents the ventral thecal sac. Mild facet and ligament flavum hypertrophy. Resultant mild spinal stenosis. Foramina remain patent. C4-C5: Broad-based central disc protrusion indents the ventral thecal sac (series 5, image 22). Mild facet and ligament flavum hypertrophy. Mild spinal stenosis. Superimposed bilateral uncovertebral spurring with resultant mild left with mild-to-moderate right C5 foraminal stenosis. C5-C6: Central disc protrusion indents the ventral thecal sac, contacting the ventral cord (series 5, image 27). Minimal cord flattening without cord signal changes. Superimposed facet and ligament flavum hypertrophy. Resultant mild to moderate spinal stenosis. Severe left with moderate right C6 foraminal narrowing. C6-C7: Central to right paracentral  disc protrusion (series 4, image 31). Superimposed bilateral facet and uncovertebral hypertrophy. No significant spinal stenosis. Moderate left with mild right C7 foraminal stenosis. C7-T1: Trace anterolisthesis. Mild disc bulge with bilateral facet hypertrophy. No spinal stenosis. Mild bilateral C8 foraminal narrowing. MRI THORACIC SPINE FINDINGS Alignment: Examination technically limited as the patient was unable to tolerate the full length of the exam. No axial images were performed. Additionally, the provided images are degraded by motion artifact. Vertebral bodies normally aligned with preservation of the normal thoracic kyphosis. No significant listhesis. Vertebrae: Vertebral body height maintained without acute or chronic fracture. Bone marrow signal intensity within normal limits. No worrisome osseous lesions. No definite abnormal marrow edema, although the STIR sequence is markedly degraded by motion. Cord:  Grossly normal signal and morphology on this limited  exam. Paraspinal and other soft tissues: Postoperative changes partially visualized within the upper posterior lumbar soft tissues. Paraspinous soft tissues demonstrate no other definite abnormality. Disc levels: T5-6: Probable central disc extrusion with superior migration (series 19, image 10). No significant spinal stenosis. Foramina remain patent. C6-7: Disc bulge with probable small central disc protrusion and annular fissure. No significant spinal stenosis. Foramina remain patent. T7-8: Disc desiccation with mild disc bulge. No significant stenosis. T8-9: Disc desiccation with mild disc bulge. No significant stenosis. T9-10: Disc desiccation with mild disc bulge. Mild facet hypertrophy. No significant spinal stenosis. Mild bilateral foraminal narrowing. T10-11: Central disc protrusion indents the ventral thecal sac (series 19, image 10). Probable minimal flattening of the ventral cord without definite cord signal changes or significant spinal stenosis. Foramina appear patent. Otherwise, no other significant disc pathology seen within the thoracic spine on this limited exam. No other appreciable stenosis or neural impingement. IMPRESSION: MRI CERVICAL SPINE IMPRESSION: 1. No acute abnormality within the cervical spine. 2. Multilevel cervical spondylosis with resultant diffuse spinal stenosis at C3-4 through C5-6, most pronounced at C5-6 where stenosis is mild to moderate in nature. 3. Multifactorial degenerative changes with resultant multilevel foraminal narrowing as above. Notable findings include mild-to-moderate right C5 foraminal stenosis, severe left with moderate right C6 foraminal narrowing, with moderate left C7 foraminal narrowing. 4. 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 MRI THORACIC SPINE IMPRESSION: 1. Technically limited exam due to motion artifact and the patient's inability to tolerate the full length of the study. No axial images were obtained. 2. No  acute abnormality within the thoracic spine. 3. Multilevel spondylosis at T5-6 through T10-11 as above, which could contribute to mid back pain. No significant spinal stenosis. Mild bilateral foraminal narrowing at T9-10. Electronically Signed   By: Rise Mu M.D.   On: 05/13/2022 05:03    Pending Labs Unresulted Labs (From admission, onward)    None       Vitals/Pain Today's Vitals   05/13/22 1245 05/13/22 1251 05/13/22 1252 05/13/22 1315  BP: 117/68 117/68  109/88  Pulse: 61 62  67  Resp:  16  18  Temp:  98.4 F (36.9 C)    TempSrc:  Oral    SpO2: 98% 98%  100%  PainSc:   3      Isolation Precautions No active isolations  Medications Medications  dextrose 5 %-0.45 % sodium chloride infusion ( Intravenous New Bag/Given 05/13/22 1033)  acetaminophen (TYLENOL) tablet 325-650 mg (has no administration in time range)  acetaminophen (TYLENOL) tablet 1,000 mg (1,000 mg Oral Given 05/13/22 1030)  oxyCODONE (Oxy IR/ROXICODONE) immediate release tablet 5-10 mg (has no administration in time range)  methocarbamol (ROBAXIN)  tablet 500 mg (has no administration in time range)    Or  methocarbamol (ROBAXIN) 500 mg in dextrose 5 % 50 mL IVPB (has no administration in time range)  zolpidem (AMBIEN) tablet 5 mg (has no administration in time range)  docusate sodium (COLACE) capsule 100 mg (has no administration in time range)  bisacodyl (DULCOLAX) EC tablet 5 mg (has no administration in time range)  ondansetron (ZOFRAN) tablet 4 mg (has no administration in time range)    Or  ondansetron (ZOFRAN) injection 4 mg (has no administration in time range)  HYDROmorphone (DILAUDID) injection 0.5-1 mg (has no administration in time range)  HYDROmorphone (DILAUDID) injection 1 mg (1 mg Intravenous Given 05/13/22 0237)  HYDROmorphone (DILAUDID) injection 1 mg (1 mg Intravenous Given 05/13/22 0657)  gadobutrol (GADAVIST) 1 MMOL/ML injection 10 mL (10 mLs Intravenous Contrast Given 05/13/22  0756)    Mobility power wheelchair Moderate fall risk   Focused Assessments     R Recommendations: See Admitting Provider Note  Report given to:   Additional Notes:

## 2022-05-13 NOTE — H&P (Signed)
A pre op hand p   Chief Complaint: Low back pain and lower extremity weakness  HPI: Evan Morgan is a 71 y.o. male who presents for evaluation of Low back pain and lower extremity weakness. The patient ultimately underwent surgery for back pain and lower extremity weakness about a month ago.  He has had minimal improvement since surgery.  He was in a skilled nursing facility but was recently discharged.  His home health aide quit and ultimately he was in a bad social situation.  He presented to the [facility] with pain and weakness.  I was contacted by the nurse practitioner and ultimately he was sent down for evaluation.IHe states that his situation is not necessarily worse than it was before surgery but just really has not improved as he had hoped.  He has been able to go to the bathroom by himself and is always had and felt the urge to go to the bathroom.  He states that he has difficulty standing and walking but that is similar to what it was before his surgery.He has failed conservative measures. Pain is rated as severe.  Past Medical History:  Diagnosis Date   Arthritis    knees   Asthma    As a child   History of hepatitis C    Hypertension    Past Surgical History:  Procedure Laterality Date   ALLOGRAFT APPLICATION Left 3/47/4259   Procedure: ALLOGRAFT APPLICATION;  Surgeon: Phylliss Bob, MD;  Location: [place];  Service: Orthopedics;  Laterality: Left;   ANTERIOR LAT LUMBAR FUSION  07/23/2019   Procedure: LUMBAR TWO-THREE LEFT LATERAL INTERBODY FUSION WITH INSTRUMENTATION AND ALLOGRAFT;  Surgeon: Phylliss Bob, MD;  Location: [place];  Service: Orthopedics;;   BACK SURGERY     x3   TRANSFORAMINAL LUMBAR INTERBODY FUSION (TLIF) WITH PEDICLE SCREW FIXATION 1 LEVEL Left 04/13/2022   Procedure: HARDWARE REMOVAL  LEFT-SIDED LUMBAR 4 - LUMBAR 5 TRANSFORAMINAL LUMBAR INTERBODY FUSION AND DECOMPRESSION WITH INSTRUMENTATION;  Surgeon: Phylliss Bob, MD;  Location: [place];  Service:  Orthopedics;  Laterality: Left;   Social History   Socioeconomic History   Marital status: Single    Spouse name: Not on file   Number of children: 2   Years of education: Not on file   Highest education level: Not on file  Occupational History   Not on file  Tobacco Use   Smoking status: Never   Smokeless tobacco: Never  Vaping Use   Vaping Use: Never used  Substance and Sexual Activity   Alcohol use: Not Currently    Comment: maybe a fifth per week, not all of the time   Drug use: Yes    Frequency: 4.0 times per week    Types: Marijuana    Comment: 3-4 times per week   Sexual activity: Not on file  Other Topics Concern   Not on file  Social History Narrative   Not on file   Social Determinants of Health   Financial Resource Strain: Not on file  Food Insecurity: Not on file  Transportation Needs: Not on file  Physical Activity: Not on file  Stress: Not on file  Social Connections: Not on file   Family History  Problem Relation Age of Onset   Colon cancer Neg Hx    Esophageal cancer Neg Hx    Rectal cancer Neg Hx    Stomach cancer Neg Hx    Allergies  Allergen Reactions   Amlodipine Other (See Comments)  Unknown reaction    Codeine Nausea And Vomiting   Lisinopril Cough   Prior to Admission medications   Medication Sig Start Date End Date Taking? Authorizing Provider  acetaminophen (TYLENOL) 500 MG tablet Take 1,000 mg by mouth every 6 (six) hours as needed for moderate pain.    [provider]  atenolol (TENORMIN) 50 MG tablet Take 50 mg by mouth daily.     [provider]  Cholecalciferol 50 MCG (2000 UT) TABS Take 2,000 Units by mouth daily.     [provider]  finasteride (PROSCAR) 5 MG tablet Take 5 mg by mouth daily.    [provider]  gabapentin (NEURONTIN) 300 MG capsule Take 900 mg by mouth daily.    [provider]  hydrALAZINE (APRESOLINE) 10 MG tablet Take 10 mg by mouth daily.    [provider]  hydrochlorothiazide (HYDRODIURIL) 25 MG tablet Take 25 mg by mouth daily.     [provider]  methocarbamol (ROBAXIN) 500 MG tablet Take 1 tablet (500 mg total) by mouth every 6 (six) hours as needed for muscle spasms. 04/14/22   Phylliss Bob, MD  oxyCODONE-acetaminophen (PERCOCET/ROXICET) 5-325 MG tablet Take 1-2 tablets by mouth every 4 (four) hours as needed for severe pain. 04/14/22   Phylliss Bob, MD  peg 3350 powder (MOVIPREP) 100 g SOLR Take 1 kit by mouth.    [provider]  tamsulosin (FLOMAX) 0.4 MG CAPS capsule Take 0.8 mg by mouth daily.    [provider]  triamcinolone cream (KENALOG) 0.1 % Apply 1 Application topically daily as needed (itching, irritation).    [provider]     Positive ROS: None  All other systems have been reviewed and were otherwise negative with the exception of those mentioned in the HPI and as above.  Physical Exam: Vitals:   05/13/22 0855 05/13/22 0908  BP:  113/85  Pulse:  77  Resp:  18  Temp: 98.2 F (36.8 C) 98.2 F (36.8 C)  SpO2:  97%    General: Alert, no acute distress Cardiovascular: No pedal edema Respiratory: No cyanosis, no use of accessory musculature GI: No organomegaly, abdomen is soft and non-tender Skin: No lesions in the area of chief complaint Neurologic: Sensation intact distally Psychiatric: Patient is competent for consent with normal mood and affect Lymphatic: No axillary or cervical lymphadenopathy  MUSCULOSKELETAL: Upper extremity show normal strength, sensation, and reflexes.  He says that he feels weak but physical exam is normal there.  Lower extremity examination shows symmetric reflexes.  Strength is good except for inability to do strong straight leg raise.  Sensation is subjectively decreased globally in the lower extremities.  This seems to be real as when distracted he really cannot tell when I am touching the lower extremity.Rectal exam shows some tone  with equivocal bulbocavernosus.  He says that he can certainly feel my finger on rectal examination.  MRI: MRI cervical spine is performed and really shows not much change from his previous cervical MRI.  There is no compressive lesions.  Thoracic MRI is performed and shows no compressive lesions to the spinal cord.  Lumbar MRI is performed and though difficult to interpret in the postoperative setting shows some significant fluid around the left side in the area where the old cyst was from the facet.  There is granulation type tissue in the canal that starting to spread a little bit upward from the L4-5 level.  All of it appears to be  not significantly worse than it was before his original surgery.  Assessment/Plan: 71 year old male who had significant lower extremity weakness and ultimately underwent decompressive surgery a month ago.  He has been in a skilled nursing facility for the last month and was recently discharged.  His home health aide quit in because of that he would new he was can have a difficult time being at home alone.  He presented with worsening lower extremity issues.MRI shows some significant compressive lesions but not dramatically worse than before surgery.  I will get a CAT scan to assess whether anything is happening with the hardware.  I have discussed the case with Dr. Lynann Bologna and he is aware of the situation.  He will review the studies on his return Monday night and will make a plan with the patient on Tuesday.  He has not thoughtful based on the information that we have now that surgery will be indicated but certainly will make that final determination once he returns to town.  In the interim the patient will be admitted for pain control and Discussion of ability to find a suitable living situation.  Alta Corning, MD 05/13/2022 9:47 AM

## 2022-05-13 NOTE — ED Provider Notes (Signed)
[facility] EMERGENCY DEPARTMENT Provider Note   CSN: [id number] Arrival date & time: 05/12/22  2332     History  Chief Complaint  Patient presents with   Back Pain    Evan Morgan is a 71 y.o. male.   Back Pain Patient presents as a transfer from [place] in [place].  Had lumbar surgery by Dr. Lynann Bologna in July.  Went to rehab.  Will stand some but not really ambulatory yet.  Has a motorized cart.  Discharged yesterday home and then also has home health aide reportedly quit.  Transferred for admission and MRI.  Patient states he used to be able to stand but now cannot do that.  Pain in the lower back unrelieved his medicines at home.  Dr. Berenice Primas had called with plan of admission to get MRI first.    Past Medical History:  Diagnosis Date   Arthritis    knees   Asthma    As a child   History of hepatitis C    Hypertension     Home Medications Prior to Admission medications   Medication Sig Start Date End Date Taking? Authorizing Provider  acetaminophen (TYLENOL) 500 MG tablet Take 1,000 mg by mouth every 6 (six) hours as needed for moderate pain.    [provider]  atenolol (TENORMIN) 50 MG tablet Take 50 mg by mouth daily.     [provider]  Cholecalciferol 50 MCG (2000 UT) TABS Take 2,000 Units by mouth daily.     [provider]  finasteride (PROSCAR) 5 MG tablet Take 5 mg by mouth daily.    [provider]  gabapentin (NEURONTIN) 300 MG capsule Take 900 mg by mouth daily.    [provider]  hydrALAZINE (APRESOLINE) 10 MG tablet Take 10 mg by mouth daily.    [provider]  hydrochlorothiazide (HYDRODIURIL) 25 MG tablet Take 25 mg by mouth daily.     [provider]  methocarbamol (ROBAXIN) 500 MG tablet Take 1 tablet (500 mg total) by mouth every 6 (six) hours as needed for muscle spasms. 04/14/22   Phylliss Bob, MD  oxyCODONE-acetaminophen (PERCOCET/ROXICET) 5-325 MG tablet Take 1-2  tablets by mouth every 4 (four) hours as needed for severe pain. 04/14/22   Phylliss Bob, MD  peg 3350 powder (MOVIPREP) 100 g SOLR Take 1 kit by mouth.    [provider]  tamsulosin (FLOMAX) 0.4 MG CAPS capsule Take 0.8 mg by mouth daily.    [provider]  triamcinolone cream (KENALOG) 0.1 % Apply 1 Application topically daily as needed (itching, irritation).    [provider]      Allergies    Amlodipine, Codeine, and Lisinopril    Review of Systems   Review of Systems  Musculoskeletal:  Positive for back pain.    Physical Exam Updated Vital Signs BP 138/78   Pulse 79   Temp 98.5 F (36.9 C) (Oral)   Resp 18   SpO2 97%  Physical Exam Vitals and nursing note reviewed.  Chest:     Chest wall: No tenderness.  Abdominal:     Tenderness: There is no abdominal tenderness.  Musculoskeletal:     Cervical back: Neck supple.  Neurological:     Mental Status: He is alert and oriented to person, place, and time.     Comments: Awake and appropriate.  States he feels that his arms are weak but is able to move them.  Decree strength in bilateral  extremities.  Difficult with straight leg raise bilaterally.  Sensation grossly intact on his feet.  However sitting on the bed with perineal palpation but states he cannot really feel it.     ED Results / Procedures / Treatments   Labs (all labs ordered are listed, but only abnormal results are displayed) Labs Reviewed  URINALYSIS, ROUTINE W REFLEX MICROSCOPIC - Abnormal; Notable for the following components:      Result Value   Specific Gravity, Urine >1.046 (*)    All other components within normal limits  BASIC METABOLIC PANEL - Abnormal; Notable for the following components:   Potassium 3.1 (*)    Glucose, Bld 101 (*)    All other components within normal limits  CBC WITH DIFFERENTIAL/PLATELET - Abnormal; Notable for the following components:   HCT 38.4 (*)    Platelets 125 (*)    All other components  within normal limits    EKG None  Radiology MR THORACIC SPINE WO CONTRAST  Result Date: 05/13/2022 CLINICAL DATA:  Initial evaluation for acute mid back pain. EXAM: MRI CERVICAL AND THORACIC SPINE WITHOUT CONTRAST TECHNIQUE: Multiplanar and multiecho pulse sequences of the cervical spine, to include the craniocervical junction and cervicothoracic junction, and the thoracic spine, were obtained without intravenous contrast. COMPARISON:  Prior MRI from 03/05/2022. FINDINGS: MRI CERVICAL SPINE FINDINGS Alignment: Examination mildly degraded by motion artifact. Trace degenerative anterolisthesis of C6 on C7 and C7 on T1. Alignment otherwise normal preservation of the normal cervical lordosis. Vertebrae: Vertebral body height maintained without acute or chronic fracture. Bone marrow signal intensity within normal limits. No discrete or worrisome osseous lesions. No abnormal marrow edema. Cord: Normal signal and morphology. No convincing cord signal changes on this mildly motion degraded exam. Posterior Fossa, vertebral arteries, paraspinal tissues: Visualized brain and posterior fossa within normal limits. Craniocervical junction within normal limits. Paraspinous soft tissues demonstrate no acute finding. Markedly hypoplastic left vertebral artery with poorly visualized flow void, possibly occluded, stable from prior. Preserved flow voids seen within the contralateral dominant right vertebral artery. Incidental note made of a small 4 mm cystic lesion at the right upper neck (series 4, image 10), indeterminate, but stable from prior, and of doubtful significance. Disc levels: C2-C3: Minimal disc bulge. Moderate left with mild right facet hypertrophy. No significant spinal stenosis. Mild left C3 foraminal narrowing. Right neural foramen remains patent. C3-C4: Broad-based posterior disc bulge indents the ventral thecal sac. Mild facet and ligament flavum hypertrophy. Resultant mild spinal stenosis. Foramina remain  patent. C4-C5: Broad-based central disc protrusion indents the ventral thecal sac (series 5, image 22). Mild facet and ligament flavum hypertrophy. Mild spinal stenosis. Superimposed bilateral uncovertebral spurring with resultant mild left with mild-to-moderate right C5 foraminal stenosis. C5-C6: Central disc protrusion indents the ventral thecal sac, contacting the ventral cord (series 5, image 27). Minimal cord flattening without cord signal changes. Superimposed facet and ligament flavum hypertrophy. Resultant mild to moderate spinal stenosis. Severe left with moderate right C6 foraminal narrowing. C6-C7: Central to right paracentral disc protrusion (series 4, image 31). Superimposed bilateral facet and uncovertebral hypertrophy. No significant spinal stenosis. Moderate left with mild right C7 foraminal stenosis. C7-T1: Trace anterolisthesis. Mild disc bulge with bilateral facet hypertrophy. No spinal stenosis. Mild bilateral C8 foraminal narrowing. MRI THORACIC SPINE FINDINGS Alignment: Examination technically limited as the patient was unable to tolerate the full length of the exam. No axial images were performed. Additionally, the provided images are degraded by motion artifact. Vertebral bodies normally aligned with preservation of  the normal thoracic kyphosis. No significant listhesis. Vertebrae: Vertebral body height maintained without acute or chronic fracture. Bone marrow signal intensity within normal limits. No worrisome osseous lesions. No definite abnormal marrow edema, although the STIR sequence is markedly degraded by motion. Cord:  Grossly normal signal and morphology on this limited exam. Paraspinal and other soft tissues: Postoperative changes partially visualized within the upper posterior lumbar soft tissues. Paraspinous soft tissues demonstrate no other definite abnormality. Disc levels: T5-6: Probable central disc extrusion with superior migration (series 19, image 10). No significant spinal  stenosis. Foramina remain patent. C6-7: Disc bulge with probable small central disc protrusion and annular fissure. No significant spinal stenosis. Foramina remain patent. T7-8: Disc desiccation with mild disc bulge. No significant stenosis. T8-9: Disc desiccation with mild disc bulge. No significant stenosis. T9-10: Disc desiccation with mild disc bulge. Mild facet hypertrophy. No significant spinal stenosis. Mild bilateral foraminal narrowing. T10-11: Central disc protrusion indents the ventral thecal sac (series 19, image 10). Probable minimal flattening of the ventral cord without definite cord signal changes or significant spinal stenosis. Foramina appear patent. Otherwise, no other significant disc pathology seen within the thoracic spine on this limited exam. No other appreciable stenosis or neural impingement. IMPRESSION: MRI CERVICAL SPINE IMPRESSION: 1. No acute abnormality within the cervical spine. 2. Multilevel cervical spondylosis with resultant diffuse spinal stenosis at C3-4 through C5-6, most pronounced at C5-6 where stenosis is mild to moderate in nature. 3. Multifactorial degenerative changes with resultant multilevel foraminal narrowing as above. Notable findings include mild-to-moderate right C5 foraminal stenosis, severe left with moderate right C6 foraminal narrowing, with moderate left C7 foraminal narrowing. 4. 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 MRI THORACIC SPINE IMPRESSION: 1. Technically limited exam due to motion artifact and the patient's inability to tolerate the full length of the study. No axial images were obtained. 2. No acute abnormality within the thoracic spine. 3. Multilevel spondylosis at T5-6 through T10-11 as above, which could contribute to mid back pain. No significant spinal stenosis. Mild bilateral foraminal narrowing at T9-10. Electronically Signed   By: Jeannine Boga M.D.   On: 05/13/2022 05:03   MR CERVICAL SPINE  WO CONTRAST  Result Date: 05/13/2022 CLINICAL DATA:  Initial evaluation for acute mid back pain. EXAM: MRI CERVICAL AND THORACIC SPINE WITHOUT CONTRAST TECHNIQUE: Multiplanar and multiecho pulse sequences of the cervical spine, to include the craniocervical junction and cervicothoracic junction, and the thoracic spine, were obtained without intravenous contrast. COMPARISON:  Prior MRI from 03/05/2022. FINDINGS: MRI CERVICAL SPINE FINDINGS Alignment: Examination mildly degraded by motion artifact. Trace degenerative anterolisthesis of C6 on C7 and C7 on T1. Alignment otherwise normal preservation of the normal cervical lordosis. Vertebrae: Vertebral body height maintained without acute or chronic fracture. Bone marrow signal intensity within normal limits. No discrete or worrisome osseous lesions. No abnormal marrow edema. Cord: Normal signal and morphology. No convincing cord signal changes on this mildly motion degraded exam. Posterior Fossa, vertebral arteries, paraspinal tissues: Visualized brain and posterior fossa within normal limits. Craniocervical junction within normal limits. Paraspinous soft tissues demonstrate no acute finding. Markedly hypoplastic left vertebral artery with poorly visualized flow void, possibly occluded, stable from prior. Preserved flow voids seen within the contralateral dominant right vertebral artery. Incidental note made of a small 4 mm cystic lesion at the right upper neck (series 4, image 10), indeterminate, but stable from prior, and of doubtful significance. Disc levels: C2-C3: Minimal disc bulge. Moderate left with mild right facet hypertrophy.  No significant spinal stenosis. Mild left C3 foraminal narrowing. Right neural foramen remains patent. C3-C4: Broad-based posterior disc bulge indents the ventral thecal sac. Mild facet and ligament flavum hypertrophy. Resultant mild spinal stenosis. Foramina remain patent. C4-C5: Broad-based central disc protrusion indents the  ventral thecal sac (series 5, image 22). Mild facet and ligament flavum hypertrophy. Mild spinal stenosis. Superimposed bilateral uncovertebral spurring with resultant mild left with mild-to-moderate right C5 foraminal stenosis. C5-C6: Central disc protrusion indents the ventral thecal sac, contacting the ventral cord (series 5, image 27). Minimal cord flattening without cord signal changes. Superimposed facet and ligament flavum hypertrophy. Resultant mild to moderate spinal stenosis. Severe left with moderate right C6 foraminal narrowing. C6-C7: Central to right paracentral disc protrusion (series 4, image 31). Superimposed bilateral facet and uncovertebral hypertrophy. No significant spinal stenosis. Moderate left with mild right C7 foraminal stenosis. C7-T1: Trace anterolisthesis. Mild disc bulge with bilateral facet hypertrophy. No spinal stenosis. Mild bilateral C8 foraminal narrowing. MRI THORACIC SPINE FINDINGS Alignment: Examination technically limited as the patient was unable to tolerate the full length of the exam. No axial images were performed. Additionally, the provided images are degraded by motion artifact. Vertebral bodies normally aligned with preservation of the normal thoracic kyphosis. No significant listhesis. Vertebrae: Vertebral body height maintained without acute or chronic fracture. Bone marrow signal intensity within normal limits. No worrisome osseous lesions. No definite abnormal marrow edema, although the STIR sequence is markedly degraded by motion. Cord:  Grossly normal signal and morphology on this limited exam. Paraspinal and other soft tissues: Postoperative changes partially visualized within the upper posterior lumbar soft tissues. Paraspinous soft tissues demonstrate no other definite abnormality. Disc levels: T5-6: Probable central disc extrusion with superior migration (series 19, image 10). No significant spinal stenosis. Foramina remain patent. C6-7: Disc bulge with  probable small central disc protrusion and annular fissure. No significant spinal stenosis. Foramina remain patent. T7-8: Disc desiccation with mild disc bulge. No significant stenosis. T8-9: Disc desiccation with mild disc bulge. No significant stenosis. T9-10: Disc desiccation with mild disc bulge. Mild facet hypertrophy. No significant spinal stenosis. Mild bilateral foraminal narrowing. T10-11: Central disc protrusion indents the ventral thecal sac (series 19, image 10). Probable minimal flattening of the ventral cord without definite cord signal changes or significant spinal stenosis. Foramina appear patent. Otherwise, no other significant disc pathology seen within the thoracic spine on this limited exam. No other appreciable stenosis or neural impingement. IMPRESSION: MRI CERVICAL SPINE IMPRESSION: 1. No acute abnormality within the cervical spine. 2. Multilevel cervical spondylosis with resultant diffuse spinal stenosis at C3-4 through C5-6, most pronounced at C5-6 where stenosis is mild to moderate in nature. 3. Multifactorial degenerative changes with resultant multilevel foraminal narrowing as above. Notable findings include mild-to-moderate right C5 foraminal stenosis, severe left with moderate right C6 foraminal narrowing, with moderate left C7 foraminal narrowing. 4. 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 MRI THORACIC SPINE IMPRESSION: 1. Technically limited exam due to motion artifact and the patient's inability to tolerate the full length of the study. No axial images were obtained. 2. No acute abnormality within the thoracic spine. 3. Multilevel spondylosis at T5-6 through T10-11 as above, which could contribute to mid back pain. No significant spinal stenosis. Mild bilateral foraminal narrowing at T9-10. Electronically Signed   By: Jeannine Boga M.D.   On: 05/13/2022 05:03    Procedures Procedures    Medications Ordered in ED Medications   HYDROmorphone (DILAUDID) injection 1 mg (1 mg Intravenous  Given 05/13/22 0237)  HYDROmorphone (DILAUDID) injection 1 mg (1 mg Intravenous Given 05/13/22 1610)    ED Course/ Medical Decision Making/ A&P                           Medical Decision Making Amount and/or Complexity of Data Reviewed Labs: ordered. Radiology: ordered.  Risk Prescription drug management. Decision regarding hospitalization.   Patient transferred from [facility] with worsening back pain.  Did have recent surgery within the last month.  Sent for MRI.  Does have some potential neurodeficits.  But also has had weakness and has not walked really for couple years now.  Potentially decreased perineal and station.  Patient denies urinary retention.  Given pain medicine.  Patient however did not tolerate the complete MRI.  Had cervical and thoracic but not all the views and not with contrast.  More pain medicine.  Discussed with Dr. Berenice Primas who will admit patient and will hopefully get MRI.  Does not appear to need immediate surgery.        Final Clinical Impression(s) / ED Diagnoses Final diagnoses:  Post-op pain    Rx / DC Orders ED Discharge Orders     None         Davonna Belling, MD 05/13/22 [phone]

## 2022-05-13 NOTE — ED Notes (Signed)
Transported to MRI

## 2022-05-13 NOTE — ED Notes (Signed)
Patient remains in MRI 

## 2022-05-14 ENCOUNTER — Inpatient Hospital Stay (HOSPITAL_COMMUNITY): Payer: No Typology Code available for payment source

## 2022-05-14 NOTE — Progress Notes (Signed)
Subjective:  Patient seems more comfortable today.  He is easily moving his legs.  He has had his MRI but not his CAT scan at this point.  It is unclear whether it was ordered.  Objective: Vital signs in last 24 hours: Temp:  [97.6 F (36.4 C)-98.4 F (36.9 C)] 98 F (36.7 C) (08/13 1348) Pulse Rate:  [70-84] 79 (08/13 1348) Resp:  [14-20] 16 (08/13 1348) BP: (112-144)/(68-94) 144/84 (08/13 1348) SpO2:  [94 %-99 %] 94 % (08/13 1348)  Intake/Output from previous day: 08/12 0701 - 08/13 0700 In: 623.2 [I.V.:623.2] Out: 200 [Urine:200] Intake/Output this shift: No intake/output data recorded.  Recent Labs    05/13/22 0123 05/13/22 0907  HGB 13.1 13.0   Recent Labs    05/13/22 0123 05/13/22 0907  WBC 5.2 4.4  RBC 4.34 4.23  HCT 38.4* 37.1*  PLT 125* 127*   Recent Labs    05/13/22 0123  NA 136  K 3.1*  CL 104  CO2 25  BUN 14  CREATININE 0.85  GLUCOSE 101*  CALCIUM 9.3   No results for input(s): "LABPT", "INR" in the last 72 hours.  Neurologically intact ABD soft Neurovascular intact Dorsiflexion/Plantar flexion intact Patient still has difficulty doing a straight leg raise.    Assessment/Plan: 71 year old male who was readmitted to the hospital 1 month after surgical intervention for compressive spinal issues.  He is really struggling to stand and walk and does not feel that he can be at home safely.  He had been in a skilled nursing facility for a month and has returned because of inability to be at home safely.  At this point the patient needs a CAT scan to assess his hardware but is not thoughtful that he is going to need surgical intervention.  Results of the CAT scan could change that thought process.  He will be turned back over to the spine team tomorrow.   Harvie Junior 05/14/2022, 2:48 PM

## 2022-05-14 NOTE — Progress Notes (Addendum)
PT Cancellation Note  Patient Details Name: Evan Morgan MRN: [id number] DOB: [birth year]   Cancelled Treatment:    Reason Eval/Treat Not Completed: Other (comment). Pt with spinal precautions and needs his TLSO for mobility, but it is not currently present. Requested pt to call his family to have them bring it later today, with pt verbalizing understanding. Will plan to follow-up later as time permits.  Addendum 16:44 - Attempted 2 additional times today, 1x around lunch and back brace still not present and 1x around 16:40 but transport was coming to take pt for imaging. Will plan to follow-up tomorrow as able.   Raymond Gurney, PT, DPT Acute Rehabilitation Services  Office: [phone]    Evan Morgan 05/14/2022, 9:15 AM

## 2022-05-15 NOTE — Progress Notes (Addendum)
Patient presented back to the hospital just shy of one month status post L4-L5 decompression and fusion for severe spinal stenosis on 04/13/2022 by Dr. Yevette Edwards. He had prominent preoperative weakness and decreased function and did undergo physical therapy in the hospital and was released to a skilled nursing facility.  Unfortunately he had a very poor experience at the skilled nursing facility.  He states that he did not care for him at all and they would let him lay in his own urine as he could not get help getting to the restroom and they would not come and change to his bed sheets.  Due to his very poor experience he requested to go home and was set up with a full-time home health aide.  Unfortunately the aid quit the first day as he did not want to work all of the hours and the patient is unable to stand or ambulate on his own and presented back to the [place] hospital who transferred him back to [facility].  He states he essentially feels the same today as when he was discharged from [facility], perhaps a touch weaker as he has not gotten any physical therapy for the last 2 days.  He is very frustrated by the poor care he was receiving at the nursing facility and his ability to get help through the [place] in order to stay at home.  He is motivated to restore strength and function and independence.  He states he is having normal bowel and bladder habits and normal appetite.   Physical Exam: Vitals:   05/15/22 0400 05/15/22 0731  BP: 129/83 (!) 142/89  Pulse: 75 82  Resp: 17 18  Temp: 98.4 F (36.9 C) 98.2 F (36.8 C)  SpO2: 97% 97%   He is sitting upright in his chair eating breakfast with TLSO brace applied.  He is able to actively dorsiflex and plantarflex his feet as well as extend and flex at the knees.  He is able to provide some resistance on manual muscle testing but is unable to freely stand on his own. NVI  ADVANCED IMAGING: updated MRIs and CT scans were reviewed demonstrating prominent fluid  collection at the surgical level consistent with expected postoperative fluid collection/edema. Of note, given patient history and smoking status, BMP was utilized for the fusion which also is known to contribute to increased postoperative swelling/fluid. CT imaging demonstrates appropriate positioning of the hardware. Imaging appears most consistent with what would be expected in a patient such as this 4 weeks postop.  POD #30 s/p L4-L5 decompression and fusion with utilization of BMP for severe spinal stenosis and weakness on 04/13/2022.  Current functional status and weakness appears consistent with patient's baseline preoperatively.  Patient historically discharged from the hospital to skilled nursing facility for rehabilitation, however, patient had a very poor experience at this facility and went home with a full time home health aide.  Extremely unfortunately, the aid quit after several hours stating he did not wish to work the long hours and the patient was left alone at home unable to care for himself resulting in his return to the hospital. Patient appears to have stable lower extremity weakness compared to preoperative testing requiring rehabilitation to progress his independence.  - up with PT/OT, encourage ambulation.  Patient would likely benefit from PT/OT two times a day if possible - pain very well controlled, continue current medications as needed - expected postoperative findings on MRI and CT scan.  No additional surgical intervention  is required.  This is currently a social and rehab placement situation - pending evaluation by social work to determine discharge options.  Patient clearly requires extensive physical therapy and rehabilitation and is unable to proceed home safely on his own.  The patient is a veteran but states the [place] denied assisting him.

## 2022-05-15 NOTE — Progress Notes (Signed)
Inpatient Rehab Admissions Coordinator:   Per PT recommendations pt was screened for CIR candidacy by Estill Dooms, PT, DPT.  Do not feel [employer] would approve given no acute diagnosis and do not typically approve "debility" requests.  Per therapy documentation pt stating he does have [facility].  I contacted [facility] and pt does not have hospital benefits (therefore no CIR benefits).  Will not pursue CIR consult for this patient.   Estill Dooms, PT, DPT Admissions Coordinator [phone] 05/15/22  11:12 AM

## 2022-05-15 NOTE — Evaluation (Signed)
Physical Therapy Evaluation Patient Details Name: Evan Morgan MRN: [id number] DOB: 01-02-1951 Today's Date: 05/15/2022  History of Present Illness  Pt is a 71 year old man admitted on 05/12/22 due to back pain, LE weakness and FTT at home after d/c from SNF to home with aide and his aide quit so presented to [place] then transferred to [facility].  Had been d/c a month ago after stay with severe L 4/5 spinal stenosis. Underwent removal of hardware, decompression L4-5 TLIF, R posterolateral fusion on 04/13/2022. PMH significant for HEP C, HTN, back surgeries x 3.  Clinical Impression  Patient presents with decreased mobility reporting limited therapy at SNF since back surgery and went back home but did not have adequate care.  Reports previous to back surgery was able to transfer sit to stand with assistance and utilized power wheelchair for mobility.  Has a hoyer lift at home as well, but hoping to get a transfer board and sit to stand assist device from the [place].  Patient previously denied AIR admission due to insurance, but with VA benefits hopeful for another look.  PT will continue to follow acutely.        Recommendations for follow up therapy are one component of a multi-disciplinary discharge planning process, led by the attending physician.  Recommendations may be updated based on patient status, additional functional criteria and insurance authorization.  Follow Up Recommendations Acute inpatient rehab (3hours/day)      Assistance Recommended at Discharge Intermittent Supervision/Assistance  Patient can return home with the following  Two people to help with walking and/or transfers;A lot of help with bathing/dressing/bathroom;Assistance with cooking/housework;Assist for transportation    Equipment Recommendations Other (comment) (transfer board and reports trying to get Stedy from [place])  Recommendations for Other Services       Functional Status Assessment Patient has had a recent decline in their  functional status and demonstrates the ability to make significant improvements in function in a reasonable and predictable amount of time.     Precautions / Restrictions Precautions Precautions: Fall;Back Required Braces or Orthoses: Spinal Brace Spinal Brace: Thoracolumbosacral orthotic;Applied in sitting position Restrictions Weight Bearing Restrictions: No RLE Weight Bearing: Weight bearing as tolerated LLE Weight Bearing: Weight bearing as tolerated      Mobility  Bed Mobility Overal bed mobility: Needs Assistance Bed Mobility: Sit to Sidelying         Sit to sidelying: +2 for physical assistance, Mod assist General bed mobility comments: assist for legs into bed and for trunk positioning with cues for UE placement    Transfers Overall transfer level: Needs assistance   Transfers: Sit to/from Stand, Bed to chair/wheelchair/BSC Sit to Stand: Max assist, From elevated surface, +2 physical assistance, Mod assist          Lateral/Scoot Transfers: With slide board, Mod assist, +2 physical assistance General transfer comment: in recliner upon entry so slide board to bed with +2 for scooting hips and positioning legs; elevated bed and used back of recliner to pull into standing with two attempts unsuccessful with hips sliding closer to EOB then 1 attempt pt able to lift himself with mod A for safety and balance to partial stand with heavy UE support on back of recliner    Ambulation/Gait                  Stairs            Wheelchair Mobility    Modified Rankin (Stroke Patients Only)  Balance Overall balance assessment: Needs assistance   Sitting balance-Leahy Scale: Fair Sitting balance - Comments: able to sit without UE support when scooted back on bed and reaching about 2-3" out of BOS but severely kyphotic posture and sacral sitting   Standing balance support: Reliant on assistive device for balance Standing balance-Leahy Scale:  Zero Standing balance comment: +2 for standing attempts                             Pertinent Vitals/Pain Pain Assessment Pain Assessment: 0-10 Pain Score: 7  Pain Location: back and legs Pain Descriptors / Indicators: Burning, Numbness Pain Intervention(s): Monitored during session    Home Living Family/patient expects to be discharged to:: Private residence Living Arrangements: Alone Available Help at Discharge: Personal care attendant Type of Home: House Home Access: Other (comment) (has a lift to enter home)       Home Layout: One level Home Equipment: Wheelchair - power;Other (comment);BSC/3in1;Rolling Walker (2 wheels) (hoyer lift) Additional Comments: had aide 12 hours a week then 36 hours just before surgery, thinks aide quit once he got home from rehab due to he did not want that many hours    Prior Function Prior Level of Function : Needs assist             Mobility Comments: transfer to motorized w/c without AD, uses motorized w/c as means of mobility, many falls, uses hoyer lift to get off floor ADLs Comments: assisted for shower transfer, LB bathing and dressing, and toileting     Hand Dominance   Dominant Hand: Right    Extremity/Trunk Assessment   Upper Extremity Assessment Upper Extremity Assessment: Generalized weakness    Lower Extremity Assessment Lower Extremity Assessment: RLE deficits/detail;LLE deficits/detail RLE Deficits / Details: AAROM WFL, strength hip flexion 3-/5, knee extension 3/5, ankle DF 3+/5 RLE Sensation: decreased light touch (absent in lower legs and feet, diminished in thighs) LLE Deficits / Details: AAROM WFL, strength hip flexion 3-/5, knee extension 3-/5, ankle DF 3+/5 LLE Sensation: decreased light touch (absent in lower legs/feet, diminished in thighs)    Cervical / Trunk Assessment Cervical / Trunk Assessment: Kyphotic;Back Surgery  Communication   Communication: No difficulties  Cognition  Arousal/Alertness: Awake/alert Behavior During Therapy: WFL for tasks assessed/performed Overall Cognitive Status: Within Functional Limits for tasks assessed                                          General Comments General comments (skin integrity, edema, etc.): pt with purewick, had L forearm errythema pt relates due to previous IV    Exercises Other Exercises Other Exercises: IN supine performed resisted hip and knee extension R then L x 10   Assessment/Plan    PT Assessment Patient needs continued PT services  PT Problem List Decreased strength;Decreased mobility;Decreased activity tolerance;Decreased balance;Impaired sensation;Pain;Decreased knowledge of precautions;Decreased safety awareness;Impaired tone       PT Treatment Interventions DME instruction;Therapeutic exercise;Wheelchair mobility training;Balance training;Functional mobility training;Therapeutic activities;Patient/family education    PT Goals (Current goals can be found in the Care Plan section)  Acute Rehab PT Goals Patient Stated Goal: to return home, get therapy to be able to walk PT Goal Formulation: With patient Time For Goal Achievement: 05/29/22 Potential to Achieve Goals: Good    Frequency Min 3X/week     Co-evaluation  AM-PAC PT "6 Clicks" Mobility  Outcome Measure Help needed turning from your back to your side while in a flat bed without using bedrails?: A Lot Help needed moving from lying on your back to sitting on the side of a flat bed without using bedrails?: Total Help needed moving to and from a bed to a chair (including a wheelchair)?: Total Help needed standing up from a chair using your arms (e.g., wheelchair or bedside chair)?: Total Help needed to walk in hospital room?: Total Help needed climbing 3-5 steps with a railing? : Total 6 Click Score: 7    End of Session Equipment Utilized During Treatment: Gait belt;Back brace Activity Tolerance:  Patient limited by fatigue Patient left: in bed;with call bell/phone within reach;with bed alarm set   PT Visit Diagnosis: Other abnormalities of gait and mobility (R26.89);Muscle weakness (generalized) (M62.81);Other symptoms and signs involving the nervous system (R29.898);Pain Pain - Right/Left:  (both) Pain - part of body: Leg (and back)    Time: 5809-9833 PT Time Calculation (min) (ACUTE ONLY): 35 min   Charges:   PT Evaluation $PT Eval Moderate Complexity: 1 Mod PT Treatments $Therapeutic Activity: 8-22 mins        Sheran Lawless, PT Acute Rehabilitation Services Office:[phone] 05/15/2022   [name] 05/15/2022, 10:44 AM

## 2022-05-15 NOTE — NC FL2 (Signed)
MEDICAID FL2 LEVEL OF CARE SCREENING TOOL     IDENTIFICATION  Patient Name: Evan Morgan Birthdate: 08/06/1951 Sex: male Admission Date (Current Location): 05/12/2022  [facility] and [place] Number:  Producer, television/film/video and Address:  [facility] [place], [address] [place], [place] [address]      Provider Number: [id number]  Attending Physician Name and Address:  Jodi Geralds, MD  Relative Name and Phone Number:       Current Level of Care: Hospital Recommended Level of Care: Skilled Nursing Facility Prior Approval Number:    Date Approved/Denied:   PASRR Number: [id number]  Discharge Plan: SNF    Current Diagnoses: Patient Active Problem List   Diagnosis Date Noted   Post-op pain 05/13/2022   Intractable low back pain 05/13/2022   Radiculopathy 07/23/2019   Abnormal gait 09/03/2017   Bilateral leg numbness 09/03/2017   Muscle spasm 11/07/2016   BPH (benign prostatic hyperplasia) 08/30/2016   GERD (gastroesophageal reflux disease) 08/30/2016   HTN (hypertension) 08/30/2016   S/P lumbar fusion 08/27/2015   DDD (degenerative disc disease), lumbar 04/01/2015   Foraminal stenosis of lumbar region 04/01/2015   Spinal fusion failure 04/01/2015   Midline low back pain with sciatica 12/16/2014    Orientation RESPIRATION BLADDER Height & Weight     Self, Time, Situation, Place  Normal External catheter, Continent Weight:   Height:     BEHAVIORAL SYMPTOMS/MOOD NEUROLOGICAL BOWEL NUTRITION STATUS      Continent Diet (please see discharge summary)  AMBULATORY STATUS COMMUNICATION OF NEEDS Skin   Extensive Assist   Normal                       Personal Care Assistance Level of Assistance  Bathing, Feeding, Dressing Bathing Assistance: Maximum assistance Feeding assistance: Independent Dressing Assistance: Limited assistance     Functional Limitations Info  Sight, Hearing, Speech Sight Info: Adequate Hearing Info:  Adequate Speech Info: Adequate    SPECIAL CARE FACTORS FREQUENCY  PT (By licensed PT), OT (By licensed OT)     PT Frequency: 5x per week OT Frequency: 5x per week            Contractures Contractures Info: Not present    Additional Factors Info  Code Status, Allergies Code Status Info: FULL Allergies Info: Amlodipine,Codeine,Lisinopril           Current Medications (05/15/2022):  This is the current hospital active medication list Current Facility-Administered Medications  Medication Dose Route Frequency Provider Last Rate Last Admin   acetaminophen (TYLENOL) tablet 325-650 mg  325-650 mg Oral Q6H PRN Marshia Ly, PA-C       bisacodyl (DULCOLAX) EC tablet 5 mg  5 mg Oral Daily PRN Marshia Ly, PA-C       dextrose 5 %-0.45 % sodium chloride infusion   Intravenous Continuous Marshia Ly, PA-C 50 mL/hr at 05/15/22 0600 Infusion Verify at 05/15/22 0600   docusate sodium (COLACE) capsule 100 mg  100 mg Oral BID Marshia Ly, PA-C   100 mg at 05/15/22 7782   HYDROmorphone (DILAUDID) injection 0.5-1 mg  0.5-1 mg Intravenous Q3H PRN Marshia Ly, PA-C       methocarbamol (ROBAXIN) tablet 500 mg  500 mg Oral Q6H PRN Marshia Ly, PA-C   500 mg at 05/14/22 1122   Or   methocarbamol (ROBAXIN) 500 mg in dextrose 5 % 50 mL IVPB  500 mg Intravenous Q6H PRN Marshia Ly, PA-C  ondansetron (ZOFRAN) tablet 4 mg  4 mg Oral Q6H PRN Marshia Ly, PA-C       Or   ondansetron [facility]) injection 4 mg  4 mg Intravenous Q6H PRN Marshia Ly, PA-C       oxyCODONE (Oxy IR/ROXICODONE) immediate release tablet 5-10 mg  5-10 mg Oral Q4H PRN Marshia Ly, PA-C   10 mg at 05/15/22 0917   zolpidem (AMBIEN) tablet 5 mg  5 mg Oral QHS PRN Marshia Ly, PA-C   5 mg at 05/14/22 2127     Discharge Medications: Please see discharge summary for a list of discharge medications.  Relevant Imaging Results:  Relevant Lab Results:   Additional Information SSN  [id number]  Eduard Roux, LCSW

## 2022-05-15 NOTE — Social Work (Signed)
Patient was previously at [facility]/SNF- admitted on 07/18- left voice message for SNF to call to confirmed dates of rehab.  If unable to admit to CIR- patient may be in co-pay status for SNF.  TOC will continue to follow and assist with discharge planning.    Antony Blackbird, MSW, LCSW Clinical Social Worker

## 2022-05-15 NOTE — Evaluation (Signed)
Occupational Therapy Evaluation Patient Details Name: Evan Morgan MRN: [id number] DOB: 01-Apr-1951 Today's Date: 05/15/2022   History of Present Illness 71 year old man admitted on 05/12/22 due to back pain, LE weakness and FTT at home after d/c from SNF to home with aide and his aide quit so presented to [place] then transferred to [facility].  Had been d/c a month ago after stay with severe L 4/5 spinal stenosis. Underwent removal of hardware, decompression L4-5 TLIF, R posterolateral fusion on 04/13/2022. PMH significant for HEP C, HTN, back surgeries x 3.   Clinical Impression   Pt agreeable to bed level care this session. Pt required max (A) to log roll and mod cues to sustain back precautions. Pt agreeable to SNF but does not want to return to [facility]. Pt reports he is interested in [place] facilities and this was shared with [facility] team. Pt asking to be educated on his potential for placement at CIR. Pt educated that the [facility] can discuss placement in detail with him and TOC calling at the end of session. Recommendation SNF due to CIR denial.        Recommendations for follow up therapy are one component of a multi-disciplinary discharge planning process, led by the attending physician.  Recommendations may be updated based on patient status, additional functional criteria and insurance authorization.   Follow Up Recommendations  Skilled nursing-short term rehab (<3 hours/day)    Assistance Recommended at Discharge Set up Supervision/Assistance  Patient can return home with the following A lot of help with walking and/or transfers;A lot of help with bathing/dressing/bathroom    Functional Status Assessment  Patient has had a recent decline in their functional status and demonstrates the ability to make significant improvements in function in a reasonable and predictable amount of time.  Equipment Recommendations  Other (comment) (tba)    Recommendations for Other Services       Precautions /  Restrictions Precautions Precautions: Fall;Back Required Braces or Orthoses: Spinal Brace Spinal Brace: Thoracolumbosacral orthotic;Applied in sitting position Restrictions Weight Bearing Restrictions: No RLE Weight Bearing: Weight bearing as tolerated LLE Weight Bearing: Weight bearing as tolerated      Mobility Bed Mobility Overal bed mobility: Needs Assistance Bed Mobility: Rolling Rolling: Max assist         General bed mobility comments: pt requires max (A) to Roll R and L and pad utilized. pt using BIL UE on bed rails to (A)    Transfers                   General transfer comment: declined      Balance                                           ADL either performed or assessed with clinical judgement   ADL Overall ADL's : Needs assistance/impaired Eating/Feeding: Modified independent;Bed level   Grooming: Modified independent;Bed level   Upper Body Bathing: Moderate assistance   Lower Body Bathing: Maximal assistance   Upper Body Dressing : Moderate assistance   Lower Body Dressing: Maximal assistance                       Vision Baseline Vision/History: 1 Wears glasses       Perception     Praxis      Pertinent Vitals/Pain Pain Assessment Pain Location:  back and legs Pain Descriptors / Indicators: Burning, Numbness     Hand Dominance Right   Extremity/Trunk Assessment Upper Extremity Assessment Upper Extremity Assessment: Generalized weakness   Lower Extremity Assessment Lower Extremity Assessment: Defer to PT evaluation LLE Sensation:  (absent in lower legs/feet, diminished in thighs)   Cervical / Trunk Assessment Cervical / Trunk Assessment: Kyphotic;Back Surgery   Communication Communication Communication: No difficulties   Cognition Arousal/Alertness: Awake/alert Behavior During Therapy: WFL for tasks assessed/performed Overall Cognitive Status: Within Functional Limits for tasks assessed                                        General Comments  prima fit in place, positioned on R side with pillows for decreased pressure    Exercises     Shoulder Instructions      Home Living Family/patient expects to be discharged to:: Private residence Living Arrangements: Alone Available Help at Discharge: Personal care attendant Type of Home: House Home Access: Other (comment) (has a lift to enter home)     Home Layout: One level     Bathroom Shower/Tub: Runner, broadcasting/film/video: Wheelchair - power;Other (comment);BSC/3in1;Rolling Walker (2 wheels) (hoyer lift)   Additional Comments: had aide 12 hours a week then 36 hours just before surgery, thinks aide quit once he got home from rehab due to he did not want that many hours      Prior Functioning/Environment Prior Level of Function : Needs assist             Mobility Comments: transfer to motorized w/c without AD, uses motorized w/c as means of mobility, many falls, uses hoyer lift to get off floor ADLs Comments: assisted for shower transfer, LB bathing and dressing, and toileting        OT Problem List: Decreased strength;Impaired balance (sitting and/or standing);Decreased activity tolerance      OT Treatment/Interventions: Self-care/ADL training;Therapeutic exercise;Neuromuscular education;DME and/or AE instruction;Manual therapy;Modalities;Therapeutic activities;Patient/family education;Balance training    OT Goals(Current goals can be found in the care plan section) Acute Rehab OT Goals Patient Stated Goal: to get therapy at [facility] OT Goal Formulation: With patient Time For Goal Achievement: 05/29/22 Potential to Achieve Goals: Good  OT Frequency: Min 2X/week    Co-evaluation              AM-PAC OT "6 Clicks" Daily Activity     Outcome Measure Help from another person eating meals?: A Little Help from another person taking care of personal grooming?: A  Little Help from another person toileting, which includes using toliet, bedpan, or urinal?: A Lot Help from another person bathing (including washing, rinsing, drying)?: A Lot Help from another person to put on and taking off regular upper body clothing?: A Lot Help from another person to put on and taking off regular lower body clothing?: A Lot 6 Click Score: 14   End of Session Nurse Communication: Mobility status;Precautions  Activity Tolerance: Patient tolerated treatment well Patient left: in bed;with bed alarm set;with call bell/phone within reach  OT Visit Diagnosis: Unsteadiness on feet (R26.81);Muscle weakness (generalized) (M62.81)                Time: 1437-1500 OT Time Calculation (min): 23 min Charges:  OT General Charges $OT Visit: 1 Visit OT Evaluation $OT Eval Moderate Complexity: 1 Mod  Timmothy Euler, OTR/L  Acute Rehabilitation Services Office: [phone] .   Mateo Flow 05/15/2022, 4:12 PM

## 2022-05-15 NOTE — TOC Initial Note (Addendum)
Transition of Care [facility]) - Initial/Assessment Note    Patient Details  Name: Evan Morgan MRN: [id number] Date of Birth: [birth year]  [facility] CM/SW Contact:    Eduard Roux, LCSW Phone Number: 05/15/2022, 3:08 PM  Clinical Narrative:                  CSW spoke with patient by phone. CSW introduced self and explained role. CSW discussed PT/OT evals - patient states he lives home alone. He is agreeable to short term rehab at [facility] but does not want to return to previous SNF. Patient was at [facility] 07/18-08/10, patient maybe in co-pay status, if not covered by medicaid.  Patient inquired about [facility] SNFs, CSW explained [facility] has contracted with local SNFs because they are not offering SNF at this time. CSW explained the SNF process.   TOC will provide bed offers once available TOC will continue to follow and assist with discharge planning.    Antony Blackbird, MSW, LCSW Clinical Social Worker     Expected Discharge Plan: Skilled Nursing Facility Barriers to Discharge: Continued Medical Work up, English as a second language teacher, SNF Pending bed offer   Patient Goals and CMS Choice        Expected Discharge Plan and Services Expected Discharge Plan: Skilled Nursing Facility In-house Referral: Clinical Social Work     Living arrangements for the past 2 months: Skilled Nursing Facility                                      Prior Living Arrangements/Services Living arrangements for the past 2 months: Skilled Nursing Facility Lives with:: Self Patient language and need for interpreter reviewed:: No        Need for Family Participation in Patient Care: No (Comment) Care giver support system in place?: Yes (comment)   Criminal Activity/Legal Involvement Pertinent to Current Situation/Hospitalization: No - Comment as needed  Activities of Daily Living      Permission Sought/Granted Permission sought to share information with : Family Supports Permission granted to  share information with : Yes, Verbal Permission Granted  Share Information with NAME: Abbie Sons  Permission granted to share info w AGENCY: SNFs  Permission granted to share info w Relationship: sister  Permission granted to share info w Contact Information: [phone]  Emotional Assessment       Orientation: : Oriented to Self, Oriented to Place, Oriented to  Time, Oriented to Situation Alcohol / Substance Use: Not Applicable Psych Involvement: No (comment)  Admission diagnosis:  Post-op pain [G89.18] Intractable low back pain [M54.59] Patient Active Problem List   Diagnosis Date Noted   Post-op pain 05/13/2022   Intractable low back pain 05/13/2022   Radiculopathy 07/23/2019   Abnormal gait 09/03/2017   Bilateral leg numbness 09/03/2017   Muscle spasm 11/07/2016   BPH (benign prostatic hyperplasia) 08/30/2016   GERD (gastroesophageal reflux disease) 08/30/2016   HTN (hypertension) 08/30/2016   S/P lumbar fusion 08/27/2015   DDD (degenerative disc disease), lumbar 04/01/2015   Foraminal stenosis of lumbar region 04/01/2015   Spinal fusion failure 04/01/2015   Midline low back pain with sciatica 12/16/2014   PCP:  Sherwood Gambler, MD Pharmacy:   [facility] - [place], [place] - [address] [place] [address] Phone: [phone] Fax: [phone]  [id number] - [place], [place] - [address] AT  [facility] OF [address] [place] [address] Phone: [phone] Fax: [phone]     Social Determinants of Health (SDOH) Interventions    Readmission Risk Interventions     No data to display

## 2022-05-16 MED ORDER — DEXAMETHASONE SODIUM PHOSPHATE 4 MG/ML IJ SOLN
4.0000 mg | Freq: Two times a day (BID) | INTRAMUSCULAR | Status: AC
  Administered 2022-05-16 – 2022-05-17 (×4): 4 mg via INTRAVENOUS
  Filled 2022-05-16 (×4): qty 1

## 2022-05-16 MED ORDER — HYDROCODONE-ACETAMINOPHEN 5-325 MG PO TABS
1.0000 | ORAL_TABLET | ORAL | Status: DC | PRN
  Administered 2022-05-16 – 2022-05-19 (×7): 2 via ORAL
  Filled 2022-05-16 (×7): qty 2

## 2022-05-16 MED ORDER — OXYCODONE HCL 5 MG PO TABS
2.5000 mg | ORAL_TABLET | ORAL | Status: DC | PRN
  Administered 2022-05-18: 5 mg via ORAL
  Filled 2022-05-16: qty 1

## 2022-05-16 NOTE — Care Management Obs Status (Signed)
MEDICARE OBSERVATION STATUS NOTIFICATION   Patient Details  Name: Evan Morgan MRN: [id number] Date of Birth: 1951/08/11   Medicare Observation Status Notification Given:  Yes    Beckie Busing, RN 05/16/2022, 4:10 PM

## 2022-05-16 NOTE — TOC Progression Note (Signed)
Transition of Care [facility]) - Progression Note    Patient Details  Name: Evan Morgan MRN: [id number] Date of Birth: November 16, 1950  Transition of Care [facility]) CM/SW [place] Phone Number: 05/16/2022, 2:31 PM  Clinical Narrative:     CSW met with patient at bedside. Patient requesting to use VA benefits in hopes to secure placement at [facility] facility.   CSW spoke with [facility] coordinator April- she confirmed patient is service connected at 90%. His PCP - Dr. Rondell Reams, [facility]  Social Worker  S. Ernestina Columbia [phone], ext [phone]. CSW left voice message with VA SW.   CSW sent referral for [facility] SNF approval  TOC will continue to follow and assist with discharge planning.  Thurmond Butts, MSW, LCSW Clinical Social Worker    Expected Discharge Plan: Skilled Nursing Facility Barriers to Discharge: Continued Medical Work up, Ship broker, SNF Pending bed offer  Expected Discharge Plan and Services Expected Discharge Plan: [place] In-house Referral: Clinical Social Work     Living arrangements for the past 2 months: [place]                                       Social Determinants of Health (SDOH) Interventions    Readmission Risk Interventions     No data to display

## 2022-05-16 NOTE — Progress Notes (Signed)
    Patient reports feeling a bit better today.  He feels his strength is mildly improved.  He has been getting occupational and physical therapy and is feeling better with this.  Social work is working on placing him at a Garment/textile technologist facility through the [place] in [place].  He is comfortable with this and feels he will get good care there.  He did not qualify for inpatient rehab because of his benefits.  His pain is well controlled.   Physical Exam: Vitals:   05/16/22 0750 05/16/22 1201  BP: 114/83 117/78  Pulse: 70 78  Resp: 15 18  Temp: 98.4 F (36.9 C) 97.8 F (36.6 C)  SpO2: 97% 100%    Incision is well-healed and intact.  Minimal tenderness to direct palpation.  No erythema or drainage.  No sign of infection.  He is laying comfortably in his hospital bed with TLSO brace at bedside NVI  POD #31 s/p L4-L5 decompression and fusion with utilization of BMP for severe spinal stenosis and weakness on 04/13/2022.  Current functional status and weakness appears consistent with patient's baseline preoperatively.   Patient historically discharged from the hospital to skilled nursing facility for rehabilitation, however, patient had a very poor experience at this facility and went home with a full time home health aide.  Extremely unfortunately, the aid quit after several hours stating he did not wish to work the long hours and the patient was left alone at home unable to care for himself resulting in his return to the hospital. Patient appears to have stable lower extremity weakness compared to preoperative testing requiring rehabilitation to progress his independence.   - up with PT/OT, encourage ambulation.  Patient would likely benefit from PT/OT two times a day while in the hospital - pain very well controlled, continue current medications as needed - expected postoperative findings on MRI and CT scan.  No additional surgical intervention is required.  This is currently a social and rehab  placement situation - pending evaluation by social work to determine discharge options.  Patient clearly requires extensive physical therapy and rehabilitation and is unable to proceed home safely on his own.  The patient is a veteran and has VA benefits  - up with PT/OT, encourage ambulation - Percocet for pain, Robaxin for muscle spasms  -Consider transitioning to hydrocodone as the patient does appear mildly sedated upon examination today. - DC to skilled nursing facility when available.

## 2022-05-16 NOTE — TOC Progression Note (Signed)
Transition of Care [facility]) - Progression Note    Patient Details  Name: ABDULRAHEEM PINEO MRN: [id number] Date of Birth: 06-26-1951  Transition of Care [facility]) CM/SW Contact  Eduard Roux, [place] Phone Number: 05/16/2022, 4:01 PM  Clinical Narrative:     CSW- faxed clinicals to VA for review for short term rehab approval- CSW waiting on determination.  Antony Blackbird, MSW, LCSW Clinical Social Worker    Expected Discharge Plan: Skilled Nursing Facility Barriers to Discharge: Continued Medical Work up, English as a second language teacher, SNF Pending bed offer  Expected Discharge Plan and Services Expected Discharge Plan: Skilled Nursing Facility In-house Referral: Clinical Social Work     Living arrangements for the past 2 months: Skilled Nursing Facility                                       Social Determinants of Health (SDOH) Interventions    Readmission Risk Interventions     No data to display

## 2022-05-16 NOTE — Progress Notes (Signed)
Occupational Therapy Treatment Patient Details Name: Evan Morgan MRN: [id number] DOB: 1951/06/04 Today's Date: 05/16/2022   History of present illness 71 year old man admitted on 05/12/22 due to back pain, LE weakness and FTT at home after d/c from SNF to home with aide and his aide quit so presented to [place] then transferred to [facility]  Had been d/c a month ago after stay with severe L 4/5 spinal stenosis. Underwent removal of hardware, decompression L4-5 TLIF, R posterolateral fusion on 04/13/2022. PMH significant for HEP C, HTN, back surgeries x 3.   OT comments  Mr. Fahl was seen today to focus on bed mobility, sit<>stand with sara stedy and simulated bed>tBSC transfers with going be(raised) to recliner.Pt was able to achieve almost fully upright with sara stedy (one hand on front bar and one hand on flipped up seat)--but unable to transfer hand on seat to front bar and unable to pull up with both hands on front bar even with bed raised really high. He did this x3, then a second person came in to help and he was able to get up enough and transfer hand to front bar from seat with Max A+2. He will continue to benefit from acute OT with follow up at SNF.   Recommendations for follow up therapy are one component of a multi-disciplinary discharge planning process, led by the attending physician.  Recommendations may be updated based on patient status, additional functional criteria and insurance authorization.    Follow Up Recommendations  Skilled nursing-short term rehab (<3 hours/day)       Patient can return home with the following  Two people to help with walking and/or transfers;A lot of help with bathing/dressing/bathroom;Assistance with cooking/housework;Help with stairs or ramp for entrance;Assist for transportation   Equipment Recommendations  Other (comment) (TBD next venue)       Precautions / Restrictions Precautions Precautions: Fall;Back Required Braces or Orthoses: Spinal  Brace Spinal Brace: Thoracolumbosacral orthotic;Applied in sitting position Restrictions Weight Bearing Restrictions: No       Mobility Bed Mobility Overal bed mobility: Needs Assistance Bed Mobility: Rolling, Sidelying to Sit Rolling: Min assist Sidelying to sit: Min assist, HOB elevated            Transfers Overall transfer level: Needs assistance Equipment used: Ambulation equipment used Transfers: Sit to/from Stand Sit to Stand: Max assist, From elevated surface, +2 physical assistance           General transfer comment: sara stedy     Balance Overall balance assessment: Needs assistance Sitting-balance support: Bilateral upper extremity supported, Feet supported Sitting balance-Leahy Scale: Poor     Standing balance support: Reliant on assistive device for balance, Bilateral upper extremity supported Standing balance-Leahy Scale: Poor                             ADL either performed or assessed with clinical judgement   ADL Overall ADL's : Needs assistance/impaired                         Toilet Transfer: Maximal assistance;+2 for physical assistance Toilet Transfer Details (indicate cue type and reason): use of sara stedy from raised be to recliner                Extremity/Trunk Assessment Upper Extremity Assessment Upper Extremity Assessment: Generalized weakness            Vision Baseline Vision/History: 1  Wears glasses Patient Visual Report: No change from baseline            Cognition Arousal/Alertness: Awake/alert Behavior During Therapy: WFL for tasks assessed/performed Overall Cognitive Status: Within Functional Limits for tasks assessed                                                     Pertinent Vitals/ Pain       Pain Assessment Pain Assessment: 0-10 Pain Score: 8  Pain Location: back with movement Pain Descriptors / Indicators: Burning, Sore, Aching Pain Intervention(s):  Limited activity within patient's tolerance, Monitored during session, Premedicated before session, Repositioned (back brace)         Frequency  Min 2X/week        Progress Toward Goals  OT Goals(current goals can now be found in the care plan section)  Progress towards OT goals: Progressing toward goals  Acute Rehab OT Goals Patient Stated Goal: to get more therapy OT Goal Formulation: With patient Time For Goal Achievement: 05/29/22 Potential to Achieve Goals: Good  Plan Discharge plan remains appropriate       AM-PAC OT "6 Clicks" Daily Activity     Outcome Measure   Help from another person eating meals?: None Help from another person taking care of personal grooming?: A Little Help from another person toileting, which includes using toliet, bedpan, or urinal?: Total Help from another person bathing (including washing, rinsing, drying)?: A Lot Help from another person to put on and taking off regular upper body clothing?: A Lot Help from another person to put on and taking off regular lower body clothing?: Total 6 Click Score: 13    End of Session Equipment Utilized During Treatment: Gait belt;Back brace  OT Visit Diagnosis: Unsteadiness on feet (R26.81);Muscle weakness (generalized) (M62.81)   Activity Tolerance Patient tolerated treatment well   Patient Left in chair;with call bell/phone within reach;with chair alarm set   Nurse Communication Mobility status        Time: 0093-8182 OT Time Calculation (min): 33 min  Charges: OT General Charges $OT Visit: 1 Visit OT Treatments $Self Care/Home Management : 23-37 mins  Ignacia Palma, OTR/L Acute Rehab Services [facility] [phone] Office [phone]    Evette Georges 05/16/2022, 11:50 AM

## 2022-05-16 NOTE — Care Management Important Message (Signed)
Important Message  Patient Details  Name: Evan Morgan MRN: [id number] Date of Birth: [birth year]   Medicare Important Message Given:  Yes     Sherilyn Banker 05/16/2022, 12:02 PM

## 2022-05-16 NOTE — Progress Notes (Signed)
Physical Therapy Treatment Patient Details Name: Evan Morgan MRN: [id number] DOB: [birth year] Today's Date: 05/16/2022   History of Present Illness 71 year old man admitted on 05/12/22 due to back pain, LE weakness and FTT at home after d/c from SNF to home with aide and his aide quit so presented to [place] then transferred to [facility].  Had been d/c a month ago after stay with severe L 4/5 spinal stenosis. Underwent removal of hardware, decompression L4-5 TLIF, R posterolateral fusion on 04/13/2022. PMH significant for HEP C, HTN, back surgeries x 3.    PT Comments    Patient fatigued with multiple sit to partial stands for hygiene after toileting and bathing on BSC.  He was able to use the [facility] with +2 A for lifting up to perch on [place] seat.  He was also able to return to supine with +1 mod A for lifting LE's.  Noted no coverage still for AIR and pt willing to go to a different SNF.  PT will continue to follow acutely.    Recommendations for follow up therapy are one component of a multi-disciplinary discharge planning process, led by the attending physician.  Recommendations may be updated based on patient status, additional functional criteria and insurance authorization.  Follow Up Recommendations  Skilled nursing-short term rehab (<3 hours/day) Can patient physically be transported by private vehicle: No   Assistance Recommended at Discharge Frequent or constant Supervision/Assistance  Patient can return home with the following Two people to help with walking and/or transfers;A lot of help with bathing/dressing/bathroom;Assistance with cooking/housework;Assist for transportation   Equipment Recommendations  Other (comment) (TBA)    Recommendations for Other Services       Precautions / Restrictions Precautions Precautions: Fall;Back Required Braces or Orthoses: Spinal Brace Spinal Brace: Thoracolumbosacral orthotic;Applied in sitting position Restrictions Weight Bearing Restrictions: No      Mobility  Bed Mobility Overal bed mobility: Needs Assistance           Sit to sidelying: Mod assist General bed mobility comments: able to return to side with assist for legs, and positioning once rolled to supine    Transfers Overall transfer level: Needs assistance Equipment used: Ambulation equipment used Transfers: Sit to/from Stand, Bed to chair/wheelchair/BSC Sit to Stand: Max assist, From elevated surface, +2 physical assistance           General transfer comment: patient on [facility] upon entry with tech assisting with bath, assisted to stand x 2 to partial stand for hygiene after toileting.  Able to extend legs/trunk enough with cues to get flaps down on Corene Cornea.  Patient stood twice from EOB due to soiling pad under him. Transfer via Lift Equipment: Stedy  Ambulation/Gait               General Gait Details: unable   Social research officer, government Rank[place] (Stroke Patients Only)       Balance Overall balance assessment: Needs assistance Sitting-balance support: Bilateral upper extremity supported Sitting balance-Leahy Scale: Poor Sitting balance - Comments: needs UE support and cues due to posterior bias, sacral sitting and rounded shoulders Postural control: Posterior lean Standing balance support: Bilateral upper extremity supported Standing balance-Leahy Scale: Zero Standing balance comment: +2 for standing attempts                            Cognition Arousal/Alertness: Awake/alert Behavior  During Therapy: WFL for tasks assessed/performed Overall Cognitive Status: Within Functional Limits for tasks assessed                                          Exercises      General Comments General comments (skin integrity, edema, etc.): incontinent of bowel once up in recliner with OT this am.  NT had assisted to nurse up with Stedy to [facility].      Pertinent Vitals/Pain Pain Assessment Pain  Assessment: Faces Faces Pain Scale: Hurts even more Pain Location: back with movement Pain Descriptors / Indicators: Burning, Sore, Aching Pain Intervention(s): Monitored during session, Repositioned, Limited activity within patient's tolerance    Home Living                          Prior Function            PT Goals (current goals can now be found in the care plan section) Progress towards PT goals: Progressing toward goals    Frequency    Min 3X/week      PT Plan Discharge plan needs to be updated    Co-evaluation              AM-PAC PT "6 Clicks" Mobility   Outcome Measure  Help needed turning from your back to your side while in a flat bed without using bedrails?: A Lot Help needed moving from lying on your back to sitting on the side of a flat bed without using bedrails?: Total Help needed moving to and from a bed to a chair (including a wheelchair)?: Total Help needed standing up from a chair using your arms (e.g., wheelchair or bedside chair)?: Total Help needed to walk in hospital room?: Total Help needed climbing 3-5 steps with a railing? : Total 6 Click Score: 7    End of Session Equipment Utilized During Treatment: Gait belt;Back brace Activity Tolerance: Patient limited by fatigue Patient left: in bed;with call bell/phone within reach;with bed alarm set   PT Visit Diagnosis: Other abnormalities of gait and mobility (R26.89);Muscle weakness (generalized) (M62.81);Other symptoms and signs involving the nervous system (R29.898);Pain Pain - part of body:  (back)     Time: 1035-1100 PT Time Calculation (min) (ACUTE ONLY): 25 min  Charges:  $Therapeutic Activity: 23-37 mins                     Sheran Lawless, PT Acute Rehabilitation Services [phone] 05/16/2022    Evan Morgan 05/16/2022, 12:02 PM

## 2022-05-16 NOTE — Care Management CC44 (Signed)
Condition Code 44 Documentation Completed  Patient Details  Name: ELIYA GEIMAN MRN: [id number] Date of Birth: 26-Mar-1951   Condition Code 44 given:  Yes Patient signature on Condition Code 44 notice:  Yes Documentation of 2 MD's agreement:  Yes Code 44 added to claim:  Yes    Beckie Busing, RN 05/16/2022, 4:10 PM

## 2022-05-17 NOTE — TOC Progression Note (Signed)
Transition of Care [facility]) - Progression Note    Patient Details  Name: Evan Morgan MRN: [id number] Date of Birth: 1951/04/19  Transition of Care [facility]) CM/SW Contact  Eduard Roux, [place] Phone Number: 05/17/2022, 4:18 PM  Clinical Narrative:     TOC is still awaiting determination from [facility] for approval for SNF.   Expected Discharge Plan: Skilled Nursing Facility Barriers to Discharge: Continued Medical Work up, English as a second language teacher, SNF Pending bed offer  Expected Discharge Plan and Services Expected Discharge Plan: Skilled Nursing Facility In-house Referral: Clinical Social Work     Living arrangements for the past 2 months: Skilled Nursing Facility                                       Social Determinants of Health (SDOH) Interventions    Readmission Risk Interventions     No data to display

## 2022-05-17 NOTE — Progress Notes (Signed)
    Patient doing well  Pain is minimal Has not ambulated yet today   Physical Exam: Vitals:   05/17/22 0752 05/17/22 1105  BP: 135/87 (!) 142/82  Pulse: 69 67  Resp: 15 15  Temp: 98.4 F (36.9 C) 97.6 F (36.4 C)  SpO2: 99% 97%    Patient is lying supine comfortably Strength is at baseline  Pt s/p lumbar decompression and fusion, readmitted secondary to his home health aide quitting.  Functionally, he is at his baseline, but does need additional therapy, and ultimately, discharged home with a home health therapist and aide.  The patient's postoperative MRI is very typical for him being 4 weeks out from surgery, and does reveal postoperative seroma formation, very typical for what one would expect 4 weeks after surgery.  This will certainly improve in time.  - up with PT/OT, encourage ambulation - patient may be transferred to a skilled nursing facility as soon as a bed is available.  It is my understanding that this is currently being worked on.  He will need ongoing home health physical therapy and likely, a home health aide upon discharge home.

## 2022-05-18 NOTE — Progress Notes (Signed)
Physical Therapy Treatment Patient Details Name: Evan Morgan MRN: [id number] DOB: Feb 13, 1951 Today's Date: 05/18/2022   History of Present Illness 71 year old man admitted on 05/12/22 due to back pain, LE weakness and FTT at home after d/c from SNF to home with aide and his aide quit so presented to [place] then transferred to [facility].  Had been d/c a month ago after stay with severe L 4/5 spinal stenosis. Underwent removal of hardware, decompression L4-5 TLIF, R posterolateral fusion on 04/13/2022. PMH significant for HEP C, HTN, back surgeries x 3.    PT Comments    Patient progressing with LE strength able to stand more erect in stedy and for longer time (up to 30 sec) and transferred sit to stand and supine to sit with much less assistance.  Tolerated LE stretching well and able to activate LE's while in supine for stabilized bridging.  PT will continue to follow acutely.   Continue to recommend SNF level rehab at  d/c.    Recommendations for follow up therapy are one component of a multi-disciplinary discharge planning process, led by the attending physician.  Recommendations may be updated based on patient status, additional functional criteria and insurance authorization.  Follow Up Recommendations  Skilled nursing-short term rehab (<3 hours/day)     Assistance Recommended at Discharge Frequent or constant Supervision/Assistance  Patient can return home with the following A lot of help with bathing/dressing/bathroom;A lot of help with walking and/or transfers;Assistance with cooking/housework;Assist for transportation   Equipment Recommendations  Other (comment) (TBA)    Recommendations for Other Services       Precautions / Restrictions Precautions Precautions: Fall;Back Required Braces or Orthoses: Spinal Brace Spinal Brace: Thoracolumbosacral orthotic;Applied in sitting position     Mobility  Bed Mobility Overal bed mobility: Needs Assistance Bed Mobility: Supine to Sit      Supine to sit: HOB elevated, Min assist     General bed mobility comments: using his own method to sit up with heavy rail support and min A for L LE off bed    Transfers Overall transfer level: Needs assistance Equipment used:  Antony Salmon) Transfers: Sit to/from Stand, Bed to chair/wheelchair/BSC Sit to Stand: From elevated surface, Min assist           General transfer comment: sit<>stand x 4 to [facility] and cues with increased time needed for full upright posture; stood 30 sec x 2 then 20 sec and 10 sec.  Transferred to chair via Orthoptist: Stedy  Ambulation/Gait                   Stairs             Wheelchair Mobility    Modified Rankin (Stroke Patients Only)       Balance Overall balance assessment: Needs assistance   Sitting balance-Leahy Scale: Poor Sitting balance - Comments: UE support and posterior bias, work on upright posture on EOB holding upright x 10 sec x 4 reps     Standing balance-Leahy Scale: Poor Standing balance comment: stood at [employer] with min A for hip extension x 4 reps up to 30 sec                            Cognition Arousal/Alertness: Awake/alert Behavior During Therapy: WFL for tasks assessed/performed Overall Cognitive Status: Within Functional Limits for tasks assessed  Exercises Other Exercises Other Exercises: In supine lateral trunk rotation AAROM x 10 Other Exercises: stretching to hamstrings, gluts, hip rotators with 20 sec hold Other Exercises: supported bridging x 10 w/ 5 sec hold    General Comments General comments (skin integrity, edema, etc.): set up dinner tray and NT informed pt wishing to wash up while in chair      Pertinent Vitals/Pain Pain Assessment Faces Pain Scale: Hurts little more Pain Location: back with movement Pain Descriptors / Indicators: Burning, Tingling Pain Intervention(s): Monitored  during session    Home Living                          Prior Function            PT Goals (current goals can now be found in the care plan section) Progress towards PT goals: Progressing toward goals    Frequency    Min 3X/week      PT Plan Current plan remains appropriate    Co-evaluation              AM-PAC PT "6 Clicks" Mobility   Outcome Measure  Help needed turning from your back to your side while in a flat bed without using bedrails?: A Lot Help needed moving from lying on your back to sitting on the side of a flat bed without using bedrails?: A Lot Help needed moving to and from a bed to a chair (including a wheelchair)?: A Lot Help needed standing up from a chair using your arms (e.g., wheelchair or bedside chair)?: A Lot Help needed to walk in hospital room?: Total Help needed climbing 3-5 steps with a railing? : Total 6 Click Score: 10    End of Session Equipment Utilized During Treatment: Gait belt;Back brace Activity Tolerance: Patient tolerated treatment well Patient left: in chair;with call bell/phone within reach Nurse Communication: Mobility status (wants to wash up) PT Visit Diagnosis: Other abnormalities of gait and mobility (R26.89);Muscle weakness (generalized) (M62.81);Other symptoms and signs involving the nervous system (R29.898)     Time: 4196-2229 PT Time Calculation (min) (ACUTE ONLY): 31 min  Charges:  $Therapeutic Exercise: 8-22 mins $Therapeutic Activity: 8-22 mins                     Sheran Lawless, PT Acute Rehabilitation Services Office:[phone] 05/18/2022    [name] 05/18/2022, 5:55 PM

## 2022-05-18 NOTE — TOC Progression Note (Signed)
Transition of Care [facility]) - Progression Note    Patient Details  Name: Evan Morgan MRN: [id number] Date of Birth: April 27, 1951  Transition of Care [facility]) CM/SW [place], [place] Phone Number: 05/18/2022, 10:30 AM  Clinical Narrative:     CSW met with patient at bedside. CSW informed has not received determination from [place] regarding SNF approval. Patient was agreeable if no determination from the  [place] by 12noon today, we can move forward w/ SNF choice of [facility], using Medicare benefits as back up plan.   TOC will continue to follow and assist with discharge planning.  Thurmond Butts, MSW, LCSW Clinical Social Worker    Expected Discharge Plan: Skilled Nursing Facility Barriers to Discharge: Continued Medical Work up, Ship broker, SNF Pending bed offer  Expected Discharge Plan and Services Expected Discharge Plan: [place] In-house Referral: Clinical Social Work     Living arrangements for the past 2 months: [place]                                       Social Determinants of Health (SDOH) Interventions    Readmission Risk Interventions     No data to display

## 2022-05-18 NOTE — Progress Notes (Signed)
Patient evaluated at his bedside.  No change in his history or physical examination.  He continues to await transfer to a SNF.  He continues to deny pain.  Unfortunately, the patient states that he did not obtain any physical therapy yesterday.  BP (!) 153/99 (BP Location: Right Arm)   Pulse 64   Temp 97.7 F (36.5 C) (Axillary)   Resp 14   SpO2 96%   It is unfortunate that the patient did not obtain any physical therapy yesterday.  Ongoing physical therapy is imperative for his recovery.  The plan today is unchanged.  He is simply awaiting transfer to a skilled nursing facility.  He will follow-up in the office in 2 weeks.

## 2022-05-18 NOTE — Plan of Care (Signed)

## 2022-05-18 NOTE — TOC Progression Note (Signed)
Transition of Care [facility]) - Progression Note    Patient Details  Name: MITCHAEL LUCKEY MRN: [id number] Date of Birth: 1951/08/30  Transition of Care [facility]) CM/SW Contact  Eduard R[place], [place] Phone Number: 05/18/2022, 12:30 PM  Clinical Narrative:     CSW received call from [place] SW [place]- she requested CSW re-fax clinicals to [phone], it is possible they can review today and make determination. VA SW states she has spoken with the patient and informed he may not get approval since he has Medicare coverage and VA rehab may not have availability.   CSW re-faxed clinicals as requested   TOC continues to follow and assist with discharge planning.  Antony Blackbird, MSW, LCSW Clinical Social Worker    Expected Discharge Plan: Skilled Nursing Facility Barriers to Discharge: Continued Medical Work up, English as a second language teacher, SNF Pending bed offer  Expected Discharge Plan and Services Expected Discharge Plan: Skilled Nursing Facility In-house Referral: Clinical Social Work     Living arrangements for the past 2 months: Skilled Nursing Facility                                       Social Determinants of Health (SDOH) Interventions    Readmission Risk Interventions     No data to display

## 2022-05-19 MED ORDER — OXYCODONE-ACETAMINOPHEN 5-325 MG PO TABS
1.0000 | ORAL_TABLET | Freq: Four times a day (QID) | ORAL | Status: DC | PRN
  Administered 2022-05-19 – 2022-05-24 (×12): 2 via ORAL
  Administered 2022-05-25: 1 via ORAL
  Administered 2022-05-25: 2 via ORAL
  Administered 2022-05-25 (×2): 1 via ORAL
  Administered 2022-05-25 – 2022-05-26 (×3): 2 via ORAL
  Filled 2022-05-19 (×17): qty 2

## 2022-05-19 MED ORDER — METHOCARBAMOL 500 MG PO TABS
500.0000 mg | ORAL_TABLET | Freq: Four times a day (QID) | ORAL | 2 refills | Status: DC | PRN
Start: 2022-05-19 — End: 2022-05-24

## 2022-05-19 MED ORDER — ONDANSETRON HCL 4 MG PO TABS: 4.0000 mg | ORAL_TABLET | Freq: Four times a day (QID) | ORAL | 0 refills | Status: AC | PRN

## 2022-05-19 MED ORDER — HYDROCODONE-ACETAMINOPHEN 5-325 MG PO TABS: 1.0000 | ORAL_TABLET | ORAL | 0 refills | Status: DC | PRN

## 2022-05-19 MED ORDER — OXYCODONE-ACETAMINOPHEN 5-325 MG PO TABS: 1.0000 | ORAL_TABLET | Freq: Four times a day (QID) | ORAL | 0 refills | Status: DC | PRN

## 2022-05-19 NOTE — TOC Progression Note (Addendum)
Transition of Care [facility]) - Progression Note    Patient Details  Name: Evan Morgan MRN: [id number] Date of Birth: [birth year]  Transition of Care [facility]) CM/SW Contact  Eduard Roux, [place] Phone Number: 05/19/2022, 2:09 PM  Clinical Narrative:     CSW received call from the [place] SW [facility] - patient has been approved for SNF 30 days [employer]-    TOC will still need to  [place] contacted facility and get final approval TOC will continue to follow and assist with discharge planning  TOC anticipate with the steps involved/required to  secure SNF placement through VA benefits- patient may be here through the beginning of next week. TOC & Unit Leadership updated.  Antony Blackbird, MSW, LCSW Clinical Social Worker     Expected Discharge Plan: Skilled Nursing Facility Barriers to Discharge: Continued Medical Work up, English as a second language teacher, SNF Pending bed offer  Expected Discharge Plan and Services Expected Discharge Plan: Skilled Nursing Facility In-house Referral: Clinical Social Work     Living arrangements for the past 2 months: Skilled Nursing Facility Expected Discharge Date: 05/19/22                                     Social Determinants of Health (SDOH) Interventions    Readmission Risk Interventions     No data to display

## 2022-05-19 NOTE — TOC Progression Note (Signed)
Transition of Care [facility]) - Progression Note    Patient Details  Name: Evan Morgan MRN: [id number] Date of Birth: 05/31/1951  Transition of Care [facility]) CM/SW Contact  Eduard Roux, [place] Phone Number: 05/19/2022, 3:26 PM  Clinical Narrative:     Sent SNF referrals to- [facility]- waiting on response   Calls made to: [facility] [phone]- they do not accept [facility] [phone]- [facility] [place] [phone]- [facility] [place] [facility] [place]- [facility] [place] [facility] [phone] or [phone] [facility] [place]) [place] [phone]- they will review to see if they can offer  TOC will continue to follow and assist with discharge planning.  Antony Blackbird, MSW, LCSW Clinical Social Worker      Expected Discharge Plan: Skilled Nursing Facility Barriers to Discharge: Continued Medical Work up, English as a second language teacher, SNF Pending bed offer  Expected Discharge Plan and Services Expected Discharge Plan: Skilled Nursing Facility In-house Referral: Clinical Social Work     Living arrangements for the past 2 months: Skilled Nursing Facility Expected Discharge Date: 05/19/22                                     Social Determinants of Health (SDOH) Interventions    Readmission Risk Interventions     No data to display

## 2022-05-19 NOTE — Progress Notes (Signed)
Physical Therapy Treatment Patient Details Name: Evan Morgan MRN: [id number] DOB: May 05, 1951 Today's Date: 05/19/2022   History of Present Illness 71 year old man admitted on 05/12/22 due to back pain, LE weakness and FTT at home after d/c from SNF to home with aide and his aide quit so presented to [place] then transferred to [facility].  Had been d/c a month ago after stay with severe L 4/5 spinal stenosis. Underwent removal of hardware, decompression L4-5 TLIF, R posterolateral fusion on 04/13/2022. PMH significant for HEP C, HTN, back surgeries x 3.    PT Comments    Patient progressing, able to stand with RW and march in place with heavy UE support and visual feedback for foot positioning and ataxia noted with difficulty grading movements, difficulty with placement.  Continue to feel he will benefit from STSNF level rehab prior to d/c home with assist.  Will continue to follow acutely.    Recommendations for follow up therapy are one component of a multi-disciplinary discharge planning process, led by the attending physician.  Recommendations may be updated based on patient status, additional functional criteria and insurance authorization.  Follow Up Recommendations  Skilled nursing-short term rehab (<3 hours/day) Can patient physically be transported by private vehicle: No   Assistance Recommended at Discharge Frequent or constant Supervision/Assistance  Patient can return home with the following A lot of help with bathing/dressing/bathroom;A lot of help with walking and/or transfers;Assistance with cooking/housework;Assist for transportation   Equipment Recommendations  Other (comment) (TBA)    Recommendations for Other Services       Precautions / Restrictions Precautions Precautions: Fall;Back Required Braces or Orthoses: Spinal Brace Spinal Brace: Thoracolumbosacral orthotic;Applied in sitting position     Mobility  Bed Mobility Overal bed mobility: Needs Assistance Bed Mobility:  Sit to Sidelying         Sit to sidelying: Mod assist General bed mobility comments: assist for legs into bed    Transfers Overall transfer level: Needs assistance Equipment used: Rolling walker (2 wheels) (versus stedy) Transfers: Sit to/from Stand, Bed to chair/wheelchair/BSC Sit to Stand: From elevated surface, Min assist           General transfer comment: up from recliner to stedy pt pulling up with minguard for balance; assisted to EOB.  Stood from EOB x 2 to RW and min A. Transfer via Lift Equipment: Stedy  Ambulation/Gait Ambulation/Gait assistance: Mod assist Gait Distance (Feet): 0 Feet Assistive device: Rolling walker (2 wheels) Gait Pattern/deviations: Ataxic       General Gait Details: standing in place marching  several steps with pt visualizing feet and ataxic movements noted.   Stairs             Wheelchair Mobility    Modified Rankin (Stroke Patients Only)       Balance Overall balance assessment: Needs assistance   Sitting balance-Leahy Scale: Poor Sitting balance - Comments: UE support and rounded back posture on EOB   Standing balance support: Bilateral upper extremity supported Standing balance-Leahy Scale: Poor                              Cognition Arousal/Alertness: Awake/alert Behavior During Therapy: WFL for tasks assessed/performed Overall Cognitive Status: Within Functional Limits for tasks assessed  Exercises General Exercises - Lower Extremity Ankle Circles/Pumps: AROM, Both, 10 reps, Seated Heel Slides: AROM, AAROM, Both, 5 reps, Seated Other Exercises Other Exercises: stretching to hamstrings, gluts, hip rotators with 20 sec hold    General Comments        Pertinent Vitals/Pain Pain Assessment Faces Pain Scale: Hurts whole lot Pain Location: back after sitting Pain Descriptors / Indicators: Burning, Aching Pain Intervention(s): Monitored  during session, RN gave pain meds during session, Repositioned    Home Living                          Prior Function            PT Goals (current goals can now be found in the care plan section) Progress towards PT goals: Progressing toward goals    Frequency    Min 3X/week      PT Plan Current plan remains appropriate    Co-evaluation              AM-PAC PT "6 Clicks" Mobility   Outcome Measure  Help needed turning from your back to your side while in a flat bed without using bedrails?: A Lot Help needed moving from lying on your back to sitting on the side of a flat bed without using bedrails?: A Lot Help needed moving to and from a bed to a chair (including a wheelchair)?: A Lot Help needed standing up from a chair using your arms (e.g., wheelchair or bedside chair)?: A Lot Help needed to walk in hospital room?: Total Help needed climbing 3-5 steps with a railing? : Total 6 Click Score: 10    End of Session Equipment Utilized During Treatment: Gait belt;Back brace Activity Tolerance: Patient tolerated treatment well Patient left: in bed;with call bell/phone within reach;with bed alarm set   PT Visit Diagnosis: Other abnormalities of gait and mobility (R26.89);Muscle weakness (generalized) (M62.81);Other symptoms and signs involving the nervous system (R29.898);Pain Pain - part of body:  (back)     Time: 1540-1610 PT Time Calculation (min) (ACUTE ONLY): 30 min  Charges:  $Therapeutic Exercise: 8-22 mins $Therapeutic Activity: 8-22 mins                     Sheran Lawless, PT Acute Rehabilitation Services [phone] 05/19/2022    [name] 05/19/2022, 6:14 PM

## 2022-05-19 NOTE — Progress Notes (Signed)
     Patient reports noticeable improved strength last 2 days.  Pain well controlled.  He has been getting occupational and physical therapy and is feeling better with this.  Social work is working on placing him at a Garment/textile technologist facility through the [employer] at a different facility.  He did not qualify for inpatient rehab because of his benefits.      Physical Exam: BP 126/78 (BP Location: Right Arm)   Pulse (!) 55   Temp 97.7 F (36.5 C) (Axillary)   Resp 16   SpO2 99%     Incision is well-healed and intact.  Minimal tenderness to direct palpation.  No erythema or drainage.  No sign of infection.  He is laying comfortably in his hospital bed with TLSO brace at bedside NVI   POD #34 s/p L4-L5 decompression and fusion with utilization of BMP for severe spinal stenosis and weakness on 04/13/2022.  Current functional status and weakness appears consistent with patient's baseline preoperatively. Has actually improved last 48hrs.   Patient historically discharged from the hospital to skilled nursing facility for rehabilitation, however, patient had a very poor experience at this facility and went home with a full time home health aide.  Extremely unfortunately, the aid quit after several hours stating he did not wish to work the long hours and the patient was left alone at home unable to care for himself resulting in his return to the hospital. Patient has stable lower extremity weakness compared to preoperative testing requiring rehabilitation to progress his independence.   - up with PT/OT, encourage ambulation.  Patient would likely benefit from PT/OT two times a day - pain very well controlled, continue current medications as needed. D/C oxycodone and utilize norco to minimize sedation - expected postoperative findings on MRI and CT scan.  No additional surgical intervention is required.  This is currently a social and rehab placement situation - Pending placement by social work  for  SNF.  Patient clearly requires extensive physical therapy and rehabilitation and is unable to proceed home safely on his own.  The patient is a veteran and has VA benefits   - up with PT/OT, encourage ambulation - Norco for pain, Robaxin for muscle spasms - DC to skilled nursing facility when available

## 2022-05-19 NOTE — Progress Notes (Signed)
Nurse went in to give patient his 10 mg of Norco (2 tablets) and patient refused. Stated "he had been getting 2 tablets of Oxy  (10 mg) and wanted that". This RN went in and spoke with patient, as I myself gave him 2 tablets of Norco yesterday morning and told patient what he was getting along with his Colace. The 10 mg of Norco has been working for the patient, per his reassessments and this was also educated to the patient. The patient called the office and I assisted with telling them we would page the doctor and notify the office with details. I also told Mr. Laufer that he had 5 mg max ordered of Oxy and that he had only received this one time (last night). He then told me he would just "have his son bring his Oxy from home and take 10 mg of his own". It was explained how we do not allow that while in the hospital. Notified the office and explained the situation. Will await to hear from MD or his PA. Jillyn Hidden, RN

## 2022-05-19 NOTE — Discharge Summary (Addendum)
Patient ID: Evan Morgan MRN: [id number] DOB/AGE: May 20, 1951 72 y.o.  Admit date: 05/12/2022 Discharge date: 05/26/2022  Admission Diagnoses:  Principal Problem:   Intractable low back pain Active Problems:   Post-op pain   Discharge Diagnoses:  Same  Past Medical History:  Diagnosis Date   Arthritis    knees   Asthma    As a child   History of hepatitis C    Hypertension     Surgeries:  on * No surgery found *   Consultants:   Discharged Condition: Improved  Hospital Course: DELSHON BLANCHFIELD is an 71 y.o. male who was admitted 05/12/2022 for operative treatment of Intractable low back pain. Patient has severe unremitting pain that affects sleep, daily activities, and work/hobbies. After pre-op clearance the patient was taken to the operating room on * No surgery found * and underwent  .    Patient was given perioperative antibiotics:  Anti-infectives (From admission, onward)    None        Patient was given sequential compression devices, early ambulation to prevent DVT.  Patient benefited maximally from hospital stay and there were no complications.    Recent vital signs: Patient Vitals for the past 24 hrs:  BP Temp Temp src Pulse Resp SpO2  05/19/22 0400 126/78 97.7 F (36.5 C) Axillary (!) 55 16 99 %  05/18/22 2315 122/77 97.7 F (36.5 C) Axillary (!) 57 18 99 %  05/18/22 2000 132/75 97.8 F (36.6 C) Axillary 66 18 99 %  05/18/22 1516 101/73 98.3 F (36.8 C) Oral 66 16 98 %  05/18/22 1132 116/76 (!) 97.5 F (36.4 C) Oral 66 16 100 %     Discharge Medications:   Allergies as of 05/19/2022       Reactions   Amlodipine Other (See Comments)   Unknown reaction    Codeine Nausea And Vomiting   Lisinopril Cough        Medication List     STOP taking these medications    oxyCODONE-acetaminophen 5-325 MG tablet Commonly known as: PERCOCET/ROXICET       TAKE these medications    acetaminophen 500 MG tablet Commonly known as: TYLENOL Take  1,000 mg by mouth every 6 (six) hours as needed for moderate pain.   atenolol 50 MG tablet Commonly known as: TENORMIN Take 50 mg by mouth daily.   Cholecalciferol 50 MCG (2000 UT) Tabs Take 2,000 Units by mouth daily.   Dulcolax 10 MG suppository Generic drug: bisacodyl Place 10 mg rectally daily as needed for constipation.   finasteride 5 MG tablet Commonly known as: PROSCAR Take 5 mg by mouth daily.   gabapentin 300 MG capsule Commonly known as: NEURONTIN Take 900 mg by mouth at bedtime.   hydrALAZINE 10 MG tablet Commonly known as: APRESOLINE Take 10 mg by mouth daily.   hydrochlorothiazide 25 MG tablet Commonly known as: HYDRODIURIL Take 25 mg by mouth daily.   HYDROcodone-acetaminophen 5-325 MG tablet Commonly known as: NORCO/VICODIN Take 1-2 tablets by mouth every 4 (four) hours as needed for moderate pain.   methocarbamol 500 MG tablet Commonly known as: ROBAXIN Take 1-2 tablets (500-1,000 mg total) by mouth every 6 (six) hours as needed for muscle spasms. What changed: how much to take   nystatin-triamcinolone ointment Commonly known as: MYCOLOG Apply 1 Application topically daily as needed for dry skin.   ondansetron 4 MG tablet Commonly known as: ZOFRAN Take 1 tablet (4 mg total) by mouth every 6 (  six) hours as needed for nausea.   Senna Laxative 8.6 MG tablet Generic drug: senna Take 1 tablet by mouth 2 (two) times daily. Hold for loose stools.   tamsulosin 0.4 MG Caps capsule Commonly known as: FLOMAX Take 0.8 mg by mouth daily.        Diagnostic Studies: CT LUMBAR SPINE WO CONTRAST  Result Date: 05/14/2022 CLINICAL DATA:  Low back pain. Abnormal MRI of the lumbar spine. Spine surgery 1 month ago without significant improvement of his symptoms. EXAM: CT LUMBAR SPINE WITHOUT CONTRAST TECHNIQUE: Multidetector CT imaging of the lumbar spine was performed without intravenous contrast administration. Multiplanar CT image reconstructions were also  generated. RADIATION DOSE REDUCTION: This exam was performed according to the departmental dose-optimization program which includes automated exposure control, adjustment of the mA and/or kV according to patient size and/or use of iterative reconstruction technique. COMPARISON:  MRI lumbar spine 05/13/2022. CT of the lumbar spine without contrast 02/06/2022 FINDINGS: Segmentation: 5 non rib-bearing lumbar type vertebral bodies are present. The lowest fully formed vertebral body is L5. Alignment: Slight anterolisthesis at L3-4 is stable. Other significant listhesis is present. Straightening of the normal lumbar lordosis is present. Leftward curvature is present in the lumbar spine. Vertebrae: Vertebral body heights are stable. Screws were removed at the L2 level. Pedicle screws at L3 and L4 are stable. New pedicle screws are in place at L5. No lucency is present about the screws. Paraspinal and other soft tissues: Stranding is present posterior to the operative sites. Fluid seen at the left L4-5 laminectomy and foraminotomy is soft tissue density on CT. Atherosclerotic changes are present in the aorta and branch vessels. Disc levels: L1-2: Mild disc bulging and facet hypertrophy without significant stenosis. L2-3: Solid fusion. No residual or recurrent stenosis. L3-4: Stable laminectomy. Gas is no longer evident within the disc space. No definite bridging bone is present. Posterior elements are fused. L4-5: Left laminectomy and foraminotomy noted. Disc spacer is contained within the disc space. No osseous stenosis is present. Hardware is intact. Early bridging bone is present across the disc space. L5-S1: Mild disc bulging and facet hypertrophy is present without significant stenosis. IMPRESSION: 1. Recent left laminectomy and foraminotomy at L4-5. 2. Early bridging bone across the disc space at L4-5 3. Soft tissue density fluid at the left L4-5 laminectomy and foraminotomy is soft tissue density on CT, consistent  with fluid seen on the MRI scan. 4. New pedicle screws at L5 without complication 5. Interval removal of L2 pedicle screws 6. Gas previously seen in the disc space at L3-4 is no longer present. There is still no bridging bone across the disc space. The posterior elements are fused. 7. Stable solid fusion at L2-3 without residual or recurrent stenosis. 8. Stable mild disc bulging and facet hypertrophy at L1-2 and L5-S1 without significant stenosis. 9. Aortic Atherosclerosis (ICD10-I70.0). Electronically Signed   By: Marin Roberts M.D.   On: 05/14/2022 17:32   MR Lumbar Spine W Wo Contrast  Result Date: 05/13/2022 CLINICAL DATA:  71 year old male status post lumbar surgeries, most recently month. Discharged from rehab yesterday. Unable to stand now. EXAM: MRI LUMBAR SPINE WITHOUT AND WITH CONTRAST TECHNIQUE: Multiplanar and multiecho pulse sequences of the lumbar spine were obtained without and with intravenous contrast. CONTRAST:  31mL GADAVIST GADOBUTROL 1 MMOL/ML IV SOLN COMPARISON:  Lumbar MRI 02/01/2022.  CT lumbar spine 02/06/2022. FINDINGS: Segmentation: Normal on the May CT, same numbering system used on MRI at that time. Alignment: Stable straightening of lumbar  lordosis with superimposed mild anterolisthesis of L3 on L4. Vertebrae: New L4-L5 interbody implant and L5 pedicle screws since May. Mild hardware susceptibility artifact but evidence of postoperative endplate marrow edema there. Left L4 laminectomy is new. Normal background bone marrow signal. Visible sacrum and SI joints appear intact. Conus medullaris and cauda equina: Conus extends to the T12-L1 level. No lower spinal cord or conus signal abnormality. Abnormal dural thickening and enhancement at L4-L5 eccentric to the left. See series 11, image 30). Heterogeneous left laminectomy space fluid collection there, and thickened, homogeneously enhancing posterior epidural space extending up to the L3-L4 level (series 11, image 22). No  abnormal intradural enhancement identified. Paraspinal and other soft tissues: Small volume superficial multifocal tracking fluid from the L4-L5 laminectomy space as seen on series 7, image 11. Stable visible abdominal viscera including nonenhancing benign appearing renal cysts *INSERT* macro Disc levels: T11-T12 through L2-L3: Stable since 02/01/2022 MRI. Previous interbody fusion at the latter. L3-L4: Mild new multifactorial spinal stenosis at this previously fused and decompressed level appears related to bulky posterior epidural space granulation versus edema or complex fluid (seemingly homogeneously enhancing on series 11, image 22. Stable multifactorial L3 neural foraminal stenosis which is moderate to severe in greater on the right. L4-L5: Recent postoperative changes including left laminectomy. Bulky left side synovial cyst here previously. But continued moderate to severe spinal and lateral recess stenosis (series 9, image 29) related to residual moderate to severe right posterior element hypertrophy and left-side postoperative laminectomy space fluid collection. Dural thickening and enhancement here eccentric to the left. Moderate to severe bilateral L4 foraminal stenosis although foraminal patency does appear improved. L5-S1: Increased epidural lipomatosis here. Otherwise stable disc bulging, endplate spurring and facet hypertrophy. Moderate to severe left and mild to moderate right L5 foraminal stenosis appears stable. IMPRESSION: 1. Multilevel lumbar spine surgery with recent postoperative changes at L4-L5 including left laminectomy. But ongoing severe spinal and lateral recess stenosis there, in large part due to heterogeneous fluid collection in the laminectomy space with surrounding dural and/or epidural thickening and enhancement. Query postoperative hematoma or seroma. No strong evidence of spinal infection although there is some dural enhancement there. 2. And mild new spinal stenosis at L3-L4  since May also related to bulky posterior epidural complex fluid or soft tissue contiguous with the above. 3. Other lumbar levels are stable since 02/01/2022 MRI. Study discussed by telephone with Dr. Luiz Blare on 05/13/2022 at 08:32 . Electronically Signed   By: Odessa Fleming M.D.   On: 05/13/2022 08:40   MR THORACIC SPINE WO CONTRAST  Result Date: 05/13/2022 CLINICAL DATA:  Initial evaluation for acute mid back pain. EXAM: MRI CERVICAL AND THORACIC SPINE WITHOUT CONTRAST TECHNIQUE: Multiplanar and multiecho pulse sequences of the cervical spine, to include the craniocervical junction and cervicothoracic junction, and the thoracic spine, were obtained without intravenous contrast. COMPARISON:  Prior MRI from 03/05/2022. FINDINGS: MRI CERVICAL SPINE FINDINGS Alignment: Examination mildly degraded by motion artifact. Trace degenerative anterolisthesis of C6 on C7 and C7 on T1. Alignment otherwise normal preservation of the normal cervical lordosis. Vertebrae: Vertebral body height maintained without acute or chronic fracture. Bone marrow signal intensity within normal limits. No discrete or worrisome osseous lesions. No abnormal marrow edema. Cord: Normal signal and morphology. No convincing cord signal changes on this mildly motion degraded exam. Posterior Fossa, vertebral arteries, paraspinal tissues: Visualized brain and posterior fossa within normal limits. Craniocervical junction within normal limits. Paraspinous soft tissues demonstrate no acute finding. Markedly hypoplastic left vertebral artery  with poorly visualized flow void, possibly occluded, stable from prior. Preserved flow voids seen within the contralateral dominant right vertebral artery. Incidental note made of a small 4 mm cystic lesion at the right upper neck (series 4, image 10), indeterminate, but stable from prior, and of doubtful significance. Disc levels: C2-C3: Minimal disc bulge. Moderate left with mild right facet hypertrophy. No significant  spinal stenosis. Mild left C3 foraminal narrowing. Right neural foramen remains patent. C3-C4: Broad-based posterior disc bulge indents the ventral thecal sac. Mild facet and ligament flavum hypertrophy. Resultant mild spinal stenosis. Foramina remain patent. C4-C5: Broad-based central disc protrusion indents the ventral thecal sac (series 5, image 22). Mild facet and ligament flavum hypertrophy. Mild spinal stenosis. Superimposed bilateral uncovertebral spurring with resultant mild left with mild-to-moderate right C5 foraminal stenosis. C5-C6: Central disc protrusion indents the ventral thecal sac, contacting the ventral cord (series 5, image 27). Minimal cord flattening without cord signal changes. Superimposed facet and ligament flavum hypertrophy. Resultant mild to moderate spinal stenosis. Severe left with moderate right C6 foraminal narrowing. C6-C7: Central to right paracentral disc protrusion (series 4, image 31). Superimposed bilateral facet and uncovertebral hypertrophy. No significant spinal stenosis. Moderate left with mild right C7 foraminal stenosis. C7-T1: Trace anterolisthesis. Mild disc bulge with bilateral facet hypertrophy. No spinal stenosis. Mild bilateral C8 foraminal narrowing. MRI THORACIC SPINE FINDINGS Alignment: Examination technically limited as the patient was unable to tolerate the full length of the exam. No axial images were performed. Additionally, the provided images are degraded by motion artifact. Vertebral bodies normally aligned with preservation of the normal thoracic kyphosis. No significant listhesis. Vertebrae: Vertebral body height maintained without acute or chronic fracture. Bone marrow signal intensity within normal limits. No worrisome osseous lesions. No definite abnormal marrow edema, although the STIR sequence is markedly degraded by motion. Cord:  Grossly normal signal and morphology on this limited exam. Paraspinal and other soft tissues: Postoperative changes  partially visualized within the upper posterior lumbar soft tissues. Paraspinous soft tissues demonstrate no other definite abnormality. Disc levels: T5-6: Probable central disc extrusion with superior migration (series 19, image 10). No significant spinal stenosis. Foramina remain patent. C6-7: Disc bulge with probable small central disc protrusion and annular fissure. No significant spinal stenosis. Foramina remain patent. T7-8: Disc desiccation with mild disc bulge. No significant stenosis. T8-9: Disc desiccation with mild disc bulge. No significant stenosis. T9-10: Disc desiccation with mild disc bulge. Mild facet hypertrophy. No significant spinal stenosis. Mild bilateral foraminal narrowing. T10-11: Central disc protrusion indents the ventral thecal sac (series 19, image 10). Probable minimal flattening of the ventral cord without definite cord signal changes or significant spinal stenosis. Foramina appear patent. Otherwise, no other significant disc pathology seen within the thoracic spine on this limited exam. No other appreciable stenosis or neural impingement. IMPRESSION: MRI CERVICAL SPINE IMPRESSION: 1. No acute abnormality within the cervical spine. 2. Multilevel cervical spondylosis with resultant diffuse spinal stenosis at C3-4 through C5-6, most pronounced at C5-6 where stenosis is mild to moderate in nature. 3. Multifactorial degenerative changes with resultant multilevel foraminal narrowing as above. Notable findings include mild-to-moderate right C5 foraminal stenosis, severe left with moderate right C6 foraminal narrowing, with moderate left C7 foraminal narrowing. 4. 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 MRI THORACIC SPINE IMPRESSION: 1. Technically limited exam due to motion artifact and the patient's inability to tolerate the full length of the study. No axial images were obtained. 2. No acute abnormality within the thoracic spine. 3.  Multilevel  spondylosis at T5-6 through T10-11 as above, which could contribute to mid back pain. No significant spinal stenosis. Mild bilateral foraminal narrowing at T9-10. Electronically Signed   By: Rise Mu M.D.   On: 05/13/2022 05:03   MR CERVICAL SPINE WO CONTRAST  Result Date: 05/13/2022 CLINICAL DATA:  Initial evaluation for acute mid back pain. EXAM: MRI CERVICAL AND THORACIC SPINE WITHOUT CONTRAST TECHNIQUE: Multiplanar and multiecho pulse sequences of the cervical spine, to include the craniocervical junction and cervicothoracic junction, and the thoracic spine, were obtained without intravenous contrast. COMPARISON:  Prior MRI from 03/05/2022. FINDINGS: MRI CERVICAL SPINE FINDINGS Alignment: Examination mildly degraded by motion artifact. Trace degenerative anterolisthesis of C6 on C7 and C7 on T1. Alignment otherwise normal preservation of the normal cervical lordosis. Vertebrae: Vertebral body height maintained without acute or chronic fracture. Bone marrow signal intensity within normal limits. No discrete or worrisome osseous lesions. No abnormal marrow edema. Cord: Normal signal and morphology. No convincing cord signal changes on this mildly motion degraded exam. Posterior Fossa, vertebral arteries, paraspinal tissues: Visualized brain and posterior fossa within normal limits. Craniocervical junction within normal limits. Paraspinous soft tissues demonstrate no acute finding. Markedly hypoplastic left vertebral artery with poorly visualized flow void, possibly occluded, stable from prior. Preserved flow voids seen within the contralateral dominant right vertebral artery. Incidental note made of a small 4 mm cystic lesion at the right upper neck (series 4, image 10), indeterminate, but stable from prior, and of doubtful significance. Disc levels: C2-C3: Minimal disc bulge. Moderate left with mild right facet hypertrophy. No significant spinal stenosis. Mild left C3 foraminal narrowing. Right  neural foramen remains patent. C3-C4: Broad-based posterior disc bulge indents the ventral thecal sac. Mild facet and ligament flavum hypertrophy. Resultant mild spinal stenosis. Foramina remain patent. C4-C5: Broad-based central disc protrusion indents the ventral thecal sac (series 5, image 22). Mild facet and ligament flavum hypertrophy. Mild spinal stenosis. Superimposed bilateral uncovertebral spurring with resultant mild left with mild-to-moderate right C5 foraminal stenosis. C5-C6: Central disc protrusion indents the ventral thecal sac, contacting the ventral cord (series 5, image 27). Minimal cord flattening without cord signal changes. Superimposed facet and ligament flavum hypertrophy. Resultant mild to moderate spinal stenosis. Severe left with moderate right C6 foraminal narrowing. C6-C7: Central to right paracentral disc protrusion (series 4, image 31). Superimposed bilateral facet and uncovertebral hypertrophy. No significant spinal stenosis. Moderate left with mild right C7 foraminal stenosis. C7-T1: Trace anterolisthesis. Mild disc bulge with bilateral facet hypertrophy. No spinal stenosis. Mild bilateral C8 foraminal narrowing. MRI THORACIC SPINE FINDINGS Alignment: Examination technically limited as the patient was unable to tolerate the full length of the exam. No axial images were performed. Additionally, the provided images are degraded by motion artifact. Vertebral bodies normally aligned with preservation of the normal thoracic kyphosis. No significant listhesis. Vertebrae: Vertebral body height maintained without acute or chronic fracture. Bone marrow signal intensity within normal limits. No worrisome osseous lesions. No definite abnormal marrow edema, although the STIR sequence is markedly degraded by motion. Cord:  Grossly normal signal and morphology on this limited exam. Paraspinal and other soft tissues: Postoperative changes partially visualized within the upper posterior lumbar soft  tissues. Paraspinous soft tissues demonstrate no other definite abnormality. Disc levels: T5-6: Probable central disc extrusion with superior migration (series 19, image 10). No significant spinal stenosis. Foramina remain patent. C6-7: Disc bulge with probable small central disc protrusion and annular fissure. No significant spinal stenosis. Foramina remain patent. T7-8: Disc desiccation with  mild disc bulge. No significant stenosis. T8-9: Disc desiccation with mild disc bulge. No significant stenosis. T9-10: Disc desiccation with mild disc bulge. Mild facet hypertrophy. No significant spinal stenosis. Mild bilateral foraminal narrowing. T10-11: Central disc protrusion indents the ventral thecal sac (series 19, image 10). Probable minimal flattening of the ventral cord without definite cord signal changes or significant spinal stenosis. Foramina appear patent. Otherwise, no other significant disc pathology seen within the thoracic spine on this limited exam. No other appreciable stenosis or neural impingement. IMPRESSION: MRI CERVICAL SPINE IMPRESSION: 1. No acute abnormality within the cervical spine. 2. Multilevel cervical spondylosis with resultant diffuse spinal stenosis at C3-4 through C5-6, most pronounced at C5-6 where stenosis is mild to moderate in nature. 3. Multifactorial degenerative changes with resultant multilevel foraminal narrowing as above. Notable findings include mild-to-moderate right C5 foraminal stenosis, severe left with moderate right C6 foraminal narrowing, with moderate left C7 foraminal narrowing. 4. 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 MRI THORACIC SPINE IMPRESSION: 1. Technically limited exam due to motion artifact and the patient's inability to tolerate the full length of the study. No axial images were obtained. 2. No acute abnormality within the thoracic spine. 3. Multilevel spondylosis at T5-6 through T10-11 as above, which could  contribute to mid back pain. No significant spinal stenosis. Mild bilateral foraminal narrowing at T9-10. Electronically Signed   By: Rise Mu M.D.   On: 05/13/2022 05:03    Disposition: Discharge disposition: 03-Skilled Nursing Facility       Discharge Instructions     Discharge patient   Complete by: As directed    Discharge disposition: 01-Home or Self Care   Discharge patient date: 05/19/2022       POD #34 s/p L4-L5 decompression and fusion with utilization of BMP for severe spinal stenosis and weakness on 04/13/2022.  Current functional status and weakness appears consistent with patient's baseline preoperatively. Has actually improved last 48hrs.  Patient reports noticeable improved strength last 2 days.  Pain well controlled.  He has been getting occupational and physical therapy and is feeling better with this.  Social work is working on placing him at a Garment/text[facility] technologist facility through the [employer] at a different facility.  He did not qualify for inpatient rehab because of his benefits.    Patient historically discharged from the hospital to skilled nursing facility for rehabilitation, however, patient had a very poor experience at this facility and went home with a full time home health aide.  Extremely unfortunately, the aid quit after several hours stating he did not wish to work the long hours and the patient was left alone at home unable to care for himself resulting in his return to the hospital. Patient has stable lower extremity weakness compared to preoperative testing requiring rehabilitation to progress his independence.   - up with PT/OT, encourage ambulation.  Patient would likely benefit from PT/OT two times a day - pain very well controlled, continue current medications as needed. D/C oxycodone and utilize norco to minimize sedation - expected postoperative findings on MRI and CT scan.  No additional surgical intervention is required.  This is currently  a social and rehab placement situation - Pending placement by social work  for SNF.  Patient clearly requires extensive physical therapy and rehabilitation and is unable to proceed home safely on his own.  The patient is a veteran and has VA benefits   - up with PT/OT, encourage ambulation - Norco for pain,  Robaxin for muscle spasms - DC to skilled nursing facility when available     -F/U in office 6 weeks PO  Signed: Eilene GhaziKayla J McKenzie 05/19/2022, 7:56 AM

## 2022-05-19 NOTE — Care Management Important Message (Signed)
Important Message  Patient Details  Name: Evan Morgan MRN: [id number] Date of Birth: [birth year]   Medicare Important Message Given:  Yes     Sherilyn Banker 05/19/2022, 10:40 AM

## 2022-05-20 NOTE — Progress Notes (Signed)
    Patient reports ongoing improved strength last several  days.  Pain well controlled.  He has been getting occupational and physical therapy and is feeling better with this.  Social work is working on placing him at a skilled nursing facility at a different facility.  He did not qualify for inpatient rehab because of his benefits. He states a bed was not available yet on Friday     BP 109/68 (BP Location: Right Arm)   Pulse 98   Temp 98.4 F (36.9 C) (Oral)   Resp 18   SpO2 95%     Incision is well-healed and intact.  Minimal tenderness to direct palpation.  No erythema or drainage.  No sign of infection.  He is laying comfortably in his hospital bed with TLSO brace at bedside NVI   POD #35 s/p L4-L5 decompression and fusion with utilization of BMP for severe spinal stenosis and weakness on 04/13/2022.  Current functional status and weakness appears consistent with patient's baseline preoperatively. Has actually improved last 3 days.   Patient historically discharged from the hospital to skilled nursing facility for rehabilitation, however, patient had a very poor experience at this facility and went home with a full time home health aide.  Extremely unfortunately, the aid quit after several hours stating he did not wish to work the long hours and the patient was left alone at home unable to care for himself resulting in his return to the hospital. Patient has stable lower extremity weakness compared to preoperative testing requiring rehabilitation to progress his independence.   - up with PT/OT, encourage ambulation.  Patient would likely benefit from PT/OT two times a day - pain very well controlled, continue current medications as needed. D/C oxycodone and utilize norco to minimize sedation - expected postoperative findings on MRI and CT scan.  No additional surgical intervention is required.  This is currently a social and rehab placement situation - Pending placement by social work  for  SNF.  Patient clearly requires extensive physical therapy and rehabilitation and is unable to proceed home safely on his own.  The patient is a veteran and has VA benefits   - up with PT/OT, encourage ambulation - Oxycodone for pain (This is baseline home medicine), Robaxin for muscle spasms - DC to skilled nursing facility when available weeks

## 2022-05-20 NOTE — Plan of Care (Signed)

## 2022-05-21 NOTE — Progress Notes (Signed)
PATIENT ID: Evan Morgan  MRN: [id number]  DOB/AGE:  [birth year] / 71 y.o.        PROGRESS NOTE Subjective:   Patient is alert, oriented, no Nausea, no Vomiting, yes passing gas, . Taking PO well . Denies SOB, Chest or Calf Pain. Using Incentive Spirometer, PAS in place. Ambulate WBAT, Patient reports pain as mild to moderate  Objective: Vital signs in last 24 hours: Temp:  [98 F (36.7 C)-98.4 F (36.9 C)] 98.4 F (36.9 C) (08/20 0752) Pulse Rate:  [69-79] 75 (08/20 0752) Resp:  [15-20] 20 (08/20 0752) BP: (102-125)/(67-88) 125/88 (08/20 0752) SpO2:  [96 %-98 %] 96 % (08/20 0752)    Intake/Output from previous day: I/O last 3 completed shifts: In: 600 [P.O.:600] Out: 750 [Urine:750]   Intake/Output this shift: No intake/output data recorded.   LABORATORY DATA: No results for input(s): "WBC", "HGB", "HCT", "PLT", "NA", "K", "CL", "CO2", "BUN", "CREATININE", "GLUCOSE", "GLUCAP", "INR", "CALCIUM" in the last 72 hours.  Invalid input(s): "PT", "2"  Examination: Intact pulses distally Dorsiflexion/Plantar flexion intact No cellulitis present Compartment soft} Pt laying comfortably in bed.   Assessment:     POD #36 s/p L4-L5 decompression and fusion with utilization of BMP for severe spinal stenosis and weakness on 04/13/2022.  Current functional status and weakness appears consistent with patient's baseline preoperatively. Has actually improved last 3 days.   Patient historically discharged from the hospital to skilled nursing facility for rehabilitation, however, patient had a very poor experience at this facility and went home with a full time home health aide.  Extremely unfortunately, the aid quit after several hours stating he did not wish to work the long hours and the patient was left alone at home unable to care for himself resulting in his return to the hospital. Patient has stable lower extremity weakness compared to preoperative testing requiring rehabilitation to  progress his independence.   - up with PT/OT, encourage ambulation.  Patient would likely benefit from PT/OT two times a day - pain very well controlled, continue current medications as needed.  - expected postoperative findings on MRI and CT scan.  No additional surgical intervention is required.  This is currently a social and rehab placement situation - Pending placement by social work  for SNF.  Patient clearly requires extensive physical therapy and rehabilitation and is unable to proceed home safely on his own.  The patient is a veteran and has VA benefits - Oxycodone for pain (This is baseline home medicine), Robaxin for muscle spasms - DC to skilled nursing facility when available              Dannielle Burn 05/21/2022, 9:17 AM

## 2022-05-22 NOTE — TOC Progression Note (Signed)
Transition of Care [facility]) - Progression Note    Patient Details  Name: BEVIN MAYALL MRN: [id number] Date of Birth: 1950-12-25  Transition of Care [facility]) CM/SW [place], [place] Phone Number: 05/22/2022, 12:51 PM  Clinical Narrative:     CSW met with patient at bedside. Informed patient TOC continues to work on placement. CSW received call from [facility] in [place]- informed , they report availability but they do not accept [employer] benefits- patient agreeable to use [employer] benefits if accepted by preferred SNF.   CSW faxed clinicals to SNF-  [phone] to review -waiting on decision  TOC will continue to follow and assist with discharge planning.   Thurmond Butts, MSW, LCSW Clinical Social Worker    Expected Discharge Plan: Skilled Nursing Facility Barriers to Discharge: Continued Medical Work up, Ship broker, SNF Pending bed offer  Expected Discharge Plan and Services Expected Discharge Plan: [place] In-house Referral: Clinical Social Work     Living arrangements for the past 2 months: [place] Expected Discharge Date: 05/19/22                                     Social Determinants of Health (SDOH) Interventions    Readmission Risk Interventions     No data to display

## 2022-05-22 NOTE — TOC Progression Note (Addendum)
Transition of Care [facility]) - Progression Note    Patient Details  Name: Evan Morgan MRN: [id number] Date of Birth: [birth year]  Transition of Care [facility]) CM/SW Contact  Eduard Roux, [place] Phone Number: 05/22/2022, 4:50 PM  Clinical Narrative:     LVM w/ [facility] in [place]- awaiting return call for determination.  Patient was at SNF/[facility] 07/18-08/10 - patient will be in co-pay  status at SNF if he uses [facility] Medicare benefits, that could be paid by Medicaid if SNF is willing to accept Medicaid verses using VA benefits that will cover 30 days at 100%. However, unfortunately, using VA benefits is a longer process.   TOC will continue to follow and assist with discharge planning.   Expected Discharge Plan: Skilled Nursing Facility Barriers to Discharge: Continued Medical Work up, English as a second language teacher, SNF Pending bed offer  Expected Discharge Plan and Services Expected Discharge Plan: Skilled Nursing Facility In-house Referral: Clinical Social Work     Living arrangements for the past 2 months: Skilled Nursing Facility Expected Discharge Date: 05/19/22                                     Social Determinants of Health (SDOH) Interventions    Readmission Risk Interventions     No data to display

## 2022-05-22 NOTE — Progress Notes (Signed)
    Patient doing well with controlled pain.  Ongoing occupational physical therapy which she is finding beneficial.  He is awaiting bed placement at skilled nursing facility.  According to his social worker VA benefits have finally come through to cover the skilled nursing facility and she is simply searching for a facility that will accept him and has an available bed.  She anticipates bed placement hopefully by Wednesday.  Physical Exam: Vitals:   05/22/22 0743 05/22/22 1145  BP: 113/73 118/74  Pulse: 65 76  Resp: 14 15  Temp: 98.1 F (36.7 C) 98.1 F (36.7 C)  SpO2: 96% 97%    Dressing in place, patient resting comfortably in bed, appears comfortable and stable physical exam  POD #37 s/p L4-L5 decompression and fusion with utilization of BMP for severe spinal stenosis and weakness on 04/13/2022.  Current functional status and weakness appears consistent with patient's baseline preoperatively. Has actually improved last 3 days.   Patient historically discharged from the hospital to skilled nursing facility for rehabilitation, however, patient had a very poor experience at this facility and went home with a full time home health aide.  Extremely unfortunately, the aid quit after several hours stating he did not wish to work the long hours and the patient was left alone at home unable to care for himself resulting in his return to the hospital. Patient has stable lower extremity weakness compared to preoperative testing requiring rehabilitation to progress his independence.   - up with PT/OT, encourage ambulation.  Patient would likely benefit from PT/OT two times a day - pain very well controlled, continue current medications as needed. D/C oxycodone and utilize norco to minimize sedation - expected postoperative findings on MRI and CT scan.  No additional surgical intervention is required.  This is currently a social and rehab placement situation - Pending placement by social work  for  SNF.  Patient clearly requires extensive physical therapy and rehabilitation and is unable to proceed home safely on his own.  The patient is a veteran and has VA benefits   - up with PT/OT, encourage ambulation - Oxycodone for pain (This is baseline home medicine), Robaxin for muscle spasms - DC to skilled nursing facility when available  - F/U in office at 6 weeks PO

## 2022-05-22 NOTE — Progress Notes (Signed)
Physical Therapy Treatment Patient Details Name: Evan Morgan MRN: [id number] DOB: [birth year] Today's Date: 05/22/2022   History of Present Illness 71 year old man admitted on 05/12/22 due to back pain, LE weakness and FTT at home after d/c from SNF to home with aide and his aide quit so presented to [place] then transferred to [facility].  Had been d/c a month ago after stay with severe L 4/5 spinal stenosis. Underwent removal of hardware, decompression L4-5 TLIF, R posterolateral fusion on 04/13/2022. PMH significant for HEP C, HTN, back surgeries x 3.    PT Comments    Patient progressing this session out of the room for some wheelchair management which he performed with supervision.  He was needing increased assist this pm to stand in [place] than last session.  He attempted sit to stand pulling up on wall rail, but unable to get legs under him in lower wheelchair.  PT will continue to follow acutely.    Recommendations for follow up therapy are one component of a multi-disciplinary discharge planning process, led by the attending physician.  Recommendations may be updated based on patient status, additional functional criteria and insurance authorization.  Follow Up Recommendations  Skilled nursing-short term rehab (<3 hours/day) Can patient physically be transported by private vehicle: No   Assistance Recommended at Discharge Frequent or constant Supervision/Assistance  Patient can return home with the following A lot of help with bathing/dressing/bathroom;A lot of help with walking and/or transfers;Assistance with cooking/housework;Assist for transportation   Equipment Recommendations  Other (comment) (TBA)    Recommendations for Other Services       Precautions / Restrictions Precautions Precautions: Fall;Back Required Braces or Orthoses: Spinal Brace Spinal Brace: Thoracolumbosacral orthotic;Applied in sitting position     Mobility  Bed Mobility Overal bed mobility: Needs Assistance Bed  Mobility: Supine to Sit, Sit to Supine     Supine to sit: HOB elevated, Supervision Sit to supine: Min assist   General bed mobility comments: increased time and use of rail, cues to roll, but pt pulled up from supine once legs off the bed; assist for legs and positioning to supine    Transfers Overall transfer level: Needs assistance   Transfers: Sit to/from Stand, Bed to chair/wheelchair/BSC Sit to Stand: Mod assist, +2 physical assistance, From elevated surface           General transfer comment: up from EOB to stedy mod A of 1, +2 for up from wheelchair due to lower surface; Attempted to stand at wall rail in hallway, but pt too low in wheelchair and difficulty getting his feet under him Transfer via Lift Equipment: Stedy  Ambulation/Gait                   Psychologist, counselling mobility: Yes Wheelchair propulsion: Both upper extremities Wheelchair parts: Supervision/cueing Distance: 140'; with one long stop Wheelchair Assistance Details (indicate cue type and reason): assist for feet on footplates and for legrests, cues for locking brakes and repositioning in chair  Modified Rankin (Stroke Patients Only)       Balance Overall balance assessment: Needs assistance   Sitting balance-Leahy Scale: Poor Sitting balance - Comments: UE support and rounded back posture on EOB   Standing balance support: Bilateral upper extremity supported Standing balance-Leahy Scale: Poor  Cognition Arousal/Alertness: Awake/alert Behavior During Therapy: WFL for tasks assessed/performed Overall Cognitive Status: Within Functional Limits for tasks assessed                                          Exercises Other Exercises Other Exercises: armchair pushups x 10    General Comments        Pertinent Vitals/Pain Pain Assessment Faces Pain Scale: Hurts even  more Pain Location: chronic back pain Pain Descriptors / Indicators: Aching Pain Intervention(s): Monitored during session    Home Living                          Prior Function            PT Goals (current goals can now be found in the care plan section) Progress towards PT goals: Progressing toward goals    Frequency    Min 3X/week      PT Plan Current plan remains appropriate    Co-evaluation              AM-PAC PT "6 Clicks" Mobility   Outcome Measure  Help needed turning from your back to your side while in a flat bed without using bedrails?: A Lot Help needed moving from lying on your back to sitting on the side of a flat bed without using bedrails?: A Lot Help needed moving to and from a bed to a chair (including a wheelchair)?: A Lot Help needed standing up from a chair using your arms (e.g., wheelchair or bedside chair)?: Total Help needed to walk in hospital room?: Total Help needed climbing 3-5 steps with a railing? : Total 6 Click Score: 9    End of Session Equipment Utilized During Treatment: Gait belt;Back brace Activity Tolerance: Patient tolerated treatment well Patient left: in bed;with call bell/phone within reach;with bed alarm set   PT Visit Diagnosis: Other abnormalities of gait and mobility (R26.89);Muscle weakness (generalized) (M62.81);Other symptoms and signs involving the nervous system (R29.898)     Time: 1455-1535 PT Time Calculation (min) (ACUTE ONLY): 40 min  Charges:  $Therapeutic Exercise: 8-22 mins $Therapeutic Activity: 8-22 mins $Wheel Chair Management: 8-22 mins                     Sheran Lawless, PT Acute Rehabilitation Services Office:[phone] 05/22/2022    [name] 05/22/2022, 6:23 PM

## 2022-05-22 NOTE — Care Management Important Message (Signed)
Important Message  Patient Details  Name: ARCH METHOT MRN: [id number] Date of Birth: [birth year]   Medicare Important Message Given:  Yes     Sherilyn Banker 05/22/2022, 12:16 PM

## 2022-05-22 NOTE — TOC Progression Note (Signed)
Transition of Care [facility]) - Progression Note    Patient Details  Name: Evan Morgan MRN: [id number] Date of Birth: 12/19/1950  [facility]) CM/SW Contact  Eduard Roux, [place] Phone Number: 05/22/2022, 5:30 PM  Clinical Narrative:     Patient signed 65 VA form as requested by [facility]- CSW emailed signed form to Admission Karsten Ro.   Placement still pending- CSW awaiting determination  Antony Blackbird, MSW, LCSW Clinical Social Worker    Expected Discharge Plan: Skilled Nursing Facility Barriers to Discharge: Continued Medical Work up, English as a second language teacher, SNF Pending bed offer  Expected Discharge Plan and Services Expected Discharge Plan: Skilled Nursing Facility In-house Referral: Clinical Social Work     Living arrangements for the past 2 months: Skilled Nursing Facility Expected Discharge Date: 05/19/22                                     Social Determinants of Health (SDOH) Interventions    Readmission Risk Interventions     No data to display

## 2022-05-23 NOTE — TOC Progression Note (Signed)
Transition of Care [facility]) - Progression Note    Patient Details  Name: Evan Morgan MRN: [id number] Date of Birth: 1951-06-24  Transition of Care [facility]) CM/SW Contact  Eduard Roux, [place] Phone Number: 05/23/2022, 10:59 AM  Clinical Narrative:     Received call form [facility]/ Marlyce Huge- she have received requested information - she will call back today with status.   Antony Blackbird, MSW, LCSW Clinical Social Worker    Expected Discharge Plan: Skilled Nursing Facility Barriers to Discharge: Continued Medical Work up, English as a second language teacher, SNF Pending bed offer  Expected Discharge Plan and Services Expected Discharge Plan: Skilled Nursing Facility In-house Referral: Clinical Social Work     Living arrangements for the past 2 months: Skilled Nursing Facility Expected Discharge Date: 05/19/22                                     Social Determinants of Health (SDOH) Interventions    Readmission Risk Interventions     No data to display

## 2022-05-23 NOTE — TOC Progression Note (Signed)
Transition of Care [facility]) - Progression Note    Patient Details  Name: Evan Morgan MRN: [id number] Date of Birth: 02/26/1951  Transition of Care [facility]) CM/SW Contact  Eduard Roux, [place] Phone Number: 05/23/2022, 3:53 PM  Clinical Narrative:     Sent e-mail to [facility]- requested an update  Expected Discharge Plan: Skilled Nursing Facility Barriers to Discharge: Continued Medical Work up, English as a second language teacher, SNF Pending bed offer  Expected Discharge Plan and Services Expected Discharge Plan: Skilled Nursing Facility In-house Referral: Clinical Social Work     Living arrangements for the past 2 months: Skilled Nursing Facility Expected Discharge Date: 05/19/22                                     Social Determinants of Health (SDOH) Interventions    Readmission Risk Interventions     No data to display

## 2022-05-23 NOTE — Progress Notes (Signed)
Occupational Therapy Treatment Patient Details Name: Evan Morgan MRN: [id number] DOB: [birth year] Today's Date: 05/23/2022   History of present illness 71 year old man admitted on 05/12/22 due to back pain, LE weakness and FTT at home after d/c from SNF to home with aide and his aide quit so presented to [place] then transferred to [facility].  Had been d/c a month ago after stay with severe L 4/5 spinal stenosis. Underwent removal of hardware, decompression L4-5 TLIF, R posterolateral fusion on 04/13/2022. PMH significant for HEP C, HTN, back surgeries x 3.   OT comments  Pt sustained 20 minutes seated stedy grooming task this session. Pt very pleased to be able to utilize a mirror. Pt able to initiate the don of the brace and with balance (A) able to close the back brace. Pt requires elevated surface to stand with one person (A) into stedy. Pt smiling at the end of session in chair. Recommendation SNF for progression of eob static sitting needs.    Recommendations for follow up therapy are one component of a multi-disciplinary discharge planning process, led by the attending physician.  Recommendations may be updated based on patient status, additional functional criteria and insurance authorization.    Follow Up Recommendations  Skilled nursing-short term rehab (<3 hours/day)    Assistance Recommended at Discharge Set up Supervision/Assistance  Patient can return home with the following  Two people to help with walking and/or transfers   Equipment Recommendations  Wheelchair (measurements OT);Wheelchair cushion (measurements OT);Hospital bed    Recommendations for Other Services      Precautions / Restrictions Precautions Precautions: Fall;Back Required Braces or Orthoses: Spinal Brace Spinal Brace: Thoracolumbosacral orthotic;Applied in sitting position Restrictions Weight Bearing Restrictions: No RLE Weight Bearing: Weight bearing as tolerated LLE Weight Bearing: Weight bearing as tolerated        Mobility Bed Mobility Overal bed mobility: Needs Assistance Bed Mobility: Rolling, Supine to Sit Rolling: Min assist   Supine to sit: Mod assist     General bed mobility comments: pt requires heavy use of bed rail and increased the HOB to 25 degrees to push up with R UE    Transfers Overall transfer level: Needs assistance   Transfers: Sit to/from Stand Sit to Stand: Mod assist, From elevated surface           General transfer comment: bed elevated >20 inches to stand in stedy. pt able to stand in stedy to have flaps moved by therapist to descend to elevated surface built up chair     Balance Overall balance assessment: Needs assistance Sitting-balance support: Bilateral upper extremity supported, Feet supported Sitting balance-Leahy Scale: Poor Sitting balance - Comments: requires UE support to keep from LOB Postural control: Posterior lean Standing balance support: Bilateral upper extremity supported, During functional activity, Reliant on assistive device for balance Standing balance-Leahy Scale: Poor Standing balance comment: stood in steady for less than 1 minute full standing. pt in stedy with flap seating for 20 minutes                           ADL either performed or assessed with clinical judgement   ADL Overall ADL's : Needs assistance/impaired     Grooming: Moderate assistance;Wash/dry face;Standing Grooming Details (indicate cue type and reason): sink level grooming task of shaving. pt shaving his face with electric razor then with a regular razor. pt at the sink in a semi stand for 20 minutes. pt requiers (A)  with L side facial shaving to do increased lateral L lean Upper Body Bathing: Maximal assistance Upper Body Bathing Details (indicate cue type and reason): wash hand at sink level with therapist completing the task     Upper Body Dressing : Moderate assistance;Sitting Upper Body Dressing Details (indicate cue type and reason): don  new gown and back brace. pt with posterior LOB at EOB. Lower Body Dressing: Total assistance                 General ADL Comments: pt in bed on arrival and agreeable to oob in steady. pt very excited to get to sink level this session and reports >month without seeing a mirror.    Extremity/Trunk Assessment Upper Extremity Assessment Upper Extremity Assessment: Generalized weakness   Lower Extremity Assessment Lower Extremity Assessment: Defer to PT evaluation        Vision       Perception     Praxis      Cognition Arousal/Alertness: Awake/alert Behavior During Therapy: WFL for tasks assessed/performed Overall Cognitive Status: Within Functional Limits for tasks assessed                                          Exercises      Shoulder Instructions       General Comments      Pertinent Vitals/ Pain       Pain Assessment Pain Assessment: Faces Faces Pain Scale: Hurts a little bit Pain Location: chronic back pain Pain Descriptors / Indicators: Sore Pain Intervention(s): Limited activity within patient's tolerance, Monitored during session, Premedicated before session, Repositioned  Home Living                                          Prior Functioning/Environment              Frequency  Min 2X/week        Progress Toward Goals  OT Goals(current goals can now be found in the care plan section)  Progress towards OT goals: Progressing toward goals  Acute Rehab OT Goals Patient Stated Goal: to go to a [facility] facility OT Goal Formulation: With patient Time For Goal Achievement: 05/29/22 Potential to Achieve Goals: Good ADL Goals Pt Will Perform Lower Body Dressing: with mod assist;sit to/from stand;with adaptive equipment Pt Will Transfer to Toilet: with mod assist;bedside commode;stand pivot transfer Additional ADL Goal #1: pt will complete bed mobility min (A) Additional ADL Goal #2: pt will verbalize back  precautions 3 out 3 100 % correct  Plan Discharge plan remains appropriate    Co-evaluation                 AM-PAC OT "6 Clicks" Daily Activity     Outcome Measure   Help from another person eating meals?: A Little Help from another person taking care of personal grooming?: A Little Help from another person toileting, which includes using toliet, bedpan, or urinal?: Total Help from another person bathing (including washing, rinsing, drying)?: A Lot Help from another person to put on and taking off regular upper body clothing?: A Lot Help from another person to put on and taking off regular lower body clothing?: Total 6 Click Score: 12    End of Session    OT Visit  Diagnosis: Unsteadiness on feet (R26.81);Muscle weakness (generalized) (M62.81)   Activity Tolerance Patient tolerated treatment well   Patient Left in chair;with call bell/phone within reach;with chair alarm set;with nursing/sitter in room (tech in room cleaning linens on bed)   Nurse Communication Mobility status;Precautions        Time: 0539-7673 OT Time Calculation (min): 33 min  Charges: OT General Charges $OT Visit: 1 Visit OT Treatments $Self Care/Home Management : 23-37 mins   Brynn, OTR/L  Acute Rehabilitation Services Office: [phone] .   Mateo Flow 05/23/2022, 10:26 AM

## 2022-05-23 NOTE — Progress Notes (Signed)
    Patient was sleeping comfortably, states pain well controlled. He is eager to progress activity and eager to D/C to SNF. He is pleased with progress in PT and remains motivated. Still awaiting bed offer. Va benefits have come through and now waiting on facility bed.    Physical Exam: Vitals:   05/23/22 1504 05/23/22 1900  BP: 125/86 132/89  Pulse: 75 81  Resp: 15   Temp: 97.8 F (36.6 C) 98.2 F (36.8 C)  SpO2: 97% 99%    Pt resting comfortably in bed, TLSO at bedside NVI  POD #38 s/p L4-L5 decompression and fusion with utilization of BMP for severe spinal stenosis and weakness on 04/13/2022.  Current functional status and weakness appears consistent with patient's baseline preoperatively. Has actually been improving slowly.    Patient historically discharged from the hospital to skilled nursing facility for rehabilitation, however, patient had a very poor experience at this facility and went home with a full time home health aide.  Extremely unfortunately, the aid quit after several hours stating he did not wish to work the long hours and the patient was left alone at home unable to care for himself resulting in his return to the hospital. Patient has stable lower extremity weakness compared to preoperative testing requiring rehabilitation to progress his independence.   - up with PT/OT, encourage ambulation.  Patient would likely benefit from PT/OT two times a day - On baseline home medication of oxycodone with robaxin for muscle spasms  - expected postoperative findings on MRI and CT scan.  No additional surgical intervention is required.  This is currently a social and rehab placement situation - Pending placement by social work  for SNF.  Patient clearly requires extensive physical therapy and rehabilitation and is unable to proceed home safely on his own.  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 Va benefits have cleared and pt waiting on bed.   *Afternoon update from  social work outlines still awaiting to hear back from facility, f/u email has been sent. Hope to D/C wed vs thurs pending bed   - DC to skilled nursing facility when available  - F/U in office at 6 weeks PO

## 2022-05-23 NOTE — Progress Notes (Signed)
Physical Therapy Treatment Patient Details Name: Evan Morgan MRN: [id number] DOB: 06-Jun-1951 Today's Date: 05/23/2022   History of Present Illness 71 year old man admitted on 05/12/22 due to back pain, LE weakness and FTT at home after d/c from SNF to home with aide and his aide quit so presented to [place] then transferred to [facility].  Had been d/c a month ago after stay with severe L 4/5 spinal stenosis. Underwent removal of hardware, decompression L4-5 TLIF, R posterolateral fusion on 04/13/2022. PMH significant for HEP C, HTN, back surgeries x 3.    PT Comments    Patient progressing slowly.  Today seems to have more spasms limiting standing and unable to take steps.  Patient remains motivated, hopeful and even determined.  He stood 4 times today progressing in more erect posture and in duration of stand each time.  He will benefit from as intense as possible therapy at [facility].  PT will follow up acutely if not d/c.    Recommendations for follow up therapy are one component of a multi-disciplinary discharge planning process, led by the attending physician.  Recommendations may be updated based on patient status, additional functional criteria and insurance authorization.  Follow Up Recommendations  Skilled nursing-short term rehab (<3 hours/day) Can patient physically be transported by private vehicle: No   Assistance Recommended at Discharge Frequent or constant Supervision/Assistance  Patient can return home with the following A lot of help with bathing/dressing/bathroom;A lot of help with walking and/or transfers;Assistance with cooking/housework;Assist for transportation   Equipment Recommendations  Other (comment) (TBA)    Recommendations for Other Services       Precautions / Restrictions Precautions Precautions: Fall;Back Required Braces or Orthoses: Spinal Brace Spinal Brace: Thoracolumbosacral orthotic;Applied in sitting position     Mobility  Bed Mobility Overal bed mobility:  Needs Assistance       Supine to sit: Min assist     General bed mobility comments: pt requires heavy use of bed rail and increased the HOB to 25 degrees to push up with R UE    Transfers Overall transfer level: Needs assistance Equipment used: Rolling walker (2 wheels) Transfers: Sit to/from Stand Sit to Stand: Mod assist, From elevated surface, +2 physical assistance           General transfer comment: stood x 4 from EOB each time slightly better with increased upright posture and slightly longer (last 2 times had donned shoes)    Ambulation/Gait               General Gait Details: unable; LE spasms today   Stairs             Wheelchair Mobility    Modified Rankin (Stroke Patients Only)       Balance Overall balance assessment: Needs assistance Sitting-balance support: Feet supported Sitting balance-Leahy Scale: Poor Sitting balance - Comments: requires UE support to keep from LOB Postural control: Posterior lean Standing balance support: Bilateral upper extremity supported, During functional activity, Reliant on assistive device for balance Standing balance-Leahy Scale: Poor Standing balance comment: standing for about 6-8 sec max with RW and mod A of 2 for safety                            Cognition Arousal/Alertness: Awake/alert Behavior During Therapy: WFL for tasks assessed/performed Overall Cognitive Status: Within Functional Limits for tasks assessed  Exercises Other Exercises Other Exercises: stretching to hamstrings, gluts, hip rotators with 20 sec hold    General Comments        Pertinent Vitals/Pain Pain Assessment Faces Pain Scale: Hurts little more Pain Location: chronic back pain Pain Descriptors / Indicators: Aching Pain Intervention(s): Monitored during session, Repositioned    Home Living                          Prior Function             PT Goals (current goals can now be found in the care plan section) Progress towards PT goals: Progressing toward goals    Frequency    Min 3X/week      PT Plan Current plan remains appropriate    Co-evaluation              AM-PAC PT "6 Clicks" Mobility   Outcome Measure  Help needed turning from your back to your side while in a flat bed without using bedrails?: A Lot Help needed moving from lying on your back to sitting on the side of a flat bed without using bedrails?: A Lot Help needed moving to and from a bed to a chair (including a wheelchair)?: Total Help needed standing up from a chair using your arms (e.g., wheelchair or bedside chair)?: Total Help needed to walk in hospital room?: Total Help needed climbing 3-5 steps with a railing? : Total 6 Click Score: 8    End of Session Equipment Utilized During Treatment: Gait belt;Back brace Activity Tolerance: Patient limited by fatigue Patient left: in bed;with call bell/phone within reach   PT Visit Diagnosis: Other abnormalities of gait and mobility (R26.89);Muscle weakness (generalized) (M62.81);Other symptoms and signs involving the nervous system (R29.898)     Time: 1510-1540 PT Time Calculation (min) (ACUTE ONLY): 30 min  Charges:  $Therapeutic Exercise: 8-22 mins $Therapeutic Activity: 8-22 mins                     Evan Morgan, PT Acute Rehabilitation Services Office:[phone] 05/23/2022    Evan Morgan 05/23/2022, 4:53 PM

## 2022-05-24 MED ORDER — OXYCODONE-ACETAMINOPHEN 5-325 MG PO TABS: 1.0000 | ORAL_TABLET | Freq: Four times a day (QID) | ORAL | 0 refills | Status: DC | PRN

## 2022-05-24 MED ORDER — BISACODYL 10 MG RE SUPP
10.0000 mg | Freq: Every day | RECTAL | Status: DC | PRN
  Administered 2022-05-25: 10 mg via RECTAL
  Filled 2022-05-24: qty 1

## 2022-05-24 MED ORDER — METHOCARBAMOL 500 MG PO TABS: 500.0000 mg | ORAL_TABLET | Freq: Four times a day (QID) | ORAL | 2 refills | Status: AC | PRN

## 2022-05-24 NOTE — TOC Progression Note (Signed)
Transition of Care [facility]) - Progression Note    Patient Details  Name: Evan Morgan MRN: [id number] Date of Birth: 01-01-1951  Transition of Care [facility]) CM/SW Contact  Eduard Roux, [place] Phone Number: 05/24/2022, 10:10 AM  Clinical Narrative:     Barrier: no confirmed bed offers -  Received call [facility]- unable to accept patient because he was 100 % service connected- he could be no more than 70%   Contacted:  [facility] [place] [phone]- LVM [facility] Renda Rolls- [phone]- LVM [facility] [phone]- LVM [place] [phone] Sonny Dandy- will review  [facility] -[phone]- LVM  TOC will continue to follow and assist with discharge planning.  Antony Blackbird, MSW, LCSW Clinical Social Worker     Expected Discharge Plan: Skilled Nursing Facility Barriers to Discharge: Continued Medical Work up, English as a second language teacher, SNF Pending bed offer  Expected Discharge Plan and Services Expected Discharge Plan: Skilled Nursing Facility In-house Referral: Clinical Social Work     Living arrangements for the past 2 months: Skilled Nursing Facility Expected Discharge Date: 05/19/22                                     Social Determinants of Health (SDOH) Interventions    Readmission Risk Interventions     No data to display

## 2022-05-24 NOTE — Progress Notes (Signed)
Mobility Specialist Progress Note   05/24/22 1131  Mobility  Activity Transferred from bed to chair  Level of Assistance Moderate assist, patient does 50-74%  Assistive Device Stedy  Activity Response Tolerated well  $Mobility charge 1 Mobility   Received in bed c/o LE's pain(9/10) but agreeable to mobility. MinA to get LE's to EOB w/ pt able to support trunk w/ bed railing. Mod A to stand in stedy d/t LE's weakness and requiring increased time to fully stand. Transferred to chair w/o fault, left call bell in reach and left chair alarm on. Notified RN about pain.    Frederico Hamman Mobility Specialist MS [facility] #:  [phone] Acute Rehab Office:  [phone]

## 2022-05-24 NOTE — Progress Notes (Signed)
    Patient stable with slow steady gains in PT/OT. Eager to progress to facility but still no bed offers. Social work is diligently attempting to find placement. Pt c/o some constipation today and requests suppository order.    Physical Exam: Vitals:   05/24/22 0305 05/24/22 0758  BP: 102/66 124/78  Pulse: 72 71  Resp: 18 14  Temp: 98.4 F (36.9 C) 98 F (36.7 C)  SpO2: 96% 98%    Pt sitting up in chair comfortably in TLSO brace NVI  POD #38 s/p L4-L5 decompression and fusion with utilization of BMP for severe spinal stenosis and weakness on 04/13/2022.  Current functional status and weakness appears consistent with patient's baseline preoperatively. Has actually been improving slowly.    Patient historically discharged from the hospital to skilled nursing facility for rehabilitation, however, patient had a very poor experience at this facility and went home with a full time home health aide.  Extremely unfortunately, the aid quit after several hours stating he did not wish to work the long hours and the patient was left alone at home unable to care for himself resulting in his return to the hospital. Patient has stable lower extremity weakness compared to preoperative testing requiring rehabilitation to progress his independence.   - up with PT/OT, encourage ambulation.  Patient would likely benefit from PT/OT two times a day  - On baseline home medication of oxycodone with robaxin for muscle spasms   - expected postoperative findings on MRI and CT scan.  No additional surgical intervention is required.  This is currently a social and rehab placement situation  - Pending placement by social work  for SNF.  Patient clearly requires extensive physical therapy and rehabilitation and is unable to proceed home safely on his own.  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 Va benefits have cleared and pt waiting on bed.   - DC to skilled nursing facility when available   - F/U in  office at 6 weeks PO  today with f/u in 2 weeks

## 2022-05-24 NOTE — TOC Progression Note (Signed)
Transition of Care [facility]) - Progression Note    Patient Details  Name: Evan Morgan MRN: [id number] Date of Birth: 12/22/1950  Transition of Care [facility]) CM/SW [place], [place] Phone Number: 05/24/2022, 11:33 AM  Clinical Narrative:     CSW met with patient, informed [facility] states they can not offer- informed been very challenging to find a 3-4 star SNF that is agreeable to accept VA [employer] Benefits. CSW explained attempting to find placement w/ VA benefits and his Medicare Benefits. [facility] reports they can admit if approved by [facility]. Patient is agreeable to short term rehab at [facility]. CSW submitted additional clinicals to [facility]- pending decision  TOC will continue to follow and assist with discharge planning.  Thurmond Butts, MSW, LCSW Clinical Social Worker    Expected Discharge Plan: Skilled Nursing Facility Barriers to Discharge: Continued Medical Work up, Ship broker, SNF Pending bed offer  Expected Discharge Plan and Services Expected Discharge Plan: [place] In-house Referral: Clinical Social Work     Living arrangements for the past 2 months: [place] Expected Discharge Date: 05/19/22                                     Social Determinants of Health (SDOH) Interventions    Readmission Risk Interventions     No data to display

## 2022-05-24 NOTE — TOC Progression Note (Signed)
Transition of Care [facility]) - Progression Note    Patient Details  Name: Evan Morgan MRN: [id number] Date of Birth: Dec 23, 1950  Transition of Care [facility]) CM/SW Contact  Eduard Roux, [place] Phone Number: 05/24/2022, 1:51 PM  Clinical Narrative:     Myriam Forehand - verified they have received clinicals  - CSW unable to talk with [facility] case manager- left contact information  Expected Discharge Plan: Skilled Nursing Facility Barriers to Discharge: Continued Medical Work up, English as a second language teacher, SNF Pending bed offer  Expected Discharge Plan and Services Expected Discharge Plan: Skilled Nursing Facility In-house Referral: Clinical Social Work     Living arrangements for the past 2 months: Skilled Nursing Facility Expected Discharge Date: 05/19/22                                     Social Determinants of Health (SDOH) Interventions    Readmission Risk Interventions     No data to display

## 2022-05-24 NOTE — TOC Progression Note (Signed)
Transition of Care [facility]) - Progression Note    Patient Details  Name: Evan Morgan MRN: [id number] Date of Birth: [birth year]  Transition of Care [facility]) CM/SW Contact  Eduard Roux, [place] Phone Number: 05/24/2022, 3:20 PM  Clinical Narrative:     Patient was agreeable to [facility] but once they came to do paperwork he refused. He requested to go to [place]  rehab in [place]. He states his brother died at [facility] - he was not aware of the name of the facility when he agreed to go. Patient was persistent on going to [place] [facility]- conference call was made with RNCM,SW, VA SW and patient's daughter. VA SW explained to patient the approval process again and explained his eligible SNF benefits.   Patient states he is agreeable to go to another SNF. CSW contacted [facility]- requested they review  Once placement has been confirmed , contact  Tomasa Hose [phone]  or [email or web address] ) to discuss the process for obtaining the authorization for SNF.   TOC will continue to follow and assist with discharge planning.  Antony Blackbird, MSW, LCSW Clinical Social Worker    Expected Discharge Plan: Skilled Nursing Facility Barriers to Discharge: Continued Medical Work up, English as a second language teacher, SNF Pending bed offer  Expected Discharge Plan and Services Expected Discharge Plan: Skilled Nursing Facility In-house Referral: Clinical Social Work     Living arrangements for the past 2 months: Skilled Nursing Facility Expected Discharge Date: 05/19/22                                     Social Determinants of Health (SDOH) Interventions    Readmission Risk Interventions     No data to display

## 2022-05-25 NOTE — Progress Notes (Signed)
Really not much new to report.  Patient continues to deny any significant pain.  His physical examination is unchanged.  BP 93/70 (BP Location: Right Arm)   Pulse 73   Temp 98.7 F (37.1 C) (Oral)   Resp 18   SpO2 96%   As previously documented, patient does continue to recover from his surgery, and does continue to report ongoing strength gains.  Social work is continuing to attempt to secure a bed at a skilled nursing facility.  Will transfer as soon as this gets accomplished.  In the meantime, he will continue to proceed with physical therapy, and occupational Therapy.

## 2022-05-25 NOTE — Plan of Care (Signed)

## 2022-05-25 NOTE — Progress Notes (Signed)
OT Cancellation Note  Patient Details Name: OSTIN MATHEY MRN: [id number] DOB: Mar 25, 1951   Cancelled Treatment:    Reason Eval/Treat Not Completed: Other (comment).  Patient just returned to bed.    Legaci Tarman D Jasier Calabretta 05/25/2022, 4:49 PM 05/25/2022  RP, OTR/L  Acute Rehabilitation Services  Office:  [phone]

## 2022-05-25 NOTE — Progress Notes (Addendum)
Pt medically stable for dc with dc order entered. SW met with pt and provided current SNF offers that have been confirmed ([facility], [facility]). Denial received from [facility], pt aware. Pt reports he has been to [facility] in the past and does not want this facility again. Pt has accepted a private room at [facility].   Confirmed with Janie at [facility] they are prepared to admit pt today. Admission paperwork complete and sent to [facility]. Spoke to [employer] [phone] at the [facility] who confirmed pt has been approved for 30 day SNF stay under [employer]. [facility] contract has been emailed to [facility] by the [place], Narda [facility]utherford confirmed it has been received.   Pt states that he is not discharging until tomorrow. Confirmed with Narda Rutherford, they are able to accept pt tomorrow. TOC leadership Brewing technologist updated.   Wandra Feinstein, MSW, LCSW [phone] (coverage)

## 2022-05-26 NOTE — Progress Notes (Signed)
Physical Therapy Treatment Patient Details Name: Evan Morgan MRN: [id number] DOB: 08-Jul-1951 Today's Date: 05/26/2022   History of Present Illness 71 year old man admitted on 05/12/22 due to back pain, LE weakness and FTT at home after d/c from SNF to home with aide and his aide quit so presented to [place] then transferred to [facility].  Had been d/c a month ago after stay with severe L 4/5 spinal stenosis. Underwent removal of hardware, decompression L4-5 TLIF, R posterolateral fusion on 04/13/2022. PMH significant for HEP C, HTN, back surgeries x 3.    PT Comments    Patient seen with plan for LE stretching and up to bathroom working on standing with Stedy, but interrupted as transport arrived to take pt to rehab earlier than expected.  Still with significant extensor tone in LE's and needing prolonged static stretch to reduce.  Feel he would benefit from medication for management of spacticity.  PT to continue at SNF at d/c.     Recommendations for follow up therapy are one component of a multi-disciplinary discharge planning process, led by the attending physician.  Recommendations may be updated based on patient status, additional functional criteria and insurance authorization.  Follow Up Recommendations  Skilled nursing-short term rehab (<3 hours/day) Can patient physically be transported by private vehicle: No   Assistance Recommended at Discharge Frequent or constant Supervision/Assistance  Patient can return home with the following A lot of help with bathing/dressing/bathroom;A lot of help with walking and/or transfers;Assistance with cooking/housework;Assist for transportation   Equipment Recommendations  Other (comment) (TBA)    Recommendations for Other Services       Precautions / Restrictions Precautions Precautions: Back;Fall Required Braces or Orthoses: Spinal Brace Spinal Brace: Thoracolumbosacral orthotic;Applied in sitting position Restrictions Weight Bearing Restrictions:  Yes RLE Weight Bearing: Weight bearing as tolerated LLE Weight Bearing: Weight bearing as tolerated     Mobility  Bed Mobility Overal bed mobility: Needs Assistance Bed Mobility: Supine to Sit     Supine to sit: Min assist, HOB elevated Sit to supine: Mod assist   General bed mobility comments: using rail and assist for trunk to sit up, to supine assist for legs and to reposition trunk    Transfers                   General transfer comment: NT as transport arrived to take pt to rehab    Ambulation/Gait                   Stairs             Wheelchair Mobility    Modified Rankin (Stroke Patients Only)       Balance Overall balance assessment: Needs assistance   Sitting balance-Leahy Scale: Poor Sitting balance - Comments: UE support on EOB for balance, assist to don/doff brace                                    Cognition Arousal/Alertness: Awake/alert Behavior During Therapy: WFL for tasks assessed/performed Overall Cognitive Status: Within Functional Limits for tasks assessed                                          Exercises Other Exercises Other Exercises: LE stretching in supine 30 sec each to heel cords, hamstrings,  gluts and hooklying hip abd/add AROM for tone inhibition    General Comments General comments (skin integrity, edema, etc.): assisted NT to pack items as transport arrived to take pt to SNF      Pertinent Vitals/Pain Pain Assessment Faces Pain Scale: Hurts little more Pain Location: chronic back pain Pain Descriptors / Indicators: Aching Pain Intervention(s): Monitored during session, Premedicated before session    Home Living                          Prior Function            PT Goals (current goals can now be found in the care plan section) Progress towards PT goals: Progressing toward goals    Frequency    Min 3X/week      PT Plan Current plan remains  appropriate    Co-evaluation              AM-PAC PT "6 Clicks" Mobility   Outcome Measure  Help needed turning from your back to your side while in a flat bed without using bedrails?: A Lot Help needed moving from lying on your back to sitting on the side of a flat bed without using bedrails?: A Lot Help needed moving to and from a bed to a chair (including a wheelchair)?: Total Help needed standing up from a chair using your arms (e.g., wheelchair or bedside chair)?: Total Help needed to walk in hospital room?: Total Help needed climbing 3-5 steps with a railing? : Total 6 Click Score: 8    End of Session Equipment Utilized During Treatment: Back brace Activity Tolerance: Other (comment) (limited by being d/c'd earlier than expected)     PT Visit Diagnosis: Other abnormalities of gait and mobility (R26.89);Muscle weakness (generalized) (M62.81);Other symptoms and signs involving the nervous system (R29.898)     Time: 9798-9211 PT Time Calculation (min) (ACUTE ONLY): 18 min  Charges:  $Therapeutic Exercise: 8-22 mins                     Sheran Lawless, PT Acute Rehabilitation Services Office:[phone] 05/26/2022    Evan Morgan 05/26/2022, 10:45 AM

## 2022-05-26 NOTE — Progress Notes (Signed)
No new events. No change in exam.  BP 114/80 (BP Location: Right Arm)   Pulse 71   Temp 98.4 F (36.9 C) (Oral)   Resp 18   SpO2 97%   Patient will be discharged to SNF today with f/u in 2 weeks.

## 2022-05-26 NOTE — TOC Transition Note (Signed)
Transition of Care [facility]) - CM/SW Discharge Note   Patient Details  Name: Evan Morgan MRN: [id number] Date of Birth: 1951/03/20  [facility] of [facility] CM/SW Contact:  Eduard Roux, LCSW Phone Number: 05/26/2022, 9:55 AM   Clinical Narrative:     Patient will Discharge to: [facility] Discharge Date: 05/26/2022 Family Notified: Transport By: Sharin Mons  Per MD patient is ready for discharge. RN, patient, and facility notified of discharge. Discharge Summary sent to facility. RN given number for [phone]. Ambulance transport requested for patient.   Clinical Social Worker signing off.  Antony Blackbird, MSW, LCSW Clinical Social Worker     Final next level of care: Skilled Nursing Facility Barriers to Discharge: Barriers Resolved   Patient Goals and CMS Choice        Discharge Placement              Patient chooses bed at: [facility] Patient to be transferred to facility by: [facility]   Patient and family notified of of transfer: 05/26/22  Discharge Plan and Services In-house Referral: Clinical Social Work                                   Social Determinants of Health (SDOH) Interventions     Readmission Risk Interventions     No data to display

## 2022-05-26 NOTE — Progress Notes (Signed)
Report called to  [employer] at [place]. Pt transferred via [facility].

## 2022-09-21 ENCOUNTER — Encounter: Payer: Self-pay | Admitting: Neurology

## 2022-09-27 ENCOUNTER — Other Ambulatory Visit (HOSPITAL_COMMUNITY): Payer: Self-pay | Admitting: Orthopedic Surgery

## 2022-09-27 DIAGNOSIS — M545 Low back pain, unspecified: Secondary | ICD-10-CM

## 2022-10-04 ENCOUNTER — Ambulatory Visit (HOSPITAL_COMMUNITY): Admission: RE | Admit: 2022-10-04 | Payer: Medicare PPO | Source: Ambulatory Visit

## 2022-10-04 ENCOUNTER — Encounter (HOSPITAL_COMMUNITY): Payer: Self-pay

## 2022-10-04 ENCOUNTER — Ambulatory Visit (HOSPITAL_COMMUNITY): Payer: Medicare PPO

## 2022-10-12 ENCOUNTER — Ambulatory Visit (HOSPITAL_COMMUNITY)
Admission: RE | Admit: 2022-10-12 | Discharge: 2022-10-12 | Disposition: A | Discharge status: Home / Self Care | Payer: Medicare PPO | Source: Ambulatory Visit | Attending: Orthopedic Surgery | Admitting: Orthopedic Surgery

## 2022-10-12 DIAGNOSIS — M545 Low back pain, unspecified: Secondary | ICD-10-CM | POA: Diagnosis present

## 2022-10-26 NOTE — Progress Notes (Deleted)
Initial neurology clinic note  SERVICE DATE: 11/02/22  Reason for Evaluation: Consultation requested by Phylliss Bob, MD for an opinion regarding bilateral upper and lower extremity weakness. My final recommendations will be communicated back to the requesting physician by way of shared medical record or letter to requesting physician via [place] mail.  HPI: This is [name], a 72 y.o. ***-handed male with a medical history of HTN, cervical and lumbar radiculopathy s/p L4-5 decompression and fusion (04/13/2022)*** who presents to neurology clinic with the chief complaint of weakness. The patient is accompanied by ***.  *** Patient had L4-L5 decompression and fusion on 04/13/22. Per note from [facility] from 09/18/22, patient has been getting weaker and weaker since surgery though. He is not getting any routine PT. He has persistent back pain as well. He takes oxycodone for this. He appeared very deconditioned at this visit.  Upper and lower extremity progressive weakness. Hand cramps - not explained by cervical imaging Now in a wheelchair, unable to ambulate on own Has pain in shoulders, back, and legs  On gabapentin 900 mg qhs  The patient has not*** had similar episodes of symptoms in the past. ***  Muscle bulk loss? *** Muscle pain? ***  Cramps/Twitching? *** Suggestion of myotonia/difficulty relaxing after contraction? ***  Fatigable weakness?*** Does strength improve after brief exercise?***  Able to brush hair/teeth without difficulty? *** Able to button shirts/use zips? *** Clumsiness/dropping grasped objects?*** Can you arise from squatted position easily? *** Able to get out of chair without using arms? *** Able to walk up steps easily? *** Use an assistive device to walk? *** Significant imbalance with walking? *** Falls?*** Any change in urine color, especially after exertion/physical activity? ***  The patient denies*** symptoms suggestive of oculobulbar  weakness including diplopia, ptosis, dysphagia, poor saliva control, dysarthria/dysphonia, impaired mastication, facial weakness/droop.  There are no*** neuromuscular respiratory weakness symptoms, particularly orthopnea>dyspnea.   Pseudobulbar affect is absent***.  The patient does not*** report symptoms referable to autonomic dysfunction including impaired sweating, heat or cold intolerance, excessive mucosal dryness, gastroparetic early satiety, postprandial abdominal bloating, constipation, bowel or bladder dyscontrol, erectile dysfunction*** or syncope/presyncope/orthostatic intolerance.  There are no*** complaints relating to other symptoms of small fiber modalities including paresthesia/pain.  The patient has not *** noticed any recent skin rashes nor does he*** report any constitutional symptoms like fever, night sweats, anorexia or unintentional weight loss.  EtOH use: ***  Restrictive diet? *** Family history of neuropathy/myopathy/NM disease?***  Previous labs, electrodiagnostics, and neuroimaging are summarized below, but pertinent findings include***  Any biopsy done? *** Current medications being tried for the patient's symptoms include ***  Prior medications that have been tried: ***   MEDICATIONS:  Outpatient Encounter Medications as of 11/02/2022  Medication Sig   acetaminophen (TYLENOL) 500 MG tablet Take 1,000 mg by mouth every 6 (six) hours as needed for moderate pain.   atenolol (TENORMIN) 50 MG tablet Take 50 mg by mouth daily.    Cholecalciferol 50 MCG (2000 UT) TABS Take 2,000 Units by mouth daily.    DULCOLAX 10 MG suppository Place 10 mg rectally daily as needed for constipation.   finasteride (PROSCAR) 5 MG tablet Take 5 mg by mouth daily.   gabapentin (NEURONTIN) 300 MG capsule Take 900 mg by mouth at bedtime.   hydrALAZINE (APRESOLINE) 10 MG tablet Take 10 mg by mouth daily.   hydrochlorothiazide (HYDRODIURIL) 25 MG tablet Take 25 mg by mouth daily.     methocarbamol (ROBAXIN) 500  MG tablet Take 1-2 tablets (500-1,000 mg total) by mouth every 6 (six) hours as needed for muscle spasms.   nystatin-triamcinolone ointment (MYCOLOG) Apply 1 Application topically daily as needed for dry skin.   ondansetron (ZOFRAN) 4 MG tablet Take 1 tablet (4 mg total) by mouth every 6 (six) hours as needed for nausea.   oxyCODONE-acetaminophen (PERCOCET/ROXICET) 5-325 MG tablet Take 1-2 tablets by mouth every 6 (six) hours as needed for moderate pain or severe pain.   SENNA LAXATIVE 8.6 MG tablet Take 1 tablet by mouth 2 (two) times daily. Hold for loose stools.   tamsulosin (FLOMAX) 0.4 MG CAPS capsule Take 0.8 mg by mouth daily.   No facility-administered encounter medications on file as of 11/02/2022.    PAST MEDICAL HISTORY: Past Medical History:  Diagnosis Date   Arthritis    knees   Asthma    As a child   History of hepatitis C    Hypertension     PAST SURGICAL HISTORY: Past Surgical History:  Procedure Laterality Date   ALLOGRAFT APPLICATION Left [id number]   Procedure: ALLOGRAFT APPLICATION;  Surgeon: Phylliss Bob, MD;  Location: [facility];  Service: Orthopedics;  Laterality: Left;   ANTERIOR LAT LUMBAR FUSION  07/23/2019   Procedure: LUMBAR TWO-THREE LEFT LATERAL INTERBODY FUSION WITH INSTRUMENTATION AND ALLOGRAFT;  Surgeon: Phylliss Bob, MD;  Location: [facility];  Service: Orthopedics;;   BACK SURGERY     x3   TRANSFORAMINAL LUMBAR INTERBODY FUSION (TLIF) WITH PEDICLE SCREW FIXATION 1 LEVEL Left 04/13/2022   Procedure: HARDWARE REMOVAL  LEFT-SIDED LUMBAR 4 - LUMBAR 5 TRANSFORAMINAL LUMBAR INTERBODY FUSION AND DECOMPRESSION WITH INSTRUMENTATION;  Surgeon: Phylliss Bob, MD;  Location: [facility];  Service: Orthopedics;  Laterality: Left;    ALLERGIES: Allergies  Allergen Reactions   Amlodipine Other (See Comments)    Unknown reaction    Codeine Nausea And Vomiting   Lisinopril Cough    FAMILY HISTORY: Family History  Problem Relation Age of  Onset   Colon cancer Neg Hx    Esophageal cancer Neg Hx    Rectal cancer Neg Hx    Stomach cancer Neg Hx     SOCIAL HISTORY: Social History   Tobacco Use   Smoking status: Never   Smokeless tobacco: Never  Vaping Use   Vaping Use: Never used  Substance Use Topics   Alcohol use: Not Currently    Comment: maybe a fifth per week, not all of the time   Drug use: Yes    Frequency: 4.0 times per week    Types: Marijuana    Comment: 3-4 times per week   Social History   Social History Narrative   Not on file     OBJECTIVE: PHYSICAL EXAM: There were no vitals taken for this visit.  General:*** General appearance: Awake and alert. No distress. Cooperative with exam.  Skin: No obvious rash or jaundice. HEENT: Atraumatic. Anicteric. Lungs: Non-labored breathing on room air  Heart: Regular Abdomen: Soft, non tender. Extremities: No edema. No obvious deformity.  Musculoskeletal: No obvious joint swelling. Psych: Affect appropriate.  Neurological: Mental Status: Alert. Speech fluent. No pseudobulbar affect Cranial Nerves: CNII: No RAPD. Visual fields grossly intact. CNIII, IV, VI: PERRL. No nystagmus. EOMI. CN V: Facial sensation intact bilaterally to fine touch. [place] clench strong. Jaw jerk***. CN VII: Facial muscles symmetric and strong. No ptosis at rest or after sustained upgaze***. CN VIII: Hearing grossly intact bilaterally. CN IX: No hypophonia. CN X: Palate elevates symmetrically. CN XI: Full  strength shoulder shrug bilaterally. CN XII: Tongue protrusion full and midline. No atrophy or fasciculations. No significant dysarthria*** Motor: Tone is ***. *** fasciculations in *** extremities. *** atrophy. No grip or percussive myotonia.***  Individual muscle group testing (MRC grade out of 5):  Movement     Neck flexion ***    Neck extension ***     Right Left   Shoulder abduction *** ***   Shoulder adduction *** ***   Shoulder ext rotation *** ***    Shoulder int rotation *** ***   Elbow flexion *** ***   Elbow extension *** ***   Wrist extension *** ***   Wrist flexion *** ***   Finger abduction - FDI *** ***   Finger abduction - ADM *** ***   Finger extension *** ***   Finger distal flexion - 2/3 *** ***   Finger distal flexion - 4/5 *** ***   Thumb flexion - FPL *** ***   Thumb abduction - APB *** ***    Hip flexion *** ***   Hip extension *** ***   Hip adduction *** ***   Hip abduction *** ***   Knee extension *** ***   Knee flexion *** ***   Dorsiflexion *** ***   Plantarflexion *** ***   Inversion *** ***   Eversion *** ***   Great toe extension *** ***   Great toe flexion *** ***     Reflexes:  Right Left   Bicep *** ***   Tricep *** ***   BrRad *** ***   Knee *** ***   Ankle *** ***    Pathological Reflexes: Babinski: *** response bilaterally*** Hoffman: *** Troemner: *** Pectoral: *** Palmomental: *** Facial: *** Midline tap: *** Sensation: Pinprick: *** Vibration: *** Temperature: *** Proprioception: *** Coordination: Intact finger-to- nose-finger bilaterally. Romberg negative.*** Gait: Able to rise from chair with arms crossed unassisted. Normal, narrow-based gait. Able to tandem walk. Able to walk on toes and heels.***  Lab and Test Review: Internal labs: 05/13/22: CRP: 6.3 ESR: 54 CBC significant for thrombocytopenia (plts 127) BMP significant for hypokalemia (K 3.1) ***  Imaging: MRI lumbar spine wo contrast (10/12/22): FINDINGS: Segmentation: Standard; the lowest formed disc space is designated L5-S1.   Alignment:  Normal.   Vertebrae: Vertebral body heights are preserved. Background marrow signal is normal. Postsurgical changes reflecting posterior instrumented fusion from L3 through L5 with interbody spacers at L2-L3 through L4-L5, bilateral laminectomy at L3-L4, and left laminectomy at L4-L5 are noted. There are ghost tracks from prior L2 pedicle screws. There is no  evidence of osseous fusion across the disc spaces on the same-day CT. Perihardware lucency around the L5 pedicle screws is better seen on the CT.   There is edema within the vertebral bodies at L4 and L5 which is increased since the prior MRI. There is no convincing evidence of infection.   There is no suspicious marrow signal abnormality.   Conus medullaris and cauda equina: Conus extends to the L1 level. Conus and cauda equina appear normal.   Paraspinal and other soft tissues: Bilateral renal cysts are noted requiring no specific imaging follow-up. Postsurgical changes are noted in the soft tissues posteriorly in the lower lumbar spine. Previously seen fluid in the soft tissues has essentially resolved.   Disc levels:   T12-L1: No significant spinal canal or neural foraminal stenosis.   L1-L2: No significant spinal canal or neural foraminal stenosis.   L2-L3: Previous interbody fusion without significant spinal canal or neural  foraminal stenosis.   L3-L4: Status post posterior instrumented fusion and decompression. There is unchanged T2 hyperintense material in the dorsal aspect of the canal measuring up to 5 mm in thickness favored to reflect granulation tissue based on the prior contrast enhanced lumbar spine MRI. There is unchanged degenerative endplate spurring and right eccentric disc bulge. Findings result in unchanged mild spinal canal stenosis and moderate to severe right worse than left neural foraminal stenosis.   L4-L5: Status post posterior instrumented fusion and left laminectomy. There is a persistent disc bulge, degenerative endplate spurring, and bulky facet arthropathy with ligamentum flavum thickening resulting in severe spinal canal stenosis with impingement of the cauda equina nerve roots, and moderate to severe right and moderate left neural foraminal stenosis.   L5-S1: There is left worse than right endplate spurring and facet arthropathy resulting  in unchanged moderate to severe left and mild-to-moderate right neural foraminal stenosis without significant spinal canal stenosis.   IMPRESSION: 1. Postsurgical changes reflecting lumbar instrumented fusion and decompression as above. Lucency around the bilateral L5 pedicle screws is better seen on the same-day CT. 2. Edema in the L4 and L5 vertebral bodies without osseous fusion on the CT raises the possibility of pseudoarthrosis. Infection is not entirely excluded. 3. Persistent severe spinal canal stenosis with impingement of the cauda equina nerve roots at L4-L5, and moderate to severe right and moderate left neural foraminal stenosis at this level. 4. Unchanged mild spinal canal stenosis and moderate to severe right worse than left neural foraminal stenosis at L3-L4, and moderate to severe left and mild-to-moderate right neural foraminal stenosis at L5-S1.  MRI cervical and thoracic spine wo contrast (05/13/22): FINDINGS: MRI CERVICAL SPINE FINDINGS   Alignment: Examination mildly degraded by motion artifact.   Trace degenerative anterolisthesis of C6 on C7 and C7 on T1. Alignment otherwise normal preservation of the normal cervical lordosis.   Vertebrae: Vertebral body height maintained without acute or chronic fracture. Bone marrow signal intensity within normal limits. No discrete or worrisome osseous lesions. No abnormal marrow edema.   Cord: Normal signal and morphology. No convincing cord signal changes on this mildly motion degraded exam.   Posterior Fossa, vertebral arteries, paraspinal tissues: Visualized brain and posterior fossa within normal limits. Craniocervical junction within normal limits. Paraspinous soft tissues demonstrate no acute finding. Markedly hypoplastic left vertebral artery with poorly visualized flow void, possibly occluded, stable from prior. Preserved flow voids seen within the contralateral dominant right vertebral artery. Incidental  note made of a small 4 mm cystic lesion at the right upper neck (series 4, image 10), indeterminate, but stable from prior, and of doubtful significance.   Disc levels:   C2-C3: Minimal disc bulge. Moderate left with mild right facet hypertrophy. No significant spinal stenosis. Mild left C3 foraminal narrowing. Right neural foramen remains patent.   C3-C4: Broad-based posterior disc bulge indents the ventral thecal sac. Mild facet and ligament flavum hypertrophy. Resultant mild spinal stenosis. Foramina remain patent.   C4-C5: Broad-based central disc protrusion indents the ventral thecal sac (series 5, image 22). Mild facet and ligament flavum hypertrophy. Mild spinal stenosis. Superimposed bilateral uncovertebral spurring with resultant mild left with mild-to-moderate right C5 foraminal stenosis.   C5-C6: Central disc protrusion indents the ventral thecal sac, contacting the ventral cord (series 5, image 27). Minimal cord flattening without cord signal changes. Superimposed facet and ligament flavum hypertrophy. Resultant mild to moderate spinal stenosis. Severe left with moderate right C6 foraminal narrowing.   C6-C7: Central to right paracentral  disc protrusion (series 4, image 31). Superimposed bilateral facet and uncovertebral hypertrophy. No significant spinal stenosis. Moderate left with mild right C7 foraminal stenosis.   C7-T1: Trace anterolisthesis. Mild disc bulge with bilateral facet hypertrophy. No spinal stenosis. Mild bilateral C8 foraminal narrowing.   MRI THORACIC SPINE FINDINGS   Alignment: Examination technically limited as the patient was unable to tolerate the full length of the exam. No axial images were performed. Additionally, the provided images are degraded by motion artifact.   Vertebral bodies normally aligned with preservation of the normal thoracic kyphosis. No significant listhesis.   Vertebrae: Vertebral body height maintained without  acute or chronic fracture. Bone marrow signal intensity within normal limits. No worrisome osseous lesions. No definite abnormal marrow edema, although the STIR sequence is markedly degraded by motion.   Cord:  Grossly normal signal and morphology on this limited exam.   Paraspinal and other soft tissues: Postoperative changes partially visualized within the upper posterior lumbar soft tissues. Paraspinous soft tissues demonstrate no other definite abnormality.   Disc levels:   T5-6: Probable central disc extrusion with superior migration (series 19, image 10). No significant spinal stenosis. Foramina remain patent.   C6-7: Disc bulge with probable small central disc protrusion and annular fissure. No significant spinal stenosis. Foramina remain patent.   T7-8: Disc desiccation with mild disc bulge. No significant stenosis.   T8-9: Disc desiccation with mild disc bulge. No significant stenosis.   T9-10: Disc desiccation with mild disc bulge. Mild facet hypertrophy. No significant spinal stenosis. Mild bilateral foraminal narrowing.   T10-11: Central disc protrusion indents the ventral thecal sac (series 19, image 10). Probable minimal flattening of the ventral cord without definite cord signal changes or significant spinal stenosis. Foramina appear patent.   Otherwise, no other significant disc pathology seen within the thoracic spine on this limited exam. No other appreciable stenosis or neural impingement.   IMPRESSION: MRI CERVICAL SPINE IMPRESSION:   1. No acute abnormality within the cervical spine. 2. Multilevel cervical spondylosis with resultant diffuse spinal stenosis at C3-4 through C5-6, most pronounced at C5-6 where stenosis is mild to moderate in nature. 3. Multifactorial degenerative changes with resultant multilevel foraminal narrowing as above. Notable findings include mild-to-moderate right C5 foraminal stenosis, severe left with moderate right C6  foraminal narrowing, with moderate left C7 foraminal narrowing. 4. 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   MRI THORACIC SPINE IMPRESSION:   1. Technically limited exam due to motion artifact and the patient's inability to tolerate the full length of the study. No axial images were obtained. 2. No acute abnormality within the thoracic spine. 3. Multilevel spondylosis at T5-6 through T10-11 as above, which could contribute to mid back pain. No significant spinal stenosis. Mild bilateral foraminal narrowing at T9-10.  ASSESSMENT: Evan Morgan is a 72 y.o. male who presents for evaluation of ***. *** has a relevant medical history of ***. *** neurological examination is pertinent for ***. Available diagnostic data is significant for ***. This constellation of symptoms and objective data would most likely localize to ***. ***  PLAN: -Blood work: *** ***  -Return to clinic ***  The impression above as well as the plan as outlined below were extensively discussed with the patient (in the company of ***) who voiced understanding. All questions were answered to their satisfaction.  The patient was counseled on pertinent fall precautions per the printed material provided today, and as noted under the "Patient Instructions" section below.***  When  available, results of the above investigations and possible further recommendations will be communicated to the patient via telephone/MyChart. Patient to call office if not contacted after expected testing turnaround time.   Total time spent reviewing records, interview, history/exam, documentation, and coordination of care on day of encounter:  *** min   Thank you for allowing me to participate in patient's care.  If I can answer any additional questions, I would be pleased to do so.  Kai Levins, MD   CC: Charlsie Merles, [facility] [facility] [place]) [place] [address]  [place]: Referring  provider: Phylliss Bob, MD [address] [place],  [place] [address]

## 2022-10-30 ENCOUNTER — Inpatient Hospital Stay (HOSPITAL_COMMUNITY)
Admission: EM | Admit: 2022-10-30 | Discharge: 2022-11-03 | DRG: 552 | Disposition: A | Discharge status: Skilled Nursing Facility | Payer: No Typology Code available for payment source | Source: Skilled Nursing Facility | Attending: Internal Medicine | Admitting: Internal Medicine

## 2022-10-30 ENCOUNTER — Other Ambulatory Visit: Payer: Self-pay

## 2022-10-30 ENCOUNTER — Encounter (HOSPITAL_COMMUNITY): Payer: Self-pay

## 2022-10-30 DIAGNOSIS — F444 Conversion disorder with motor symptom or deficit: Secondary | ICD-10-CM | POA: Diagnosis present

## 2022-10-30 DIAGNOSIS — M4626 Osteomyelitis of vertebra, lumbar region: Secondary | ICD-10-CM | POA: Diagnosis present

## 2022-10-30 DIAGNOSIS — M549 Dorsalgia, unspecified: Principal | ICD-10-CM

## 2022-10-30 DIAGNOSIS — Z993 Dependence on wheelchair: Secondary | ICD-10-CM

## 2022-10-30 DIAGNOSIS — G8929 Other chronic pain: Secondary | ICD-10-CM | POA: Diagnosis present

## 2022-10-30 DIAGNOSIS — B958 Unspecified staphylococcus as the cause of diseases classified elsewhere: Secondary | ICD-10-CM | POA: Diagnosis present

## 2022-10-30 DIAGNOSIS — R32 Unspecified urinary incontinence: Secondary | ICD-10-CM | POA: Diagnosis present

## 2022-10-30 DIAGNOSIS — R269 Unspecified abnormalities of gait and mobility: Secondary | ICD-10-CM | POA: Diagnosis present

## 2022-10-30 DIAGNOSIS — J4489 Other specified chronic obstructive pulmonary disease: Secondary | ICD-10-CM | POA: Diagnosis present

## 2022-10-30 DIAGNOSIS — Z79899 Other long term (current) drug therapy: Secondary | ICD-10-CM

## 2022-10-30 DIAGNOSIS — I10 Essential (primary) hypertension: Secondary | ICD-10-CM | POA: Diagnosis present

## 2022-10-30 DIAGNOSIS — Z888 Allergy status to other drugs, medicaments and biological substances status: Secondary | ICD-10-CM

## 2022-10-30 DIAGNOSIS — M48061 Spinal stenosis, lumbar region without neurogenic claudication: Secondary | ICD-10-CM | POA: Diagnosis present

## 2022-10-30 DIAGNOSIS — I2510 Atherosclerotic heart disease of native coronary artery without angina pectoris: Secondary | ICD-10-CM | POA: Diagnosis present

## 2022-10-30 DIAGNOSIS — Z981 Arthrodesis status: Secondary | ICD-10-CM

## 2022-10-30 DIAGNOSIS — R768 Other specified abnormal immunological findings in serum: Secondary | ICD-10-CM

## 2022-10-30 DIAGNOSIS — M4640 Discitis, unspecified, site unspecified: Secondary | ICD-10-CM | POA: Diagnosis present

## 2022-10-30 DIAGNOSIS — T847XXA Infection and inflammatory reaction due to other internal orthopedic prosthetic devices, implants and grafts, initial encounter: Secondary | ICD-10-CM

## 2022-10-30 DIAGNOSIS — Z885 Allergy status to narcotic agent status: Secondary | ICD-10-CM

## 2022-10-30 DIAGNOSIS — M4646 Discitis, unspecified, lumbar region: Principal | ICD-10-CM | POA: Diagnosis present

## 2022-10-30 DIAGNOSIS — J4520 Mild intermittent asthma, uncomplicated: Secondary | ICD-10-CM | POA: Diagnosis present

## 2022-10-30 DIAGNOSIS — N400 Enlarged prostate without lower urinary tract symptoms: Secondary | ICD-10-CM | POA: Diagnosis present

## 2022-10-30 DIAGNOSIS — E785 Hyperlipidemia, unspecified: Secondary | ICD-10-CM | POA: Diagnosis present

## 2022-10-30 LAB — CBC WITH DIFFERENTIAL/PLATELET
Abs Immature Granulocytes: 0.01 10*3/uL (ref 0.00–0.07)
Basophils Absolute: 0 10*3/uL (ref 0.0–0.1)
Basophils Relative: 1 %
Eosinophils Absolute: 0.1 10*3/uL (ref 0.0–0.5)
Eosinophils Relative: 3 %
HCT: 43.8 % (ref 39.0–52.0)
Hemoglobin: 14.6 g/dL (ref 13.0–17.0)
Immature Granulocytes: 0 %
Lymphocytes Relative: 45 %
Lymphs Abs: 1.8 10*3/uL (ref 0.7–4.0)
MCH: 29.4 pg (ref 26.0–34.0)
MCHC: 33.3 g/dL (ref 30.0–36.0)
MCV: 88.1 fL (ref 80.0–100.0)
Monocytes Absolute: 0.5 10*3/uL (ref 0.1–1.0)
Monocytes Relative: 12 %
Neutro Abs: 1.5 10*3/uL — ABNORMAL LOW (ref 1.7–7.7)
Neutrophils Relative %: 39 %
Platelets: 123 10*3/uL — ABNORMAL LOW (ref 150–400)
RBC: 4.97 MIL/uL (ref 4.22–5.81)
RDW: 14 % (ref 11.5–15.5)
WBC: 3.9 10*3/uL — ABNORMAL LOW (ref 4.0–10.5)
nRBC: 0 % (ref 0.0–0.2)

## 2022-10-30 LAB — COMPREHENSIVE METABOLIC PANEL
ALT: 15 U/L (ref 0–44)
AST: 21 U/L (ref 15–41)
Albumin: 3.6 g/dL (ref 3.5–5.0)
Alkaline Phosphatase: 34 U/L — ABNORMAL LOW (ref 38–126)
Anion gap: 7 (ref 5–15)
BUN: 11 mg/dL (ref 8–23)
CO2: 29 mmol/L (ref 22–32)
Calcium: 9.4 mg/dL (ref 8.9–10.3)
Chloride: 100 mmol/L (ref 98–111)
Creatinine, Ser: 0.88 mg/dL (ref 0.61–1.24)
GFR, Estimated: >60 mL/min (ref >60)
Glucose, Bld: 112 mg/dL — ABNORMAL HIGH (ref 70–99)
Potassium: 3.6 mmol/L (ref 3.5–5.1)
Sodium: 136 mmol/L (ref 135–145)
Total Bilirubin: 0.5 mg/dL (ref 0.3–1.2)
Total Protein: 7.8 g/dL (ref 6.5–8.1)

## 2022-10-30 LAB — LACTIC ACID, PLASMA: Lactic Acid, Venous: 1.1 mmol/L (ref 0.5–1.9)

## 2022-10-30 LAB — PROTIME-INR
INR: 1.1 (ref 0.8–1.2)
Prothrombin Time: 14.1 seconds (ref 11.4–15.2)

## 2022-10-30 LAB — HIV ANTIBODY (ROUTINE TESTING W REFLEX): HIV Screen 4th Generation wRfx: NONREACTIVE

## 2022-10-30 MED ORDER — OXYCODONE-ACETAMINOPHEN 5-325 MG PO TABS
1.0000 | ORAL_TABLET | Freq: Four times a day (QID) | ORAL | Status: DC | PRN
  Administered 2022-10-31 – 2022-11-01 (×3): 2 via ORAL
  Filled 2022-10-30 (×3): qty 2

## 2022-10-30 MED ORDER — HYDRALAZINE HCL 10 MG PO TABS
10.0000 mg | ORAL_TABLET | Freq: Every day | ORAL | Status: DC
  Administered 2022-10-31 – 2022-11-03 (×4): 10 mg via ORAL
  Filled 2022-10-30 (×4): qty 1

## 2022-10-30 MED ORDER — HYDROCHLOROTHIAZIDE 25 MG PO TABS
25.0000 mg | ORAL_TABLET | Freq: Every day | ORAL | Status: DC
  Administered 2022-10-31 – 2022-11-03 (×4): 25 mg via ORAL
  Filled 2022-10-30 (×4): qty 1

## 2022-10-30 MED ORDER — METHOCARBAMOL 500 MG PO TABS
500.0000 mg | ORAL_TABLET | Freq: Four times a day (QID) | ORAL | Status: DC | PRN
  Administered 2022-10-31: 500 mg via ORAL
  Filled 2022-10-30: qty 1

## 2022-10-30 MED ORDER — ATENOLOL 50 MG PO TABS
50.0000 mg | ORAL_TABLET | Freq: Every day | ORAL | Status: DC
  Administered 2022-10-31 – 2022-11-03 (×3): 50 mg via ORAL
  Filled 2022-10-30: qty 1
  Filled 2022-10-30: qty 2
  Filled 2022-10-30 (×2): qty 1

## 2022-10-30 MED ORDER — GABAPENTIN 300 MG PO CAPS
900.0000 mg | ORAL_CAPSULE | Freq: Every day | ORAL | Status: DC
  Administered 2022-10-30 – 2022-11-02 (×4): 900 mg via ORAL
  Filled 2022-10-30 (×4): qty 3

## 2022-10-30 MED ORDER — BISACODYL 10 MG RE SUPP: 10.0000 mg | Freq: Every day | RECTAL | Status: DC | PRN

## 2022-10-30 MED ORDER — ACETAMINOPHEN 500 MG PO TABS: 1000.0000 mg | ORAL_TABLET | Freq: Four times a day (QID) | ORAL | Status: DC | PRN

## 2022-10-30 MED ORDER — ENOXAPARIN SODIUM 40 MG/0.4ML IJ SOSY
40.0000 mg | PREFILLED_SYRINGE | INTRAMUSCULAR | Status: DC
  Administered 2022-10-30 – 2022-11-02 (×4): 40 mg via SUBCUTANEOUS
  Filled 2022-10-30 (×4): qty 0.4

## 2022-10-30 MED ORDER — SENNA 8.6 MG PO TABS
1.0000 | ORAL_TABLET | Freq: Two times a day (BID) | ORAL | Status: DC
  Administered 2022-10-30 – 2022-11-03 (×8): 8.6 mg via ORAL
  Filled 2022-10-30 (×8): qty 1

## 2022-10-30 MED ORDER — ONDANSETRON HCL 4 MG PO TABS: 4.0000 mg | ORAL_TABLET | Freq: Four times a day (QID) | ORAL | Status: DC | PRN

## 2022-10-30 MED ORDER — FINASTERIDE 5 MG PO TABS
5.0000 mg | ORAL_TABLET | Freq: Every day | ORAL | Status: DC
  Administered 2022-10-31 – 2022-11-03 (×4): 5 mg via ORAL
  Filled 2022-10-30 (×4): qty 1

## 2022-10-30 MED ORDER — TAMSULOSIN HCL 0.4 MG PO CAPS
0.8000 mg | ORAL_CAPSULE | Freq: Every day | ORAL | Status: DC
  Administered 2022-10-31 – 2022-11-03 (×4): 0.8 mg via ORAL
  Filled 2022-10-30 (×4): qty 2

## 2022-10-30 NOTE — H&P (Signed)
History and Physical    Evan Morgan DOB: [birth year] DOA: 10/30/2022  PCP: Sherwood Gambler, MD (Confirm with patient/family/NH records and if not entered, this has to be entered at [facility] point of entry) Patient coming from: Rehab  I have personally briefly reviewed patient's old medical records in [facility]  Chief Complaint: Back pain  HPI: Evan Morgan is a 72 y.o. male with medical history significant of severe lumbar spinal stenosis status post multiple surgeries, mild intermittent asthma, HTN, OA, sent from infection disease office for evaluation of worsening back pain.  Patient reported persistent lower back pain since last lumbar surgery in August 2023.  He has been wheelchair-bound since last year and chronic urinary incontinence wears diapers.  He reported at least worsening of back pain for 4-5 weeks, he has been taking oxycodone 4 times a day with minimal relief.  And MRI was done 2 weeks ago showed question of discitis versus osteomyelitis on L4-5 level, patient however reported no fever chills or new neurological problems and patient is not on antibiotics at this point.  Today, patient went to see infectious disease regarding the lumbar MRI study, who recommended patient come to ED.  ED Course: Afebrile, no hypoxia blood pressure stable.  Blood work showed WBC 3.9, creatinine 0.8 K3.6.  Review of Systems: As per HPI otherwise 14 point review of systems negative.    Past Medical History:  Diagnosis Date   Arthritis    knees   Asthma    As a child   History of hepatitis C    Hypertension     Past Surgical History:  Procedure Laterality Date   ALLOGRAFT APPLICATION Left 04/13/2022   Procedure: ALLOGRAFT APPLICATION;  Surgeon: Estill Bamberg, MD;  Location: [facility] OR;  Service: Orthopedics;  Laterality: Left;   ANTERIOR LAT LUMBAR FUSION  07/23/2019   Procedure: LUMBAR TWO-THREE LEFT LATERAL INTERBODY FUSION WITH INSTRUMENTATION AND ALLOGRAFT;  Surgeon:  Estill Bamberg, MD;  Location: [facility] OR;  Service: Orthopedics;;   BACK SURGERY     x3   TRANSFORAMINAL LUMBAR INTERBODY FUSION (TLIF) WITH PEDICLE SCREW FIXATION 1 LEVEL Left 04/13/2022   Procedure: HARDWARE REMOVAL  LEFT-SIDED LUMBAR 4 - LUMBAR 5 TRANSFORAMINAL LUMBAR INTERBODY FUSION AND DECOMPRESSION WITH INSTRUMENTATION;  Surgeon: Estill Bamberg, MD;  Location: [facility] OR;  Service: Orthopedics;  Laterality: Left;     reports that he has never smoked. He has never used smokeless tobacco. He reports that he does not currently use alcohol. He reports current drug use. Frequency: 4.00 times per week. Drug: Marijuana.  Allergies  Allergen Reactions   Amlodipine Other (See Comments)    Unknown reaction    Codeine Nausea And Vomiting   Lisinopril Cough    Family History  Problem Relation Age of Onset   Colon cancer Neg Hx    Esophageal cancer Neg Hx    Rectal cancer Neg Hx    Stomach cancer Neg Hx      Prior to Admission medications   Medication Sig Start Date End Date Taking? Authorizing Provider  acetaminophen (TYLENOL) 500 MG tablet Take 1,000 mg by mouth every 6 (six) hours as needed for moderate pain.    [provider]  atenolol (TENORMIN) 50 MG tablet Take 50 mg by mouth daily.     [provider]  Cholecalciferol 50 MCG (2000 UT) TABS Take 2,000 Units by mouth daily.     [provider]  DULCOLAX 10 MG suppository Place 10 mg rectally  daily as needed for constipation. 05/10/22   [provider]  finasteride (PROSCAR) 5 MG tablet Take 5 mg by mouth daily.    [provider]  gabapentin (NEURONTIN) 300 MG capsule Take 900 mg by mouth at bedtime.    [provider]  hydrALAZINE (APRESOLINE) 10 MG tablet Take 10 mg by mouth daily.    [provider]  hydrochlorothiazide (HYDRODIURIL) 25 MG tablet Take 25 mg by mouth daily.     [provider]  methocarbamol (ROBAXIN) 500 MG tablet Take 1-2 tablets (500-1,000 mg total)  by mouth every 6 (six) hours as needed for muscle spasms. 05/24/22   McKenzie, Lennie Muckle, PA-C  nystatin-triamcinolone ointment (MYCOLOG) Apply 1 Application topically daily as needed for dry skin. 05/10/22   [provider]  ondansetron (ZOFRAN) 4 MG tablet Take 1 tablet (4 mg total) by mouth every 6 (six) hours as needed for nausea. 05/19/22   McKenzie, Lennie Muckle, PA-C  oxyCODONE-acetaminophen (PERCOCET/ROXICET) 5-325 MG tablet Take 1-2 tablets by mouth every 6 (six) hours as needed for moderate pain or severe pain. 05/24/22   McKenzie, Kayla J, PA-C  SENNA LAXATIVE 8.6 MG tablet Take 1 tablet by mouth 2 (two) times daily. Hold for loose stools. 05/10/22   [provider]  tamsulosin (FLOMAX) 0.4 MG CAPS capsule Take 0.8 mg by mouth daily.    [provider]    Physical Exam: Vitals:   10/30/22 1524  BP: (!) 157/84  Pulse: (!) 58  Resp: 18  Temp: 97.8 F (36.6 C)  TempSrc: Oral  SpO2: 100%  Weight: 127 kg  Height: 5\' 11"  (1.803 m)    Constitutional: NAD, calm, comfortable Vitals:   10/30/22 1524  BP: (!) 157/84  Pulse: (!) 58  Resp: 18  Temp: 97.8 F (36.6 C)  TempSrc: Oral  SpO2: 100%  Weight: 127 kg  Height: 5\' 11"  (1.803 m)   Eyes: PERRL, lids and conjunctivae normal ENMT: Mucous membranes are moist. Posterior pharynx clear of any exudate or lesions.Normal dentition.  Neck: normal, supple, no masses, no thyromegaly Respiratory: clear to auscultation bilaterally, no wheezing, no crackles. Normal respiratory effort. No accessory muscle use.  Cardiovascular: Regular rate and rhythm, no murmurs / rubs / gallops. No extremity edema. 2+ pedal pulses. No carotid bruits.  Abdomen: no tenderness, no masses palpated. No hepatosplenomegaly. Bowel sounds positive.  Musculoskeletal: no clubbing / cyanosis. No joint deformity upper and lower extremities. Good ROM, no contractures. Normal muscle tone.  Skin: no rashes, lesions, ulcers. No induration Neurologic: CN  2-12 grossly intact. Sensation intact, DTR normal. Strength 3/5 in bilateral lower extremity.  Psychiatric: Normal judgment and insight. Alert and oriented x 3. Normal mood.     Labs on Admission: I have personally reviewed following labs and imaging studies  CBC: Recent Labs  Lab 10/30/22 1535  WBC 3.9*  NEUTROABS 1.5*  HGB 14.6  HCT 43.8  MCV 88.1  PLT 169*   Basic Metabolic Panel: Recent Labs  Lab 10/30/22 1535  NA 136  K 3.6  CL 100  CO2 29  GLUCOSE 112*  BUN 11  CREATININE 0.88  CALCIUM 9.4   GFR: Estimated Creatinine Clearance: 106.1 mL/min (by C-G formula based on SCr of 0.88 mg/dL). Liver Function Tests: Recent Labs  Lab 10/30/22 1535  AST 21  ALT 15  ALKPHOS 34*  BILITOT 0.5  PROT 7.8  ALBUMIN 3.6   No results for input(s): "LIPASE", "AMYLASE" in the last 168 hours. No results for  input(s): "AMMONIA" in the last 168 hours. Coagulation Profile: Recent Labs  Lab 10/30/22 1535  INR 1.1   Cardiac Enzymes: No results for input(s): "CKTOTAL", "CKMB", "CKMBINDEX", "TROPONINI" in the last 168 hours. BNP (last 3 results) No results for input(s): "PROBNP" in the last 8760 hours. HbA1C: No results for input(s): "HGBA1C" in the last 72 hours. CBG: No results for input(s): "GLUCAP" in the last 168 hours. Lipid Profile: No results for input(s): "CHOL", "HDL", "LDLCALC", "TRIG", "CHOLHDL", "LDLDIRECT" in the last 72 hours. Thyroid Function Tests: No results for input(s): "TSH", "T4TOTAL", "FREET4", "T3FREE", "THYROIDAB" in the last 72 hours. Anemia Panel: No results for input(s): "VITAMINB12", "FOLATE", "FERRITIN", "TIBC", "IRON", "RETICCTPCT" in the last 72 hours. Urine analysis:    Component Value Date/Time   COLORURINE YELLOW 05/13/2022 0115   APPEARANCEUR CLEAR 05/13/2022 0115   LABSPEC >1.046 (H) 05/13/2022 0115   PHURINE 5.0 05/13/2022 0115   GLUCOSEU NEGATIVE 05/13/2022 0115   HGBUR NEGATIVE 05/13/2022 0115   BILIRUBINUR NEGATIVE 05/13/2022  0115   KETONESUR NEGATIVE 05/13/2022 0115   PROTEINUR NEGATIVE 05/13/2022 0115   NITRITE NEGATIVE 05/13/2022 0115   LEUKOCYTESUR NEGATIVE 05/13/2022 0115    Radiological Exams on Admission: No results found.  EKG: Independently reviewed.  Sinus rhythm, no acute ST changes.  Assessment/Plan Principal Problem:   Discitis of lumbar region Active Problems:   Abnormal gait   Discitis  (please populate well all problems here in Problem List. (For example, if patient is on BP meds at home and you resume or decide to hold them, it is a problem that needs to be her. Same for CAD, COPD, HLD and so on)  Acute on chronic ambulation dysfunction with functional paraplegia secondary to subacute-chronic lumbar discitis versus osteomyelitis -On L4-5 level, confirmed with recent lumbar spine MRI. -As planned by infectious disease, IR consulted for putative lumbar spine disc-vertebral body CT-guided biopsy tomorrow, hold off antibiotic for now. -PT evaluation after biopsy, expect patient discharged back to SNF.  HTN -Stable, continue atenolol, hydralazine and HCTZ  BPH -Continue Flomax and Proscar  Chronic back pain -Secondary to severe lumbar spine stenosis complicated with subacute infection described as above.  Continue narcotic therapy and gabapentin  DVT prophylaxis: Lovenox Code Status: Full code Family Communication: None at bedside Disposition Plan: Expect less than 2 midnight hospital stay Consults called: Infectious disease, IR Admission status: MedSurg observation   Lequita Halt MD [facility] Pager [phone]  10/30/2022, 7:09 PM

## 2022-10-30 NOTE — ED Notes (Signed)
Patient transported to room to be cleaned. Patient changed into clean gown, male condom catheter placed.

## 2022-10-30 NOTE — ED Provider Notes (Signed)
[facility] Provider Note   CSN: [id number] Arrival date & time: 10/30/22  1514     History  Chief Complaint  Patient presents with   Bone Infection    Evan Morgan is a 72 y.o. male.  72 year old male with prior medical history as detailed below presents for admission.  Patient is a resident [facility].  Patient with recent surgery of the lumbar spine.  MRI performed approximately 1 and half weeks ago is suggestive of L4-L5 discitis.  Patient known to Dr. Lynann Bologna.  Patient known to Dr. Tommy Medal with ID.  Patient will require admission.  Patient will require IR consultation for possible aspiration.  Per Dr. Tommy Medal, patient is not to receive antibiotics prior to aspiration by IR.  Patient is not a surgical candidate.  Patient is complaining of gradually worsening lower back pain for the last 1 to 2 weeks.  No fevers reported.  The history is provided by the patient and medical records.       Home Medications Prior to Admission medications   Medication Sig Start Date End Date Taking? Authorizing Provider  acetaminophen (TYLENOL) 500 MG tablet Take 1,000 mg by mouth every 6 (six) hours as needed for moderate pain.    [provider]  atenolol (TENORMIN) 50 MG tablet Take 50 mg by mouth daily.     [provider]  Cholecalciferol 50 MCG (2000 UT) TABS Take 2,000 Units by mouth daily.     [provider]  DULCOLAX 10 MG suppository Place 10 mg rectally daily as needed for constipation. 05/10/22   [provider]  finasteride (PROSCAR) 5 MG tablet Take 5 mg by mouth daily.    [provider]  gabapentin (NEURONTIN) 300 MG capsule Take 900 mg by mouth at bedtime.    [provider]  hydrALAZINE (APRESOLINE) 10 MG tablet Take 10 mg by mouth daily.    [provider]  hydrochlorothiazide (HYDRODIURIL) 25 MG tablet Take 25 mg by mouth daily.     [provider]   methocarbamol (ROBAXIN) 500 MG tablet Take 1-2 tablets (500-1,000 mg total) by mouth every 6 (six) hours as needed for muscle spasms. 05/24/22   McKenzie, Lennie Muckle, PA-C  nystatin-triamcinolone ointment (MYCOLOG) Apply 1 Application topically daily as needed for dry skin. 05/10/22   [provider]  ondansetron (ZOFRAN) 4 MG tablet Take 1 tablet (4 mg total) by mouth every 6 (six) hours as needed for nausea. 05/19/22   McKenzie, Lennie Muckle, PA-C  oxyCODONE-acetaminophen (PERCOCET/ROXICET) 5-325 MG tablet Take 1-2 tablets by mouth every 6 (six) hours as needed for moderate pain or severe pain. 05/24/22   McKenzie, Kayla J, PA-C  SENNA LAXATIVE 8.6 MG tablet Take 1 tablet by mouth 2 (two) times daily. Hold for loose stools. 05/10/22   [provider]  tamsulosin (FLOMAX) 0.4 MG CAPS capsule Take 0.8 mg by mouth daily.    [provider]      Allergies    Amlodipine, Codeine, and Lisinopril    Review of Systems   Review of Systems  All other systems reviewed and are negative.   Physical Exam Updated Vital Signs BP (!) 157/84   Pulse (!) 58   Temp 97.8 F (36.6 C) (Oral)   Resp 18   Ht 5\' 11"  (1.803 m)   Wt 127 kg   SpO2 100%   BMI 39.05 kg/m  Physical Exam Vitals and nursing note reviewed.  Constitutional:  General: He is not in acute distress.    Appearance: Normal appearance. He is well-developed.  HENT:     Head: Normocephalic and atraumatic.  Eyes:     Conjunctiva/sclera: Conjunctivae normal.     Pupils: Pupils are equal, round, and reactive to light.  Cardiovascular:     Rate and Rhythm: Normal rate and regular rhythm.     Heart sounds: Normal heart sounds.  Pulmonary:     Effort: Pulmonary effort is normal. No respiratory distress.     Breath sounds: Normal breath sounds.  Abdominal:     General: There is no distension.     Palpations: Abdomen is soft.     Tenderness: There is no abdominal tenderness.  Musculoskeletal:        General: No  deformity. Normal range of motion.     Cervical back: Normal range of motion and neck supple.  Skin:    General: Skin is warm and dry.  Neurological:     General: No focal deficit present.     Mental Status: He is alert and oriented to person, place, and time.     ED Results / Procedures / Treatments   Labs (all labs ordered are listed, but only abnormal results are displayed) Labs Reviewed  CBC WITH DIFFERENTIAL/PLATELET - Abnormal; Notable for the following components:      Result Value   WBC 3.9 (*)    Platelets 123 (*)    Neutro Abs 1.5 (*)    All other components within normal limits  COMPREHENSIVE METABOLIC PANEL - Abnormal; Notable for the following components:   Glucose, Bld 112 (*)    Alkaline Phosphatase 34 (*)    All other components within normal limits  CULTURE, BLOOD (ROUTINE X 2)  CULTURE, BLOOD (ROUTINE X 2)  LACTIC ACID, PLASMA  PROTIME-INR  LACTIC ACID, PLASMA  URINALYSIS, ROUTINE W REFLEX MICROSCOPIC    EKG EKG Interpretation  Date/Time:  Monday October 30 2022 15:39:25 EST Ventricular Rate:  61 PR Interval:  158 QRS Duration: 80 QT Interval:  428 QTC Calculation: 430 R Axis:   49 Text Interpretation: Normal sinus rhythm Normal ECG When compared with ECG of 10-Apr-2022 14:58, PREVIOUS ECG IS PRESENT Confirmed by Kristine Royal [phone]) on 10/30/2022 3:41:44 PM  Radiology No results found.  Procedures Procedures    Medications Ordered in ED Medications - No data to display  ED Course/ Medical Decision Making/ A&P                             Medical Decision Making Amount and/or Complexity of Data Reviewed Labs: ordered.  Risk Decision regarding hospitalization.    Medical Screen Complete  This patient presented to the ED with complaint of back pain.  This complaint involves an extensive number of treatment options. The initial differential diagnosis includes, but is not limited to, lumbar discitis  This presentation is: Acute,  Chronic, Self-Limited, Previously Undiagnosed, Uncertain Prognosis, Complicated, Systemic Symptoms, and Threat to Life/Bodily Function  Patient sent to be admitted for workup of possible L4-L5 discitis.  Patient known to Dr. Yevette Edwards.  Patient is not a surgical candidate.  Patient known to Dr. Daiva Eves with ID.  Patient to be admitted.  No antibiotics to be administered prior to aspiration attempt by IR per Dr. Daiva Eves.  Dr. Daiva Eves reports that ID will follow in consultation.  Patient understands need for admission and plan of care.  Hospitalist service made  aware of case and will evaluate for admission.  Additional history obtained:  External records from outside sources obtained and reviewed including prior ED visits and prior Inpatient records.    Lab Tests:  I ordered and personally interpreted labs.  The pertinent results include: CBC, CMP, INR, blood cultures x 2, lactic acid   Problem List / ED Course:  Concern for possible lumbar spine discitis   Reevaluation:  After the interventions noted above, I reevaluated the patient and found that they have: stayed the same Disposition:  After consideration of the diagnostic results and the patients response to treatment, I feel that the patent would benefit from admission.          Final Clinical Impression(s) / ED Diagnoses Final diagnoses:  Back pain, unspecified back location, unspecified back pain laterality, unspecified chronicity    Rx / DC Orders ED Discharge Orders     None         Valarie Merino, MD 10/30/22 1652

## 2022-10-30 NOTE — ED Triage Notes (Signed)
Pt BIB EMS from [facility]. Pt had an XR and it showed that he had infection in his bone, the XR was done 2 weeks prior. Pt answering questions appropriately, alert and oriented x4 with EMS.   EMS Vitals 60 HR  134/76 18 RR  156 CBG

## 2022-10-31 ENCOUNTER — Observation Stay (HOSPITAL_COMMUNITY): Payer: No Typology Code available for payment source

## 2022-10-31 ENCOUNTER — Encounter (HOSPITAL_COMMUNITY): Payer: Self-pay | Admitting: Internal Medicine

## 2022-10-31 DIAGNOSIS — M4620 Osteomyelitis of vertebra, site unspecified: Secondary | ICD-10-CM

## 2022-10-31 DIAGNOSIS — R768 Other specified abnormal immunological findings in serum: Secondary | ICD-10-CM | POA: Diagnosis not present

## 2022-10-31 DIAGNOSIS — R269 Unspecified abnormalities of gait and mobility: Secondary | ICD-10-CM | POA: Diagnosis present

## 2022-10-31 DIAGNOSIS — Z79899 Other long term (current) drug therapy: Secondary | ICD-10-CM | POA: Diagnosis not present

## 2022-10-31 DIAGNOSIS — Z981 Arthrodesis status: Secondary | ICD-10-CM | POA: Diagnosis not present

## 2022-10-31 DIAGNOSIS — I10 Essential (primary) hypertension: Secondary | ICD-10-CM | POA: Diagnosis present

## 2022-10-31 DIAGNOSIS — M4640 Discitis, unspecified, site unspecified: Secondary | ICD-10-CM | POA: Diagnosis not present

## 2022-10-31 DIAGNOSIS — T847XXD Infection and inflammatory reaction due to other internal orthopedic prosthetic devices, implants and grafts, subsequent encounter: Secondary | ICD-10-CM | POA: Diagnosis not present

## 2022-10-31 DIAGNOSIS — I2510 Atherosclerotic heart disease of native coronary artery without angina pectoris: Secondary | ICD-10-CM | POA: Diagnosis present

## 2022-10-31 DIAGNOSIS — R32 Unspecified urinary incontinence: Secondary | ICD-10-CM

## 2022-10-31 DIAGNOSIS — Z888 Allergy status to other drugs, medicaments and biological substances status: Secondary | ICD-10-CM | POA: Diagnosis not present

## 2022-10-31 DIAGNOSIS — Z993 Dependence on wheelchair: Secondary | ICD-10-CM | POA: Diagnosis not present

## 2022-10-31 DIAGNOSIS — G8929 Other chronic pain: Secondary | ICD-10-CM | POA: Diagnosis present

## 2022-10-31 DIAGNOSIS — J4489 Other specified chronic obstructive pulmonary disease: Secondary | ICD-10-CM | POA: Diagnosis present

## 2022-10-31 DIAGNOSIS — T847XXA Infection and inflammatory reaction due to other internal orthopedic prosthetic devices, implants and grafts, initial encounter: Secondary | ICD-10-CM

## 2022-10-31 DIAGNOSIS — M48061 Spinal stenosis, lumbar region without neurogenic claudication: Secondary | ICD-10-CM | POA: Diagnosis present

## 2022-10-31 DIAGNOSIS — M4626 Osteomyelitis of vertebra, lumbar region: Secondary | ICD-10-CM | POA: Diagnosis present

## 2022-10-31 DIAGNOSIS — M4646 Discitis, unspecified, lumbar region: Secondary | ICD-10-CM | POA: Diagnosis present

## 2022-10-31 DIAGNOSIS — F444 Conversion disorder with motor symptom or deficit: Secondary | ICD-10-CM | POA: Diagnosis present

## 2022-10-31 DIAGNOSIS — E785 Hyperlipidemia, unspecified: Secondary | ICD-10-CM | POA: Diagnosis present

## 2022-10-31 DIAGNOSIS — N400 Enlarged prostate without lower urinary tract symptoms: Secondary | ICD-10-CM | POA: Diagnosis present

## 2022-10-31 DIAGNOSIS — B958 Unspecified staphylococcus as the cause of diseases classified elsewhere: Secondary | ICD-10-CM | POA: Diagnosis present

## 2022-10-31 DIAGNOSIS — Z885 Allergy status to narcotic agent status: Secondary | ICD-10-CM | POA: Diagnosis not present

## 2022-10-31 DIAGNOSIS — J4520 Mild intermittent asthma, uncomplicated: Secondary | ICD-10-CM | POA: Diagnosis present

## 2022-10-31 LAB — URINALYSIS, ROUTINE W REFLEX MICROSCOPIC
Bilirubin Urine: NEGATIVE
Glucose, UA: NEGATIVE mg/dL
Hgb urine dipstick: NEGATIVE
Ketones, ur: NEGATIVE mg/dL
Nitrite: POSITIVE — AB
Protein, ur: NEGATIVE mg/dL
Specific Gravity, Urine: 1.017 (ref 1.005–1.030)
WBC, UA: >50 WBC/hpf (ref 0–5)
pH: 6 (ref 5.0–8.0)

## 2022-10-31 LAB — SEDIMENTATION RATE: Sed Rate: 55 mm/hr — ABNORMAL HIGH (ref 0–16)

## 2022-10-31 LAB — HEPATITIS A ANTIBODY, TOTAL: hep A Total Ab: REACTIVE — AB

## 2022-10-31 LAB — HEPATITIS C ANTIBODY: HCV Ab: REACTIVE — AB

## 2022-10-31 LAB — C-REACTIVE PROTEIN: CRP: 0.7 mg/dL (ref <1.0)

## 2022-10-31 MED ORDER — FENTANYL CITRATE (PF) 100 MCG/2ML IJ SOLN
INTRAMUSCULAR | Status: AC | PRN
  Administered 2022-10-31: 25 ug via INTRAVENOUS

## 2022-10-31 MED ORDER — MIDAZOLAM HCL 2 MG/2ML IJ SOLN
INTRAMUSCULAR | Status: AC | PRN
  Administered 2022-10-31: 1 mg via INTRAVENOUS

## 2022-10-31 MED ORDER — FENTANYL CITRATE (PF) 100 MCG/2ML IJ SOLN
INTRAMUSCULAR | Status: AC
  Filled 2022-10-31: qty 2

## 2022-10-31 MED ORDER — BUPIVACAINE HCL (PF) 0.5 % IJ SOLN
INTRAMUSCULAR | Status: AC
  Administered 2022-10-31: 12 mL via INTRADERMAL
  Filled 2022-10-31: qty 30

## 2022-10-31 MED ORDER — SODIUM CHLORIDE 0.9 % IV SOLN
8.0000 mg/kg | Freq: Every day | INTRAVENOUS | Status: DC
  Administered 2022-10-31 – 2022-11-02 (×3): 750 mg via INTRAVENOUS
  Filled 2022-10-31 (×4): qty 15

## 2022-10-31 MED ORDER — SODIUM CHLORIDE 0.9 % IV SOLN
2.0000 g | INTRAVENOUS | Status: DC
  Administered 2022-10-31 – 2022-11-01 (×2): 2 g via INTRAVENOUS
  Filled 2022-10-31 (×2): qty 20

## 2022-10-31 MED ORDER — MIDAZOLAM HCL 2 MG/2ML IJ SOLN
INTRAMUSCULAR | Status: AC
  Filled 2022-10-31: qty 2

## 2022-10-31 NOTE — Progress Notes (Signed)
Patient's events over past 24 hours noted. He was due to see me in the office yesterday but he cancelled his appointment. However, upon my review of his CT and MRI, it appeared as though infection was quite likely. I did wish to avoid a delay in treatment of a probable infection, and did reach out to ID, and the patient was ultimately recommended to go to the ED for a timely workup and treatment for a probable infection. ESR and CRP are both elevated. Back wound appears benign. L4/5 disc space was aspirated 20 minutes ago, and I suspect ID will recommend starting empiric abx soon, pending cultures, although that will be Dr. Lucianne Lei Dam's call. I appreciate very much the assistance of Drs. [employer] and Zhang.

## 2022-10-31 NOTE — ED Notes (Signed)
ED TO INPATIENT HANDOFF REPORT  ED Nurse Name and Phone #: Ivin Poot Name/Age/Gender Evan Morgan 72 y.o. male Room/Bed: H021C/H021C  Code Status   Code Status: Full Code  Home/SNF/Other Rehab Patient oriented to: self, place, time, and situation Is this baseline? Yes   Triage Complete: Triage complete  Chief Complaint Discitis of lumbar region [M46.46]  Triage Note Pt BIB EMS from [facility]. Pt had an XR and it showed that he had infection in his bone, the XR was done 2 weeks prior. Pt answering questions appropriately, alert and oriented x4 with EMS.   EMS Vitals 60 HR  134/76 18 RR  156 CBG   Allergies Allergies  Allergen Reactions   Amlodipine Other (See Comments)    Unknown reaction    Codeine Nausea And Vomiting   Lisinopril Cough    Level of Care/Admitting Diagnosis ED Disposition     ED Disposition  Admit   Condition  --   Comment  Hospital Area: [facility] [[id number]]  Level of Care: Med-Surg [16]  May place patient in observation at [facility] or [place] if equivalent level of care is available:: No  Covid Evaluation: Asymptomatic - no recent exposure (last 10 days) testing not required  Diagnosis: Discitis of lumbar region [671245]  Admitting Physician: Lequita Halt [[id number]]  Attending Physician: Lequita Halt [[id number]]          B Medical/Surgery History Past Medical History:  Diagnosis Date   Arthritis    knees   Asthma    As a child   History of hepatitis C    Hypertension    Past Surgical History:  Procedure Laterality Date   ALLOGRAFT APPLICATION Left 7/67/3419   Procedure: ALLOGRAFT APPLICATION;  Surgeon: Phylliss Bob, MD;  Location: [place];  Service: Orthopedics;  Laterality: Left;   ANTERIOR LAT LUMBAR FUSION  07/23/2019   Procedure: LUMBAR TWO-THREE LEFT LATERAL INTERBODY FUSION WITH INSTRUMENTATION AND ALLOGRAFT;  Surgeon: Phylliss Bob, MD;  Location: [place];  Service: Orthopedics;;    BACK SURGERY     x3   TRANSFORAMINAL LUMBAR INTERBODY FUSION (TLIF) WITH PEDICLE SCREW FIXATION 1 LEVEL Left 04/13/2022   Procedure: HARDWARE REMOVAL  LEFT-SIDED LUMBAR 4 - LUMBAR 5 TRANSFORAMINAL LUMBAR INTERBODY FUSION AND DECOMPRESSION WITH INSTRUMENTATION;  Surgeon: Phylliss Bob, MD;  Location: [place];  Service: Orthopedics;  Laterality: Left;     A IV Location/Drains/Wounds Patient Lines/Drains/Airways Status     Active Line/Drains/Airways     Name Placement date Placement time Site Days   Peripheral IV 10/30/22 20 G Right Antecubital 10/30/22  1538  Antecubital  1   External Urinary Catheter 10/30/22  1700  --  1            Intake/Output Last 24 hours No intake or output data in the 24 hours ending 10/31/22 1209  Labs/Imaging Results for orders placed or performed during the hospital encounter of 10/30/22 (from the past 48 hour(s))  CBC with Differential     Status: Abnormal   Collection Time: 10/30/22  3:35 PM  Result Value Ref Range   WBC 3.9 (L) 4.0 - 10.5 K/uL   RBC 4.97 4.22 - 5.81 MIL/uL   Hemoglobin 14.6 13.0 - 17.0 g/dL   HCT 43.8 39.0 - 52.0 %   MCV 88.1 80.0 - 100.0 fL   MCH 29.4 26.0 - 34.0 pg   MCHC 33.3 30.0 - 36.0 g/dL   RDW 14.0 11.5 - 15.5 %  Platelets 123 (L) 150 - 400 K/uL    Comment: REPEATED TO VERIFY   nRBC 0.0 0.0 - 0.2 %   Neutrophils Relative % 39 %   Neutro Abs 1.5 (L) 1.7 - 7.7 K/uL   Lymphocytes Relative 45 %   Lymphs Abs 1.8 0.7 - 4.0 K/uL   Monocytes Relative 12 %   Monocytes Absolute 0.5 0.1 - 1.0 K/uL   Eosinophils Relative 3 %   Eosinophils Absolute 0.1 0.0 - 0.5 K/uL   Basophils Relative 1 %   Basophils Absolute 0.0 0.0 - 0.1 K/uL   Immature Granulocytes 0 %   Abs Immature Granulocytes 0.01 0.00 - 0.07 K/uL    Comment: Performed at [facility] [address] [place], [place] [address]  Comprehensive metabolic panel     Status: Abnormal   Collection Time: 10/30/22  3:35 PM  Result Value Ref Range   Sodium 136 135  - 145 mmol/L   Potassium 3.6 3.5 - 5.1 mmol/L   Chloride 100 98 - 111 mmol/L   CO2 29 22 - 32 mmol/L   Glucose, Bld 112 (H) 70 - 99 mg/dL    Comment: Glucose reference range applies only to samples taken after fasting for at least 8 hours.   BUN 11 8 - 23 mg/dL   Creatinine, Ser 0.88 0.61 - 1.24 mg/dL   Calcium 9.4 8.9 - 10.3 mg/dL   Total Protein 7.8 6.5 - 8.1 g/dL   Albumin 3.6 3.5 - 5.0 g/dL   AST 21 15 - 41 U/L   ALT 15 0 - 44 U/L   Alkaline Phosphatase 34 (L) 38 - 126 U/L   Total Bilirubin 0.5 0.3 - 1.2 mg/dL   GFR, Estimated >60 >60 mL/min    Comment: (NOTE) Calculated using the CKD-EPI Creatinine Equation (2021)    Anion gap 7 5 - 15    Comment: Performed at [facility] [address] [place], [place] [address]  Lactic acid, plasma     Status: None   Collection Time: 10/30/22  3:35 PM  Result Value Ref Range   Lactic Acid, Venous 1.1 0.5 - 1.9 mmol/L    Comment: Performed at [facility] [address], [place], [place] [address]  Culture, blood (routine x 2)     Status: None (Preliminary result)   Collection Time: 10/30/22  3:35 PM   Specimen: BLOOD  Result Value Ref Range   Specimen Description BLOOD RIGHT ANTECUBITAL    Special Requests      BOTTLES DRAWN AEROBIC AND ANAEROBIC Blood Culture adequate volume   Culture      NO GROWTH < 24 HOURS Performed at [facility], [place] [address], [place], [place] [address]    Report Status PENDING   Protime-INR     Status: None   Collection Time: 10/30/22  3:35 PM  Result Value Ref Range   Prothrombin Time 14.1 11.4 - 15.2 seconds   INR 1.1 0.8 - 1.2    Comment: (NOTE) INR goal varies based on device and disease states. Performed at [facility], [place] [address] [place], [place] [address]   HIV Antibody (routine testing w rflx)     Status: None   Collection Time: 10/30/22  6:02 PM  Result Value Ref Range   HIV Screen 4th Generation wRfx Non Reactive Non Reactive    Comment: Performed at  [facility], [place] [address], [place], [place] [address]  Urinalysis, Routine w reflex microscopic -Urine, Clean Catch     Status: Abnormal   Collection Time: 10/30/22 11:46 PM  Result Value Ref Range   Color, Urine YELLOW YELLOW   APPearance HAZY (A) CLEAR   Specific Gravity, Urine 1.017 1.005 - 1.030   pH 6.0 5.0 - 8.0   Glucose, UA NEGATIVE NEGATIVE mg/dL   Hgb urine dipstick NEGATIVE NEGATIVE   Bilirubin Urine NEGATIVE NEGATIVE   Ketones, ur NEGATIVE NEGATIVE mg/dL   Protein, ur NEGATIVE NEGATIVE mg/dL   Nitrite POSITIVE (A) NEGATIVE   Leukocytes,Ua MODERATE (A) NEGATIVE   RBC / HPF 0-5 0 - 5 RBC/hpf   WBC, UA >50 0 - 5 WBC/hpf   Bacteria, UA MANY (A) NONE SEEN   Squamous Epithelial / HPF 0-5 0 - 5 /HPF   Mucus PRESENT     Comment: Performed at [facility], [address] [place], [place] [address]   No results found.  Pending Labs Unresulted Labs (From admission, onward)     Start     Ordered   11/01/22 0500  HCV RNA quant  Tomorrow morning,   R        10/31/22 1036   10/30/22 1526  Culture, blood (routine x 2)  BLOOD CULTURE X 2,   R (with STAT occurrences)      10/30/22 1525   10/30/22 1525  Lactic acid, plasma  Now then every 2 hours,   R (with STAT occurrences)      10/30/22 1525            Vitals/Pain Today's Vitals   10/31/22 0005 10/31/22 0645 10/31/22 0853 10/31/22 1208  BP: (!) 156/83 139/78 137/77 122/71  Pulse: (!) 58 (!) 58 (!) 57 63  Resp:  17 16 16   Temp: 98 F (36.7 C) 98 F (36.7 C) 98 F (36.7 C) 97.8 F (36.6 C)  TempSrc: Oral Oral Oral Oral  SpO2: 100% 100% 98% 100%  Weight:      Height:      PainSc:  7  4  3      Isolation Precautions No active isolations  Medications Medications  acetaminophen (TYLENOL) tablet 1,000 mg (has no administration in time range)  oxyCODONE-acetaminophen (PERCOCET/ROXICET) 5-325 MG per tablet 1-2 tablet (2 tablets Oral Given 10/31/22 0648)  atenolol (TENORMIN) tablet 50 mg (50 mg Oral  Given 10/31/22 0900)  hydrALAZINE (APRESOLINE) tablet 10 mg (10 mg Oral Given 10/31/22 0900)  hydrochlorothiazide (HYDRODIURIL) tablet 25 mg (25 mg Oral Given 10/31/22 0900)  bisacodyl (DULCOLAX) suppository 10 mg (has no administration in time range)  ondansetron (ZOFRAN) tablet 4 mg (has no administration in time range)  senna (SENOKOT) tablet 8.6 mg (8.6 mg Oral Given 10/31/22 0900)  finasteride (PROSCAR) tablet 5 mg (5 mg Oral Given 10/31/22 0902)  tamsulosin (FLOMAX) capsule 0.8 mg (0.8 mg Oral Given 10/31/22 0900)  gabapentin (NEURONTIN) capsule 900 mg (900 mg Oral Given 10/30/22 2111)  methocarbamol (ROBAXIN) tablet 500-1,000 mg (has no administration in time range)  enoxaparin (LOVENOX) injection 40 mg (40 mg Subcutaneous Given 10/30/22 1755)    Mobility walks with device     Focused Assessments musculoskeletal   R Recommendations: See Admitting Provider Note  Report given to:   Additional Notes:

## 2022-10-31 NOTE — Procedures (Signed)
INR.  Status post L4-L5 disc space fluoroscopic guided aspiration.  Approximately 2 cc of blood-tinged fluid aspirate obtained and sent for microbiologic analysis.  Patient tolerated  the procedure well.  No acute complications.  Arlean Hopping MD.

## 2022-10-31 NOTE — Assessment & Plan Note (Addendum)
-   s/p worsening low back pain recently - LE weakness/paresthesias baseline still - s/p L4/5 intervertebral disc space aspiration performed on 10/31/2022 -Culture growing rare Staph capitis, no anaerobes -Patient treated with Ancef and daptomycin during hospitalization.  He was continued on monotherapy Ancef per ID recommendations with course to complete on 12/11/2022. - PICC placed on 11/01/22 - weekly labs: CBC w/ diff, BMP, CRP, ESR

## 2022-10-31 NOTE — ED Notes (Signed)
Pt to IR

## 2022-10-31 NOTE — Consult Note (Addendum)
[facility] for Infectious Disease    Date of Admission:  10/30/2022     Total days of antibiotics 0              Reason for Consult: suspected lumbar infection w/ HW     Referring Provider: Jones Bales  Primary Care Provider: Charlsie Merles, MD   Assessment: Evan Morgan is a 72 y.o. male with worsening back pain over the last 4+ weeks. He has extensive history of back surgeries, most recently in August 2023 where he had L4-5 decompression and fusion for severe spinal stenosis, weakness and unrelenting back pain during sleep. He has not had a significant improvement in numbness or strength and remains w/c bound. He is from SNF ([facility]). Outpatient he has followed up with Dr. Katherina Right from what I understand for worsened back pain - MRI with edema in the L4 and L5 vertebral bodies without osseous fusion on the CT raises the possibility of pseudoarthrosis. Infection is not entirely excluded. Prior to this CT was done and showed new lucency around L5 pedicle screws indicating infection or loosening. He has not had any infectious symptoms throughout this time frame. Aside from the pain, the neurologic deficits he has are stable. Exam with well healed incision from last surgery. No erythema or induration. Elevated markers - ESR 54, CRP 6.2. If this is infected must have been hematogenous spread.   Would like to hold abx until we can arrange a biopsy of the area with IR for routine cultures. One set blood cultures drawn, will put order for second set for completeness, though without any infectious prodrome not sure they will yield anything.   Pain management per primary team.   Has a h/o Hep C reported in the chart - "treatment" reported in 2020 but nothing in our system to clarify this. Will run HCV RNA     Plan: Hold abx IR consult for biopsy at L4-5   Pain management per primary team  Ortho spine seeing outpatient - follow for recs  HCV RNA   Principal  Problem:   Discitis of lumbar region Active Problems:   Abnormal gait   Discitis    atenolol  50 mg Oral Daily   enoxaparin (LOVENOX) injection  40 mg Subcutaneous Q24H   finasteride  5 mg Oral Daily   gabapentin  900 mg Oral QHS   hydrALAZINE  10 mg Oral Daily   hydrochlorothiazide  25 mg Oral Daily   senna  1 tablet Oral BID   tamsulosin  0.8 mg Oral Daily    HPI: Evan Morgan is a 72 y.o. male in the ER for evaluation and management over suspected vertebral infection.   He has had worsening back pain over the last 4-6 weeks. MRI 2 weeks ago was done and showed possible concern of dicitis/OM at L4-5. He does not have any F/C ortho spine team (Dr Lynann Bologna) contacted Dr. Tommy Medal yesterday to discuss further and recommended ER to avoid delay in diagnosis/treatment.   He has had lower back pain and extremity numbness since his last surgery in August 2023. He has been wheelchair bound and uses depends to manage urine - he does not have any control over bladder and does not have great sensation.     Review of Systems: Review of Systems  Constitutional:  Negative for chills, fever and malaise/fatigue.  Gastrointestinal:  Negative for abdominal pain, diarrhea, nausea and vomiting.  Genitourinary:  Negative.        Hard to differentiate because he is generally numb  Musculoskeletal:  Positive for back pain. Negative for joint pain.  Skin:  Negative for rash.  Neurological:  Positive for weakness.    Past Medical History:  Diagnosis Date   Arthritis    knees   Asthma    As a child   History of hepatitis C    Hypertension     Social History   Tobacco Use   Smoking status: Never   Smokeless tobacco: Never  Vaping Use   Vaping Use: Never used  Substance Use Topics   Alcohol use: Not Currently    Comment: maybe a fifth per week, not all of the time   Drug use: Yes    Frequency: 4.0 times per week    Types: Marijuana    Comment: 3-4 times per week    Family History   Problem Relation Age of Onset   Colon cancer Neg Hx    Esophageal cancer Neg Hx    Rectal cancer Neg Hx    Stomach cancer Neg Hx    Allergies  Allergen Reactions   Amlodipine Other (See Comments)    Unknown reaction    Codeine Nausea And Vomiting   Lisinopril Cough    OBJECTIVE: Blood pressure 137/77, pulse (!) 57, temperature 98 F (36.7 C), temperature source Oral, resp. rate 16, height 5\' 11"  (1.803 m), weight 127 kg, SpO2 98 %.  Physical Exam Constitutional:      Appearance: Normal appearance. He is not ill-appearing.  Cardiovascular:     Rate and Rhythm: Normal rate and regular rhythm.  Pulmonary:     Effort: Pulmonary effort is normal.     Breath sounds: Normal breath sounds.  Abdominal:     General: Bowel sounds are normal. There is no distension.     Palpations: Abdomen is soft.  Skin:    General: Skin is warm and dry.  Neurological:     Mental Status: He is alert and oriented to person, place, and time.        Lab Results Lab Results  Component Value Date   WBC 3.9 (L) 10/30/2022   HGB 14.6 10/30/2022   HCT 43.8 10/30/2022   MCV 88.1 10/30/2022   PLT 123 (L) 10/30/2022    Lab Results  Component Value Date   CREATININE 0.88 10/30/2022   BUN 11 10/30/2022   NA 136 10/30/2022   K 3.6 10/30/2022   CL 100 10/30/2022   CO2 29 10/30/2022    Lab Results  Component Value Date   ALT 15 10/30/2022   AST 21 10/30/2022   ALKPHOS 34 (L) 10/30/2022   BILITOT 0.5 10/30/2022     Microbiology: Recent Results (from the past 240 hour(s))  Culture, blood (routine x 2)     Status: None (Preliminary result)   Collection Time: 10/30/22  3:35 PM   Specimen: BLOOD  Result Value Ref Range Status   Specimen Description BLOOD RIGHT ANTECUBITAL  Final   Special Requests   Final    BOTTLES DRAWN AEROBIC AND ANAEROBIC Blood Culture adequate volume   Culture   Final    NO GROWTH < 24 HOURS Performed at [facility], [address] [place], [place]  [address]    Report Status PENDING  Incomplete    Janene Madeira, MSN, NP-C [facility] Pager: [phone]  10/31/2022 10:06 AM

## 2022-10-31 NOTE — Progress Notes (Signed)
Pharmacy Antibiotic Note  Evan Morgan is a 72 y.o. male admitted on 10/30/2022 with medical history significant for L4-5 decompression and fusion for spinal stenosis now with MRI showing vertebral osteomyelitis involving L4, L5 and lucency around L5 pedicle screws . Pharmacy has been consulted for Daptomycin dosing.  Plan: Daptomycin 750mg  IV QD (ABW, 8mg /kg) Ceftriaxone 2g IV QD per MD Trend WBC, fever, renal function F/u cultures, clinical progress, levels as indicated De-escalate when able   Height: 5\' 11"  (180.3 cm) Weight: 127 kg (280 lb) IBW/kg (Calculated) : 75.3  Temp (24hrs), Avg:97.9 F (36.6 C), Min:97.7 F (36.5 C), Max:98 F (36.7 C)  Recent Labs  Lab 10/30/22 1535  WBC 3.9*  CREATININE 0.88  LATICACIDVEN 1.1    Estimated Creatinine Clearance: 106.1 mL/min (by C-G formula based on SCr of 0.88 mg/dL).    Allergies  Allergen Reactions   Amlodipine Other (See Comments)    Unknown reaction    Codeine Nausea And Vomiting   Lisinopril Cough    Microbiology results: 1/29 BCx: pending 1/30 Intervertebral disc cx: collected    Thank you for allowing pharmacy to be a part of this patient's care.  Ardyth Harps, PharmD Clinical Pharmacist

## 2022-10-31 NOTE — Progress Notes (Signed)
Progress Note    Evan Morgan   DZH:[id number]  DOB: 03-18-1951  DOA: 10/30/2022     0 PCP: Evan Merles, MD  Initial CC: back pain  Hospital Course: [name] is a 72 yo male with PMH severe lumbar spinal stenosis s/p multiple surgeries, mild intermittent asthma, HTN, OA who presented with worsening back pain.  Patient reported persistent lower back pain since last lumbar surgery in August 2023.  He has been wheelchair-bound since last year with chronic urinary incontinence and wears adult briefs. He had recent MRI L-spine performed on 10/12/2022.  This was notable for edema in the L4/5 vertebral bodies raising concern for possible infection. On admission, he underwent IR evaluation and tissue culture obtained from the L4/5 intervertebral space.  Interval History:  Seen this morning in the ER.  Still noting lower back pain.  Denies any change in his weakness or numbness in his lower extremities. Understands plan for IR biopsy today.  Assessment and Plan: * Discitis of lumbar region - s/p worsening low back pain recently - LE weakness/paresthesias baseline still - s/p L4/5 intervertebral disc space aspiration performed on 10/31/2022 -ID following, appreciate assistance - Follow-up tissue cultures -Continue pain treatment  Urinary incontinence - chronic  Abnormal gait - Now wheelchair-bound   Old records reviewed in assessment of this patient  Antimicrobials:   DVT prophylaxis:  enoxaparin (LOVENOX) injection 40 mg Start: 10/30/22 1800   Code Status:   Code Status: Full Code  Mobility Assessment (last 72 hours)     Mobility Assessment     Row Name 10/31/22 1400           Does patient have an order for bedrest or is patient medically unstable No - Continue assessment       What is the highest level of mobility based on the progressive mobility assessment? Level 2 (Chairfast) - Balance while sitting on edge of bed and cannot stand                 Barriers to discharge:  Disposition Plan:   Status is: Obs  Objective: Blood pressure (!) 148/95, pulse 60, temperature 97.8 F (36.6 C), temperature source Oral, resp. rate 12, height 5\' 11"  (1.803 m), weight 127 kg, SpO2 100 %.  Examination:  Physical Exam Constitutional:      General: He is not in acute distress.    Appearance: Normal appearance.  HENT:     Head: Normocephalic and atraumatic.     Mouth/Throat:     Mouth: Mucous membranes are moist.  Eyes:     Extraocular Movements: Extraocular movements intact.  Cardiovascular:     Rate and Rhythm: Normal rate and regular rhythm.     Heart sounds: Normal heart sounds.  Pulmonary:     Effort: Pulmonary effort is normal. No respiratory distress.     Breath sounds: Normal breath sounds. No wheezing.  Abdominal:     General: Bowel sounds are normal. There is no distension.     Palpations: Abdomen is soft.     Tenderness: There is no abdominal tenderness.  Musculoskeletal:     Cervical back: Normal range of motion and neck supple.  Skin:    General: Skin is warm and dry.  Neurological:     Mental Status: He is alert.     Comments: Baseline 1-2/5 LE strength; noted paresthesias in B/L LE  Psychiatric:        Mood and Affect: Mood normal.  Behavior: Behavior normal.      Consultants:  ID  Procedures:  10/31/22: IR biopsy of L4/5 intervertebral disc space  Data Reviewed: Results for orders placed or performed during the hospital encounter of 10/30/22 (from the past 24 hour(s))  CBC with Differential     Status: Abnormal   Collection Time: 10/30/22  3:35 PM  Result Value Ref Range   WBC 3.9 (L) 4.0 - 10.5 K/uL   RBC 4.97 4.22 - 5.81 MIL/uL   Hemoglobin 14.6 13.0 - 17.0 g/dL   HCT 43.8 39.0 - 52.0 %   MCV 88.1 80.0 - 100.0 fL   MCH 29.4 26.0 - 34.0 pg   MCHC 33.3 30.0 - 36.0 g/dL   RDW 14.0 11.5 - 15.5 %   Platelets 123 (L) 150 - 400 K/uL   nRBC 0.0 0.0 - 0.2 %   Neutrophils Relative % 39 %   Neutro  Abs 1.5 (L) 1.7 - 7.7 K/uL   Lymphocytes Relative 45 %   Lymphs Abs 1.8 0.7 - 4.0 K/uL   Monocytes Relative 12 %   Monocytes Absolute 0.5 0.1 - 1.0 K/uL   Eosinophils Relative 3 %   Eosinophils Absolute 0.1 0.0 - 0.5 K/uL   Basophils Relative 1 %   Basophils Absolute 0.0 0.0 - 0.1 K/uL   Immature Granulocytes 0 %   Abs Immature Granulocytes 0.01 0.00 - 0.07 K/uL  Comprehensive metabolic panel     Status: Abnormal   Collection Time: 10/30/22  3:35 PM  Result Value Ref Range   Sodium 136 135 - 145 mmol/L   Potassium 3.6 3.5 - 5.1 mmol/L   Chloride 100 98 - 111 mmol/L   CO2 29 22 - 32 mmol/L   Glucose, Bld 112 (H) 70 - 99 mg/dL   BUN 11 8 - 23 mg/dL   Creatinine, Ser 0.88 0.61 - 1.24 mg/dL   Calcium 9.4 8.9 - 10.3 mg/dL   Total Protein 7.8 6.5 - 8.1 g/dL   Albumin 3.6 3.5 - 5.0 g/dL   AST 21 15 - 41 U/L   ALT 15 0 - 44 U/L   Alkaline Phosphatase 34 (L) 38 - 126 U/L   Total Bilirubin 0.5 0.3 - 1.2 mg/dL   GFR, Estimated >60 >60 mL/min   Anion gap 7 5 - 15  Lactic acid, plasma     Status: None   Collection Time: 10/30/22  3:35 PM  Result Value Ref Range   Lactic Acid, Venous 1.1 0.5 - 1.9 mmol/L  Culture, blood (routine x 2)     Status: None (Preliminary result)   Collection Time: 10/30/22  3:35 PM   Specimen: BLOOD  Result Value Ref Range   Specimen Description BLOOD RIGHT ANTECUBITAL    Special Requests      BOTTLES DRAWN AEROBIC AND ANAEROBIC Blood Culture adequate volume   Culture      NO GROWTH < 24 HOURS Performed at [facility], [address], [place], [place] [address]    Report Status PENDING   Protime-INR     Status: None   Collection Time: 10/30/22  3:35 PM  Result Value Ref Range   Prothrombin Time 14.1 11.4 - 15.2 seconds   INR 1.1 0.8 - 1.2  HIV Antibody (routine testing w rflx)     Status: None   Collection Time: 10/30/22  6:02 PM  Result Value Ref Range   HIV Screen 4th Generation wRfx Non Reactive Non Reactive  Urinalysis, Routine w reflex  microscopic -Urine, Clean Catch     Status: Abnormal   Collection Time: 10/30/22 11:46 PM  Result Value Ref Range   Color, Urine YELLOW YELLOW   APPearance HAZY (A) CLEAR   Specific Gravity, Urine 1.017 1.005 - 1.030   pH 6.0 5.0 - 8.0   Glucose, UA NEGATIVE NEGATIVE mg/dL   Hgb urine dipstick NEGATIVE NEGATIVE   Bilirubin Urine NEGATIVE NEGATIVE   Ketones, ur NEGATIVE NEGATIVE mg/dL   Protein, ur NEGATIVE NEGATIVE mg/dL   Nitrite POSITIVE (A) NEGATIVE   Leukocytes,Ua MODERATE (A) NEGATIVE   RBC / HPF 0-5 0 - 5 RBC/hpf   WBC, UA >50 0 - 5 WBC/hpf   Bacteria, UA MANY (A) NONE SEEN   Squamous Epithelial / HPF 0-5 0 - 5 /HPF   Mucus PRESENT     I have reviewed pertinent nursing notes, vitals, labs, and images as necessary. I have ordered labwork to follow up on as indicated.  I have reviewed the last notes from staff over past 24 hours. I have discussed patient's care plan and test results with nursing staff, CM/SW, and other staff as appropriate.  Time spent: Greater than 50% of the 55 minute visit was spent in counseling/coordination of care for the patient as laid out in the A&P.   LOS: 0 days   Dwyane Dee, MD [facility] 10/31/2022, 2:31 PM

## 2022-10-31 NOTE — Hospital Course (Addendum)
Evan Morgan is a 72 yo male with PMH severe lumbar spinal stenosis s/p multiple surgeries, mild intermittent asthma, HTN, OA who presented with worsening back pain.  Patient reported persistent lower back pain since last lumbar surgery in August 2023.  He has been wheelchair-bound since last year with chronic urinary incontinence and wears adult briefs. He had recent MRI L-spine performed on 10/12/2022.  This was notable for edema in the L4/5 vertebral bodies raising concern for possible infection. On admission, he underwent IR evaluation and tissue culture obtained from the L4/5 intervertebral space. Cultures grew Staph capitis and he was de-escalated to Ancef to continue course at discharge per ID recommendations.

## 2022-10-31 NOTE — Assessment & Plan Note (Signed)
-  chronic

## 2022-10-31 NOTE — Assessment & Plan Note (Signed)
-  Now wheelchair-bound

## 2022-10-31 NOTE — Progress Notes (Signed)
PHARMACY CONSULT NOTE FOR:  OUTPATIENT  PARENTERAL ANTIBIOTIC THERAPY (OPAT)  Indication: osteomyelitis  Regimen: cefazolin 2g IV Q8H  End date: 12/11/22  IV antibiotic discharge orders are pended. To discharging provider:  please sign these orders via discharge navigator,  Select New Orders & click on the button choice - Manage This Unsigned Work.     Thank you for allowing pharmacy to be a part of this patient's care.  Adria Dill, PharmD PGY-2 Infectious Diseases Resident  11/03/2022 1:18 PM

## 2022-10-31 NOTE — Progress Notes (Signed)
PT Cancellation Note  Patient Details Name: Evan Morgan MRN: [id number] DOB: 1950/10/14   Cancelled Treatment:    Reason Eval/Treat Not Completed: Patient declined, no reason specified. PT will attempt to follow up tomorrow.   Zenaida Niece 10/31/2022, 5:17 PM

## 2022-10-31 NOTE — Consult Note (Signed)
Chief Complaint: Patient was seen in consultation today for back pain at the request of Dr. Jon Gills.  Supervising Physician: Luanne Bras  Patient Status: [facility] - ED  History of Present Illness: Evan Morgan is a 72 y.o. male with medical history significant for severe lumbar stenosis, status post multiple surgeries, asthma, HTN, OA.  He was sent to ED from ID for evaluation of worsening back pain.  Patient describes persistent worsening lower back pain since his last lumbar surgery 05/2022.  He is wheelchair bound since last year, experiences urinary incontinence, and states he has poor sensation in his lower body.  He has been taking oxycodone QID with minimal relief.  He has MRI a couple weeks ago.  ID concerned for osteomyelitis versus discitis at L4-5.    He denies headache, dizziness, fever, chills, N/V, SOB, CP or abdominal pain.  No new neurological developments.  He is not on antibiotics.  Past Medical History:  Diagnosis Date   Arthritis    knees   Asthma    As a child   History of hepatitis C    Hypertension     Past Surgical History:  Procedure Laterality Date   ALLOGRAFT APPLICATION Left 3/38/2505   Procedure: ALLOGRAFT APPLICATION;  Surgeon: Phylliss Bob, MD;  Location: [place];  Service: Orthopedics;  Laterality: Left;   ANTERIOR LAT LUMBAR FUSION  07/23/2019   Procedure: LUMBAR TWO-THREE LEFT LATERAL INTERBODY FUSION WITH INSTRUMENTATION AND ALLOGRAFT;  Surgeon: Phylliss Bob, MD;  Location: [place];  Service: Orthopedics;;   BACK SURGERY     x3   TRANSFORAMINAL LUMBAR INTERBODY FUSION (TLIF) WITH PEDICLE SCREW FIXATION 1 LEVEL Left 04/13/2022   Procedure: HARDWARE REMOVAL  LEFT-SIDED LUMBAR 4 - LUMBAR 5 TRANSFORAMINAL LUMBAR INTERBODY FUSION AND DECOMPRESSION WITH INSTRUMENTATION;  Surgeon: Phylliss Bob, MD;  Location: [place];  Service: Orthopedics;  Laterality: Left;    Allergies: Amlodipine, Codeine, and Lisinopril  Medications: Prior to  Admission medications   Medication Sig Start Date End Date Taking? Authorizing Provider  acetaminophen (TYLENOL) 500 MG tablet Take 1,000 mg by mouth every 6 (six) hours as needed for moderate pain.   Yes [provider]  atenolol (TENORMIN) 50 MG tablet Take 50 mg by mouth daily.    Yes [provider]  Cholecalciferol 50 MCG (2000 UT) TABS Take 2,000 Units by mouth daily.    Yes [provider]  DULCOLAX 10 MG suppository Place 10 mg rectally daily as needed for constipation. 05/10/22  Yes [provider]  finasteride (PROSCAR) 5 MG tablet Take 5 mg by mouth daily.   Yes [provider]  gabapentin (NEURONTIN) 300 MG capsule Take 900 mg by mouth at bedtime.   Yes [provider]  hydrALAZINE (APRESOLINE) 10 MG tablet Take 10 mg by mouth daily.   Yes [provider]  hydrochlorothiazide (HYDRODIURIL) 25 MG tablet Take 25 mg by mouth daily.    Yes [provider]  magnesium hydroxide (MILK OF MAGNESIA) 400 MG/5ML suspension Take 30 mLs by mouth daily as needed for mild constipation.   Yes [provider]  methocarbamol (ROBAXIN) 500 MG tablet Take 1-2 tablets (500-1,000 mg total) by mouth every 6 (six) hours as needed for muscle spasms. 05/24/22  Yes McKenzie, Lennie Muckle, PA-C  nystatin-triamcinolone ointment (MYCOLOG) Apply 1 Application topically daily as needed for dry skin. 05/10/22  Yes [provider]  ondansetron (ZOFRAN) 4 MG tablet Take 1 tablet (4 mg total) by mouth every 6 (six) hours  as needed for nausea. 05/19/22  Yes McKenzie, Lennie Muckle, PA-C  oxyCODONE-acetaminophen (PERCOCET/ROXICET) 5-325 MG tablet Take 1-2 tablets by mouth every 6 (six) hours as needed for moderate pain or severe pain. Patient taking differently: Take 1 tablet by mouth every 6 (six) hours as needed for moderate pain or severe pain. 05/24/22  Yes McKenzie, Kayla J, PA-C  SENNA LAXATIVE 8.6 MG tablet Take 1 tablet by mouth 2 (two) times  daily. Hold for loose stools. 05/10/22  Yes [provider]  tamsulosin (FLOMAX) 0.4 MG CAPS capsule Take 0.8 mg by mouth daily.   Yes [provider]     Family History  Problem Relation Age of Onset   Colon cancer Neg Hx    Esophageal cancer Neg Hx    Rectal cancer Neg Hx    Stomach cancer Neg Hx     Social History   Socioeconomic History   Marital status: Single    Spouse name: Not on file   Number of children: 2   Years of education: Not on file   Highest education level: Not on file  Occupational History   Not on file  Tobacco Use   Smoking status: Never   Smokeless tobacco: Never  Vaping Use   Vaping Use: Never used  Substance and Sexual Activity   Alcohol use: Not Currently    Comment: maybe a fifth per week, not all of the time   Drug use: Yes    Frequency: 4.0 times per week    Types: Marijuana    Comment: 3-4 times per week   Sexual activity: Not on file  Other Topics Concern   Not on file  Social History Narrative   Not on file   Social Determinants of Health   Financial Resource Strain: Not on file  Food Insecurity: Not on file  Transportation Needs: Not on file  Physical Activity: Not on file  Stress: Not on file  Social Connections: Not on file    Review of Systems: A 12 point ROS discussed and pertinent positives are indicated in the HPI above.  All other systems are negative.  Vital Signs: BP 137/77   Pulse (!) 57   Temp 98 F (36.7 C) (Oral)   Resp 16   Ht 5\' 11"  (1.803 m)   Wt 280 lb (127 kg)   SpO2 98%   BMI 39.05 kg/m   Physical Exam Constitutional:      General: He is not in acute distress.    Appearance: He is not toxic-appearing.  HENT:     Head: Normocephalic and atraumatic.     Mouth/Throat:     Mouth: Mucous membranes are dry.     Pharynx: Oropharynx is clear.  Eyes:     General: No scleral icterus.    Extraocular Movements: Extraocular movements intact.  Cardiovascular:     Rate and Rhythm:  Bradycardia present.     Pulses: Normal pulses.  Pulmonary:     Effort: Pulmonary effort is normal. No respiratory distress.     Breath sounds: Normal breath sounds.  Abdominal:     General: Abdomen is flat. There is no distension.     Palpations: Abdomen is soft.  Skin:    General: Skin is warm and dry.  Neurological:     General: No focal deficit present.     Mental Status: He is alert and oriented to person, place, and time.  Psychiatric:        Mood and Affect: Mood  normal.        Behavior: Behavior normal.     Imaging: MR LUMBAR SPINE WO CONTRAST  Result Date: 10/13/2022 CLINICAL DATA:  Unable to bear weight, evaluate for spinal stenosis. History of back surgery. EXAM: MRI LUMBAR SPINE WITHOUT CONTRAST TECHNIQUE: Multiplanar, multisequence MR imaging of the lumbar spine was performed. No intravenous contrast was administered. COMPARISON:  Same-day CT, CT lumbar spine 05/14/2022, MR lumbar spine 05/13/2022 FINDINGS: Segmentation: Standard; the lowest formed disc space is designated L5-S1. Alignment:  Normal. Vertebrae: Vertebral body heights are preserved. Background marrow signal is normal. Postsurgical changes reflecting posterior instrumented fusion from L3 through L5 with interbody spacers at L2-L3 through L4-L5, bilateral laminectomy at L3-L4, and left laminectomy at L4-L5 are noted. There are ghost tracks from prior L2 pedicle screws. There is no evidence of osseous fusion across the disc spaces on the same-day CT. Perihardware lucency around the L5 pedicle screws is better seen on the CT. There is edema within the vertebral bodies at L4 and L5 which is increased since the prior MRI. There is no convincing evidence of infection. There is no suspicious marrow signal abnormality. Conus medullaris and cauda equina: Conus extends to the L1 level. Conus and cauda equina appear normal. Paraspinal and other soft tissues: Bilateral renal cysts are noted requiring no specific imaging  follow-up. Postsurgical changes are noted in the soft tissues posteriorly in the lower lumbar spine. Previously seen fluid in the soft tissues has essentially resolved. Disc levels: T12-L1: No significant spinal canal or neural foraminal stenosis. L1-L2: No significant spinal canal or neural foraminal stenosis. L2-L3: Previous interbody fusion without significant spinal canal or neural foraminal stenosis. L3-L4: Status post posterior instrumented fusion and decompression. There is unchanged T2 hyperintense material in the dorsal aspect of the canal measuring up to 5 mm in thickness favored to reflect granulation tissue based on the prior contrast enhanced lumbar spine MRI. There is unchanged degenerative endplate spurring and right eccentric disc bulge. Findings result in unchanged mild spinal canal stenosis and moderate to severe right worse than left neural foraminal stenosis. L4-L5: Status post posterior instrumented fusion and left laminectomy. There is a persistent disc bulge, degenerative endplate spurring, and bulky facet arthropathy with ligamentum flavum thickening resulting in severe spinal canal stenosis with impingement of the cauda equina nerve roots, and moderate to severe right and moderate left neural foraminal stenosis. L5-S1: There is left worse than right endplate spurring and facet arthropathy resulting in unchanged moderate to severe left and mild-to-moderate right neural foraminal stenosis without significant spinal canal stenosis. IMPRESSION: 1. Postsurgical changes reflecting lumbar instrumented fusion and decompression as above. Lucency around the bilateral L5 pedicle screws is better seen on the same-day CT. 2. Edema in the L4 and L5 vertebral bodies without osseous fusion on the CT raises the possibility of pseudoarthrosis. Infection is not entirely excluded. 3. Persistent severe spinal canal stenosis with impingement of the cauda equina nerve roots at L4-L5, and moderate to severe right  and moderate left neural foraminal stenosis at this level. 4. Unchanged mild spinal canal stenosis and moderate to severe right worse than left neural foraminal stenosis at L3-L4, and moderate to severe left and mild-to-moderate right neural foraminal stenosis at L5-S1. Electronically Signed   By: Lesia Hausen M.D.   On: 10/13/2022 12:33   CT LUMBAR SPINE WO CONTRAST  Result Date: 10/12/2022 CLINICAL DATA:  Low back pain EXAM: CT LUMBAR SPINE WITHOUT CONTRAST TECHNIQUE: Multidetector CT imaging of the lumbar spine was performed without  intravenous contrast administration. Multiplanar CT image reconstructions were also generated. RADIATION DOSE REDUCTION: This exam was performed according to the departmental dose-optimization program which includes automated exposure control, adjustment of the mA and/or kV according to patient size and/or use of iterative reconstruction technique. COMPARISON:  05/14/2022 FINDINGS: Segmentation: The lowest lumbar type non-rib-bearing vertebra is labeled as L5. Alignment: 2 mm degenerative anterolisthesis at L3-4. Vertebrae: Posterolateral rod and pedicle screw fixation bilaterally at L3-L4-L5. There is prominent lucency around the L5 pedicle screws indicating loosening or infection, and new compared to 05/14/2022. There is also some minimal lucency around the right L3 pedicle screw which could reflect very early loosening or infection. Interbody spacer graft at L3 observed with surrounding lucency an without well-defined bony interbody bridging, but with clear bony bridging between the left facet joint at the L3-4 level. Posterior decompression at L3-4. Interbody spacer device with some adjacent graft but surrounding lucency along the endplates at H6-0. The lucency along the endplates and around the spacer is increased at this level. Interbody graft at L2-3 noted as well. Screw tracks from prior pedicle screws at L2 noted. No acute fracture identified. Paraspinal and other soft  tissues: Suspected trace right pleural fluid with adjacent atelectasis. Fluid density lesions in the kidneys are likely cysts but are not completely included on today's exam and are suboptimally characterized. None of the findings on today's exam indicates a need for follow up of these lesions. Atherosclerosis is present, including aortoiliac atherosclerotic disease. Disc levels: L1-2: No impingement.  Mild disc bulge. L2-3: No impingement.  Interbody graft material at this level. L3-4: There is at least moderate bilateral osseous foraminal stenosis due to intervertebral and facet spurring. Interbody graft material with surrounding lucency L4-5: Moderate right and mild left foraminal stenosis due to facet and intervertebral spurring. Partial facetectomy on the left. The interbody spacer and surrounding graph no high of surrounding lucency along the vertebral endplates, a change compared to 05/14/2022, and raising the possibility of pseudoarthrosis. There is some new fragmentation of the left superior articular facet at L5, as shown on images 55 through 58 of series 7, this fragmentation was not observed on the CT from 05/14/2022. L5-S1: No impingement identified.  Mild disc bulge. IMPRESSION: 1. The dominant change compared 05/14/2022 is at the L4-5 level, where there is new prominent lucency around the L5 pedicle screws (indicating loosening or infection); new lucency along the L4-5 endplates surrounding the interbody spacer and graft material raising the possibility of pseudoarthrosis; as well as new fragmentation of the remaining superior articular facet of L5 on the left. 2. At least moderate bilateral osseous foraminal stenosis at L3-4 and moderate right and mild left foraminal stenosis at L4-5 due to spurring. 3. Suspected trace right pleural fluid with adjacent atelectasis. 4. Aortic atherosclerosis. Aortic Atherosclerosis (ICD10-I70.0). Electronically Signed   By: Van Clines M.D.   On: 10/12/2022  15:28    Labs:  CBC: Recent Labs    04/10/22 1450 05/13/22 0123 05/13/22 0907 10/30/22 1535  WBC 4.7 5.2 4.4 3.9*  HGB 15.2 13.1 13.0 14.6  HCT 44.4 38.4* 37.1* 43.8  PLT 142* 125* 127* 123*    COAGS: Recent Labs    10/30/22 1535  INR 1.1    BMP: Recent Labs    04/10/22 1450 05/13/22 0123 10/30/22 1535  NA 136 136 136  K 3.8 3.1* 3.6  CL 101 104 100  CO2 25 25 29   GLUCOSE 97 101* 112*  BUN 11 14 11   CALCIUM 9.5  9.3 9.4  CREATININE 1.00 0.85 0.88  GFRNONAA >60 >60 >60    LIVER FUNCTION TESTS: Recent Labs    04/10/22 1450 10/30/22 1535  BILITOT 1.2 0.5  AST 23 21  ALT 17 15  ALKPHOS 37* 34*  PROT 8.1 7.8  ALBUMIN 3.9 3.6    Assessment and Plan:  Back Pain -ID reportedly concerned for infection -No fever, leukocytosis, or tachycardia -Imaging reviewed by Dr. Estanislado Pandy, who approves image-guided disc aspiration.  He notes that the procedure may be technically difficult/impossible to perform given the hardware in place, but won't know until looking with fluoro. -Pt in agreement to proceed, is appropriately NPO, not on blood thinners.  He requests phone call to his daughter to update on plan at this time.  Risks and benefits of L4-5 disc aspiration was discussed with the patient and/or patient's family including, but not limited to bleeding, infection, damage to adjacent structures or low yield requiring additional tests.  All of the questions were answered and there is agreement to proceed.  Consent signed and in IR.   Thank you for this interesting consult.  I greatly enjoyed meeting Evan Morgan and look forward to participating in their care.  A copy of this report was sent to the requesting provider on this date.  Electronically Signed: Pasty Spillers, PA 10/31/2022, 10:37 AM   I spent a total of 30 minutes in face to face in clinical consultation, greater than 50% of which was counseling/coordinating care for back pain

## 2022-11-01 ENCOUNTER — Inpatient Hospital Stay: Payer: Self-pay

## 2022-11-01 DIAGNOSIS — M4646 Discitis, unspecified, lumbar region: Secondary | ICD-10-CM | POA: Diagnosis not present

## 2022-11-01 LAB — CBC WITH DIFFERENTIAL/PLATELET
Abs Immature Granulocytes: 0.01 10*3/uL (ref 0.00–0.07)
Basophils Absolute: 0 10*3/uL (ref 0.0–0.1)
Basophils Relative: 1 %
Eosinophils Absolute: 0.1 10*3/uL (ref 0.0–0.5)
Eosinophils Relative: 2 %
HCT: 39.8 % (ref 39.0–52.0)
Hemoglobin: 13.3 g/dL (ref 13.0–17.0)
Immature Granulocytes: 0 %
Lymphocytes Relative: 44 %
Lymphs Abs: 1.7 10*3/uL (ref 0.7–4.0)
MCH: 29.1 pg (ref 26.0–34.0)
MCHC: 33.4 g/dL (ref 30.0–36.0)
MCV: 87.1 fL (ref 80.0–100.0)
Monocytes Absolute: 0.5 10*3/uL (ref 0.1–1.0)
Monocytes Relative: 14 %
Neutro Abs: 1.5 10*3/uL — ABNORMAL LOW (ref 1.7–7.7)
Neutrophils Relative %: 39 %
Platelets: 125 10*3/uL — ABNORMAL LOW (ref 150–400)
RBC: 4.57 MIL/uL (ref 4.22–5.81)
RDW: 13.9 % (ref 11.5–15.5)
WBC: 3.8 10*3/uL — ABNORMAL LOW (ref 4.0–10.5)
nRBC: 0 % (ref 0.0–0.2)

## 2022-11-01 LAB — CK: Total CK: 76 U/L (ref 49–397)

## 2022-11-01 LAB — BASIC METABOLIC PANEL
Anion gap: 9 (ref 5–15)
BUN: 12 mg/dL (ref 8–23)
CO2: 25 mmol/L (ref 22–32)
Calcium: 8.9 mg/dL (ref 8.9–10.3)
Chloride: 102 mmol/L (ref 98–111)
Creatinine, Ser: 0.96 mg/dL (ref 0.61–1.24)
GFR, Estimated: >60 mL/min (ref >60)
Glucose, Bld: 96 mg/dL (ref 70–99)
Potassium: 3.7 mmol/L (ref 3.5–5.1)
Sodium: 136 mmol/L (ref 135–145)

## 2022-11-01 LAB — MAGNESIUM: Magnesium: 1.7 mg/dL (ref 1.7–2.4)

## 2022-11-01 LAB — HEPATITIS B SURFACE ANTIGEN: Hepatitis B Surface Ag: NONREACTIVE

## 2022-11-01 MED ORDER — SODIUM CHLORIDE 0.9% FLUSH
10.0000 mL | Freq: Two times a day (BID) | INTRAVENOUS | Status: DC
  Administered 2022-11-01 – 2022-11-03 (×4): 10 mL

## 2022-11-01 MED ORDER — SODIUM CHLORIDE 0.9% FLUSH: 10.0000 mL | INTRAVENOUS | Status: DC | PRN

## 2022-11-01 MED ORDER — CHLORHEXIDINE GLUCONATE CLOTH 2 % EX PADS
6.0000 | MEDICATED_PAD | Freq: Every day | CUTANEOUS | Status: DC
  Administered 2022-11-01 – 2022-11-03 (×3): 6 via TOPICAL

## 2022-11-01 NOTE — Progress Notes (Signed)
Progress Note    Evan Morgan   YWV:[id number]  DOB: [birth year]  DOA: 10/30/2022     1 PCP: Charlsie Merles, MD  Initial CC: back pain  Hospital Course: Evan Morgan is a 72 yo male with PMH severe lumbar spinal stenosis s/p multiple surgeries, mild intermittent asthma, HTN, OA who presented with worsening back pain.  Patient reported persistent lower back pain since last lumbar surgery in August 2023.  He has been wheelchair-bound since last year with chronic urinary incontinence and wears adult briefs. He had recent MRI L-spine performed on 10/12/2022.  This was notable for edema in the L4/5 vertebral bodies raising concern for possible infection. On admission, he underwent IR evaluation and tissue culture obtained from the L4/5 intervertebral space.  Interval History:  No events overnight.  Energy slightly improved today and he also endorses some improvement in his back pain.  Lower extremity weakness and numbness remains at baseline.  He understands we are awaiting further culture results in efforts to narrow antibiotics as able.  Assessment and Plan: * Discitis of lumbar region - s/p worsening low back pain recently - LE weakness/paresthesias baseline still - s/p L4/5 intervertebral disc space aspiration performed on 10/31/2022 -ID following, appreciate assistance - Follow-up tissue cultures; currently no growth to date but re-incubated  - started on rocephin/dapto after discussion with ID and after culture was obtained - PICC ordered per ID in anticipation for prolonged abx course -Continue pain treatment  Urinary incontinence - chronic  Abnormal gait - Now wheelchair-bound   Old records reviewed in assessment of this patient  Antimicrobials:   DVT prophylaxis:  enoxaparin (LOVENOX) injection 40 mg Start: 10/30/22 1800   Code Status:   Code Status: Full Code  Mobility Assessment (last 72 hours)     Mobility Assessment     Row Name 11/01/22 1036 10/31/22 1400  10/31/22 1347       Does patient have an order for bedrest or is patient medically unstable No - Continue assessment No - Continue assessment No - Continue assessment     What is the highest level of mobility based on the progressive mobility assessment? Level 2 (Chairfast) - Balance while sitting on edge of bed and cannot stand Level 2 (Chairfast) - Balance while sitting on edge of bed and cannot stand Level 2 (Chairfast) - Balance while sitting on edge of bed and cannot stand     Is the above level different from baseline mobility prior to current illness? Yes - Recommend PT order -- Yes - Recommend PT order              Barriers to discharge:  Disposition Plan: SNF  Status is: Inpatient  Objective: Blood pressure 112/66, pulse (!) 54, temperature 98.5 F (36.9 C), resp. rate 15, height 5\' 11"  (1.803 m), weight 127 kg, SpO2 95 %.  Examination:  Physical Exam Constitutional:      General: He is not in acute distress.    Appearance: Normal appearance.  HENT:     Head: Normocephalic and atraumatic.     Mouth/Throat:     Mouth: Mucous membranes are moist.  Eyes:     Extraocular Movements: Extraocular movements intact.  Cardiovascular:     Rate and Rhythm: Normal rate and regular rhythm.     Heart sounds: Normal heart sounds.  Pulmonary:     Effort: Pulmonary effort is normal. No respiratory distress.     Breath sounds: Normal breath sounds. No wheezing.  Abdominal:  General: Bowel sounds are normal. There is no distension.     Palpations: Abdomen is soft.     Tenderness: There is no abdominal tenderness.  Musculoskeletal:     Cervical back: Normal range of motion and neck supple.  Skin:    General: Skin is warm and dry.  Neurological:     Mental Status: He is alert.     Comments: Baseline 1-2/5 LE strength; noted paresthesias in B/L LE  Psychiatric:        Mood and Affect: Mood normal.        Behavior: Behavior normal.      Consultants:  ID  Procedures:   10/31/22: IR biopsy of L4/5 intervertebral disc space  Data Reviewed: Results for orders placed or performed during the hospital encounter of 10/30/22 (from the past 24 hour(s))  Hepatitis B surface antigen     Status: None   Collection Time: 11/01/22  8:18 AM  Result Value Ref Range   Hepatitis B Surface Ag NON REACTIVE NON REACTIVE  CK     Status: None   Collection Time: 11/01/22  8:18 AM  Result Value Ref Range   Total CK 76 49 - 397 U/L  Basic metabolic panel     Status: None   Collection Time: 11/01/22  8:18 AM  Result Value Ref Range   Sodium 136 135 - 145 mmol/L   Potassium 3.7 3.5 - 5.1 mmol/L   Chloride 102 98 - 111 mmol/L   CO2 25 22 - 32 mmol/L   Glucose, Bld 96 70 - 99 mg/dL   BUN 12 8 - 23 mg/dL   Creatinine, Ser 0.96 0.61 - 1.24 mg/dL   Calcium 8.9 8.9 - 10.3 mg/dL   GFR, Estimated >60 >60 mL/min   Anion gap 9 5 - 15  CBC with Differential/Platelet     Status: Abnormal   Collection Time: 11/01/22  8:18 AM  Result Value Ref Range   WBC 3.8 (L) 4.0 - 10.5 K/uL   RBC 4.57 4.22 - 5.81 MIL/uL   Hemoglobin 13.3 13.0 - 17.0 g/dL   HCT 39.8 39.0 - 52.0 %   MCV 87.1 80.0 - 100.0 fL   MCH 29.1 26.0 - 34.0 pg   MCHC 33.4 30.0 - 36.0 g/dL   RDW 13.9 11.5 - 15.5 %   Platelets 125 (L) 150 - 400 K/uL   nRBC 0.0 0.0 - 0.2 %   Neutrophils Relative % 39 %   Neutro Abs 1.5 (L) 1.7 - 7.7 K/uL   Lymphocytes Relative 44 %   Lymphs Abs 1.7 0.7 - 4.0 K/uL   Monocytes Relative 14 %   Monocytes Absolute 0.5 0.1 - 1.0 K/uL   Eosinophils Relative 2 %   Eosinophils Absolute 0.1 0.0 - 0.5 K/uL   Basophils Relative 1 %   Basophils Absolute 0.0 0.0 - 0.1 K/uL   Immature Granulocytes 0 %   Abs Immature Granulocytes 0.01 0.00 - 0.07 K/uL  Magnesium     Status: None   Collection Time: 11/01/22  8:18 AM  Result Value Ref Range   Magnesium 1.7 1.7 - 2.4 mg/dL    I have reviewed pertinent nursing notes, vitals, labs, and images as necessary. I have ordered labwork to follow up on as  indicated.  I have reviewed the last notes from staff over past 24 hours. I have discussed patient's care plan and test results with nursing staff, CM/SW, and other staff as appropriate.  Time spent: Greater than 50%  of the 55 minute visit was spent in counseling/coordination of care for the patient as laid out in the A&P.   LOS: 1 day   Dwyane Dee, MD [facility] 11/01/2022, 3:13 PM

## 2022-11-01 NOTE — Progress Notes (Addendum)
Brief ID Note:   Cultures reincubating for better growth. Hopefully, we will have something to target tomorrow and make preliminary adjustments for  antibiotics. Given no systemic symptoms or concern for bacteremia will go ahead and place a PICC line orders now.  Continue daptomycin and ceftriaxone empirically for now.    Janene Madeira, FNP-

## 2022-11-01 NOTE — NC FL2 (Signed)
[place] LEVEL OF CARE FORM     IDENTIFICATION  Patient Name: Evan Morgan Birthdate: 1951/05/12 Sex: male Admission Date (Current Location): 10/30/2022  [facility] and [place] Number:  Herbalist and Address:  [facility], [place] [address], [place], [place] [address]      Provider Number: [id number]  Attending Physician Name and Address:  Dwyane Dee, MD  Relative Name and Phone Number:  Fredonia Highland [phone]    Current Level of Care: Hospital Recommended Level of Care: [place] Prior Approval Number:    Date Approved/Denied:   PASRR Number: [id number]  Discharge Plan: SNF    Current Diagnoses: Patient Active Problem List   Diagnosis Date Noted   Vertebral osteomyelitis ([facility]) 10/31/2022   Urinary incontinence 44/10/270   Hardware complicating wound infection ([place]) 10/31/2022   Discitis 10/30/2022   Discitis of lumbar region 10/30/2022   Post-op pain 05/13/2022   Intractable low back pain 05/13/2022   Radiculopathy 07/23/2019   Abnormal gait 09/03/2017   Bilateral leg numbness 09/03/2017   Muscle spasm 11/07/2016   BPH (benign prostatic hyperplasia) 08/30/2016   GERD (gastroesophageal reflux disease) 08/30/2016   HTN (hypertension) 08/30/2016   S/P lumbar fusion 08/27/2015   DDD (degenerative disc disease), lumbar 04/01/2015   Foraminal stenosis of lumbar region 04/01/2015   Spinal fusion failure 04/01/2015   Midline low back pain with sciatica 12/16/2014    Orientation RESPIRATION BLADDER Height & Weight     Self, Time, Situation, Place  Normal Continent, External catheter Weight: 280 lb (127 kg) Height:  5\' 11"  (180.3 cm)  BEHAVIORAL SYMPTOMS/MOOD NEUROLOGICAL BOWEL NUTRITION STATUS      Continent Diet (see d/c summary)  AMBULATORY STATUS COMMUNICATION OF NEEDS Skin   Extensive Assist Verbally Normal                       Personal Care Assistance Level of Assistance  Bathing,  Feeding, Dressing Bathing Assistance: Maximum assistance Feeding assistance: Limited assistance Dressing Assistance: Maximum assistance     Functional Limitations Info  Sight, Hearing, Speech Sight Info: Adequate Hearing Info: Adequate Speech Info: Adequate    SPECIAL CARE FACTORS FREQUENCY  PT (By licensed PT), OT (By licensed OT)     PT Frequency: 5x/wk OT Frequency: 5x/wk            Contractures Contractures Info: Not present    Additional Factors Info  Code Status Code Status Info: full             Current Medications (11/01/2022):  This is the current hospital active medication list Current Facility-Administered Medications  Medication Dose Route Frequency Provider Last Rate Last Admin   acetaminophen (TYLENOL) tablet 1,000 mg  1,000 mg Oral Q6H PRN Wynetta Fines T, MD       atenolol (TENORMIN) tablet 50 mg  50 mg Oral Daily Wynetta Fines T, MD   50 mg at 10/31/22 0900   bisacodyl (DULCOLAX) suppository 10 mg  10 mg Rectal Daily PRN Wynetta Fines T, MD       cefTRIAXone (ROCEPHIN) 2 g in sodium chloride 0.9 % 100 mL IVPB  2 g Intravenous Q24H Dwyane Dee, MD 200 mL/hr at 10/31/22 1742 Infusion Verify at 10/31/22 1742   DAPTOmycin (CUBICIN) 750 mg in sodium chloride 0.9 % IVPB  8 mg/kg (Adjusted) Intravenous Q2000 Dwyane Dee, MD   Stopped at 10/31/22 2110   enoxaparin (LOVENOX) injection 40 mg  40 mg Subcutaneous  Q24H Wynetta Fines T, MD   40 mg at 10/31/22 1932   finasteride (PROSCAR) tablet 5 mg  5 mg Oral Daily Wynetta Fines T, MD   5 mg at 11/01/22 0959   gabapentin (NEURONTIN) capsule 900 mg  900 mg Oral QHS Wynetta Fines T, MD   900 mg at 10/31/22 2034   hydrALAZINE (APRESOLINE) tablet 10 mg  10 mg Oral Daily Wynetta Fines T, MD   10 mg at 11/01/22 0959   hydrochlorothiazide (HYDRODIURIL) tablet 25 mg  25 mg Oral Daily Wynetta Fines T, MD   25 mg at 11/01/22 0959   methocarbamol (ROBAXIN) tablet 500-1,000 mg  500-1,000 mg Oral Q6H PRN Wynetta Fines T, MD   500 mg at  10/31/22 2034   ondansetron (ZOFRAN) tablet 4 mg  4 mg Oral Q6H PRN Lequita Halt, MD       oxyCODONE-acetaminophen (PERCOCET/ROXICET) 5-325 MG per tablet 1-2 tablet  1-2 tablet Oral Q6H PRN Lequita Halt, MD   2 tablet at 11/01/22 5537   senna (SENOKOT) tablet 8.6 mg  1 tablet Oral BID Wynetta Fines T, MD   8.6 mg at 11/01/22 1000   tamsulosin (FLOMAX) capsule 0.8 mg  0.8 mg Oral Daily Wynetta Fines T, MD   0.8 mg at 11/01/22 1000     Discharge Medications: Please see discharge summary for a list of discharge medications.  Relevant Imaging Results:  Relevant Lab Results:   Additional Information SS#: [id number]  Jinger Neighbors, LCSW

## 2022-11-01 NOTE — Evaluation (Signed)
Physical Therapy Evaluation Patient Details Name: Evan Morgan MRN: [id number] DOB: [birth year] Today's Date: 11/01/2022  History of Present Illness  Evan Morgan is a 72 yo male with PMH severe lumbar spinal stenosis s/p multiple surgeries (most recent 04/13/22 decomp/fusion), mild intermittent asthma, HTN, OA who presented with worsening back pain.  S/p L4-L5 disc space fluoroscopic guided aspiration in IR on 10/31/22 with workup for discitis.  Clinical Impression  Patient presents with decreased mobility due to LE weakness and fatigue with nausea this am.  Reports back pain improved, but still present.  Able to come to sitting on EOB with max A and min A for limited lateral scooting along EOB.  Reports using wheelchair at [facility] and slide board for transfers.  Hoping to do more standing.  PT will continue to follow during acute stay.  Recommend return to SNF at d/c.     Recommendations for follow up therapy are one component of a multi-disciplinary discharge planning process, led by the attending physician.  Recommendations may be updated based on patient status, additional functional criteria and insurance authorization.  Follow Up Recommendations Skilled nursing-short term rehab (<3 hours/day) Can patient physically be transported by private vehicle: No    Assistance Recommended at Discharge Intermittent Supervision/Assistance  Patient can return home with the following  A lot of help with bathing/dressing/bathroom;A lot of help with walking and/or transfers    Equipment Recommendations None recommended by PT  Recommendations for Other Services       Functional Status Assessment Patient has had a recent decline in their functional status and demonstrates the ability to make significant improvements in function in a reasonable and predictable amount of time.     Precautions / Restrictions Precautions Precautions: Fall;Back Precaution Comments: for comfort      Mobility  Bed  Mobility Overal bed mobility: Needs Assistance Bed Mobility: Rolling, Sidelying to Sit, Supine to Sit Rolling: Min assist Sidelying to sit: Max assist Supine to sit: Min assist     General bed mobility comments: lifting help for trunk and assist for legs off bed to sit, assist for positioning and one leg into bed to supine    Transfers Overall transfer level: Needs assistance   Transfers: Bed to chair/wheelchair/BSC            Lateral/Scoot Transfers: Min assist General transfer comment: along EOB moving toward [facility] with effort and time and only moving a little hampered by bed linen and soft bed; declined sit to stand or OOB due to not feeling well with nausea all morning    Ambulation/Gait                  Stairs            Wheelchair Mobility    Modified Rankin (Stroke Patients Only)       Balance Overall balance assessment: Needs assistance Sitting-balance support: Feet unsupported, No upper extremity supported Sitting balance-Leahy Scale: Good Sitting balance - Comments: can move dynamically with scooting, but reaching to his feet, feels unsafe like could "fall on his head"       Standing balance comment: NT                             Pertinent Vitals/Pain Pain Assessment Pain Assessment: Faces Faces Pain Scale: Hurts a little bit Pain Location: back Pain Descriptors / Indicators: Discomfort Pain Intervention(s): Monitored during session, Repositioned, Premedicated before session, Limited activity within patient's  tolerance    Home Living Family/patient expects to be discharged to:: Skilled nursing facility                   Additional Comments: at SNF since August 2023    Prior Function Prior Level of Function : Needs assist             Mobility Comments: used slide board for transfers, wants to work more on standing, had done some walking in parallel bars       Hand Dominance   Dominant Hand: Right     Extremity/Trunk Assessment   Upper Extremity Assessment Upper Extremity Assessment: LUE deficits/detail;RUE deficits/detail RUE Deficits / Details: AROM shoulder flexion about 100 with pain, reports due for injections to help shoulders; otherwise WFL, strength 4+/5 LUE Deficits / Details: AROM shoulder flexion about 100 with pain, reports due for injections to help shoulders; otherwise WFL, strength 4+/5    Lower Extremity Assessment Lower Extremity Assessment: LLE deficits/detail;RLE deficits/detail RLE Deficits / Details: AAROM WFL, mild spasms at times into extension; hip flexion strength 2/5, knee extension 3+/5, ankle DF 2+/5 RLE Sensation: decreased light touch (decreased to just below nipple line) RLE Coordination: decreased gross motor LLE Deficits / Details: AAROM WFL, mild spasms at times into extension; hip flexion strength 2/5, knee extension 3+/5, ankle DF 2+/5 LLE Sensation: decreased light touch (decreased to just below nipple line) LLE Coordination: decreased gross motor    Cervical / Trunk Assessment Cervical / Trunk Assessment: Back Surgery  Communication   Communication: No difficulties  Cognition Arousal/Alertness: Awake/alert Behavior During Therapy: WFL for tasks assessed/performed Overall Cognitive Status: Within Functional Limits for tasks assessed                                          General Comments General comments (skin integrity, edema, etc.): assisted to clean pt due to incontinent of BM in bed, wearing condom catheter    Exercises     Assessment/Plan    PT Assessment Patient needs continued PT services  PT Problem List Decreased activity tolerance;Impaired sensation;Decreased mobility;Decreased knowledge of use of DME;Decreased balance;Decreased strength       PT Treatment Interventions DME instruction;Functional mobility training;Balance training;Patient/family education;Therapeutic activities;Therapeutic  exercise;Neuromuscular re-education;Wheelchair mobility training    PT Goals (Current goals can be found in the Care Plan section)  Acute Rehab PT Goals Patient Stated Goal: to walk PT Goal Formulation: With patient Time For Goal Achievement: 11/15/22 Potential to Achieve Goals: Fair    Frequency Min 2X/week     Co-evaluation               AM-PAC PT "6 Clicks" Mobility  Outcome Measure Help needed turning from your back to your side while in a flat bed without using bedrails?: A Little Help needed moving from lying on your back to sitting on the side of a flat bed without using bedrails?: A Lot Help needed moving to and from a bed to a chair (including a wheelchair)?: A Lot Help needed standing up from a chair using your arms (e.g., wheelchair or bedside chair)?: Total Help needed to walk in hospital room?: Total Help needed climbing 3-5 steps with a railing? : Total 6 Click Score: 10    End of Session   Activity Tolerance: Patient limited by fatigue Patient left: in bed;with bed alarm set;with call bell/phone within reach  PT Visit Diagnosis: Other abnormalities of gait and mobility (R26.89);Other symptoms and signs involving the nervous system (R29.898);Other (comment) (paraparesis)    Time: 1433-1500 PT Time Calculation (min) (ACUTE ONLY): 27 min   Charges:   PT Evaluation $PT Eval Moderate Complexity: 1 Mod PT Treatments $Therapeutic Activity: 8-22 mins        Magda Kiel, PT Acute Rehabilitation Services Office:[phone] 11/01/2022   Reginia Naas 11/01/2022, 4:51 PM

## 2022-11-01 NOTE — Progress Notes (Signed)
    Patient evaluated at his bedside today As per his baseline, he does continue to report weakness in his bilateral legs.  He has not had any physical therapy.  As previously noted, his aspiration was performed.  It appears that there were no white blood cells or organisms seen.  He ESR is elevated, and his imaging studies appear very consistent with infection, including osteomyelitis into the L4 and L5 vertebral bodies, although this has not been diagnosed with certainty thus far.   Physical Exam: Vitals:   11/01/22 0457 11/01/22 0925  BP: 115/66 112/66  Pulse: 63 (!) 54  Resp: 18 15  Temp: 97.9 F (36.6 C) 98.5 F (36.9 C)  SpO2: 97% 95%    Patient's incisions continue to be well-healed.  He does demonstrate active dorsiflexion and plantarflexion, although does continue to be weak, with an examination consistent with his baseline.  Pt with ongoing lower extremity weakness and numbness, with imaging studies highly consistent with an infection.  -Although Mr. Goyer has not yet been definitively diagnosed with an infection, I do continue to feel this is likely.  I would be very interested in additional input from Dr. Tommy Medal in this regard.  Certainly, if an infection is present, one would expect improvement in the appearance of his CAT scan and MRI after empiric treatment.  For now, he will continue to get worked up and potentially treated with the assistance of medicine and infectious disease.  I will continue to follow his course alongside both teams.  I do again very much appreciate the assistance of Dr. Tommy Medal and Dr. Sabino Gasser.

## 2022-11-01 NOTE — Progress Notes (Signed)
Peripherally Inserted Central Catheter Placement  The IV Nurse has discussed with the patient and/or persons authorized to consent for the patient, the purpose of this procedure and the potential benefits and risks involved with this procedure.  The benefits include less needle sticks, lab draws from the catheter, and the patient may be discharged home with the catheter. Risks include, but not limited to, infection, bleeding, blood clot (thrombus formation), and puncture of an artery; nerve damage and irregular heartbeat and possibility to perform a PICC exchange if needed/ordered by physician.  Alternatives to this procedure were also discussed.  Bard Power PICC patient education guide, fact sheet on infection prevention and patient information card has been provided to patient /or left at bedside.    PICC Placement Documentation  PICC Single Lumen 11/01/22 Right Brachial 43 cm 1 cm (Active)  Indication for Insertion or Continuance of Line Prolonged intravenous therapies 11/01/22 1840  Exposed Catheter (cm) 1 cm 11/01/22 1840  Site Assessment Clean, Dry, Intact 11/01/22 1840  Line Status Flushed;Blood return noted;Saline locked 11/01/22 1840  Dressing Type Transparent 11/01/22 1840  Dressing Status Antimicrobial disc in place 11/01/22 [place] Not Applicable 40/98/11 9147  Line Care Connections checked and tightened 11/01/22 1840  Line Adjustment (NICU/IV Team Only) No 11/01/22 1840  Dressing Intervention New dressing 11/01/22 1840  Dressing Change Due 11/08/22 11/01/22 1840       Scotty Court 11/01/2022, 7:12 PM

## 2022-11-01 NOTE — TOC Initial Note (Addendum)
Transition of Care [facility]) - Initial/Assessment Note    Patient Details  Name: Evan Morgan MRN: [id number] Date of Birth: May 08, 1951  [facility] CM/SW Contact:    Jinger Neighbors, LCSW Phone Number: 11/01/2022, 1:56 PM  Clinical Narrative:                  CSW met with pt at bedside to complete assessment; pt aaox4 and engaged well with CSW. Pt reports he is STR at [facility], but has been there since September and not happy with the services he's been receiving. CSW contacted pt's dtr, Fredonia Highland, [phone] who confirmed pt's reports of being on STR floor since Sept. She stated she works at the [place] and would like paperwork to be faxed to the [place], and she maybe able to make some phone calls since she works there.  CSW will continue to work on work up and fax out.  CSW completed referral and printed all requested docs; Dr. Dwyane Dee signed referral.  CSW followed up with dtr and left a voicemail informing her referral was faxed out to the [place] and she can move forward with making the calls to the contacts she has within the [place].    Expected Discharge Plan: Skilled Nursing Facility Barriers to Discharge: Continued Medical Work up   Patient Goals and CMS Choice Patient states their goals for this hospitalization and ongoing recovery are:: pt states he would like to get his back infection cleared CMS Medicare.gov Compare Post Acute Care list provided to:: Patient Choice offered to / list presented to : Patient      Expected Discharge Plan and Services       Living arrangements for the past 2 months: [place]                                      Prior Living Arrangements/Services Living arrangements for the past 2 months: [place] Lives with:: Facility Resident Patient language and need for interpreter reviewed:: Yes Do you feel safe going back to the place where you live?: Yes      Need for Family Participation in Patient Care: Yes  (Comment) Care giver support system in place?: Yes (comment)   Criminal Activity/Legal Involvement Pertinent to Current Situation/Hospitalization: No - Comment as needed  Activities of Daily Living Home Assistive Devices/Equipment: Wheelchair, Eyeglasses, Built-in shower seat, Transfer board ADL Screening (condition at time of admission) Patient's cognitive ability adequate to safely complete daily activities?: Yes Is the patient deaf or have difficulty hearing?: Yes Does the patient have difficulty seeing, even when wearing glasses/contacts?: Yes Does the patient have difficulty concentrating, remembering, or making decisions?: No Patient able to express need for assistance with ADLs?: Yes Does the patient have difficulty dressing or bathing?: Yes Independently performs ADLs?: No Communication: Independent Dressing (OT): Needs assistance Is this a change from baseline?: Pre-admission baseline Grooming: Needs assistance Is this a change from baseline?: Pre-admission baseline Feeding: Independent Bathing: Needs assistance Is this a change from baseline?: Pre-admission baseline Toileting: Needs assistance Is this a change from baseline?: Pre-admission baseline In/Out Bed: Needs assistance Is this a change from baseline?: Pre-admission baseline Walks in Home: Dependent Is this a change from baseline?: Pre-admission baseline Does the patient have difficulty walking or climbing stairs?: Yes Weakness of Legs: Both Weakness of Arms/Hands: Both  Permission Sought/Granted  Emotional Assessment Appearance:: Appears stated age Attitude/Demeanor/Rapport: Engaged, Ambitious Affect (typically observed): Accepting, Appropriate, Calm Orientation: : Oriented to Self, Oriented to Place, Oriented to  Time, Oriented to Situation Alcohol / Substance Use: Not Applicable Psych Involvement: No (comment)  Admission diagnosis:  Discitis of lumbar region [M46.46] Back pain,  unspecified back location, unspecified back pain laterality, unspecified chronicity [M54.9] Discitis [M46.40] Patient Active Problem List   Diagnosis Date Noted   Vertebral osteomyelitis ([place]) 10/31/2022   Urinary incontinence 52/84/1324   Hardware complicating wound infection ([place]) 10/31/2022   Discitis 10/30/2022   Discitis of lumbar region 10/30/2022   Post-op pain 05/13/2022   Intractable low back pain 05/13/2022   Radiculopathy 07/23/2019   Abnormal gait 09/03/2017   Bilateral leg numbness 09/03/2017   Muscle spasm 11/07/2016   BPH (benign prostatic hyperplasia) 08/30/2016   GERD (gastroesophageal reflux disease) 08/30/2016   HTN (hypertension) 08/30/2016   S/P lumbar fusion 08/27/2015   DDD (degenerative disc disease), lumbar 04/01/2015   Foraminal stenosis of lumbar region 04/01/2015   Spinal fusion failure 04/01/2015   Midline low back pain with sciatica 12/16/2014   PCP:  Charlsie Merles, MD Pharmacy:   [facility] [address] [place] [address] Phone: [phone] Fax: [place], [address] [place] [address] Phone: [phone] Fax: [phone]     Social Determinants of Health (SDOH) Social History: SDOH Screenings   Food Insecurity: No Food Insecurity (10/31/2022)  Housing: Low Risk  (10/31/2022)  Transportation Needs: No Transportation Needs (10/31/2022)  Utilities: Not At Risk (10/31/2022)  Tobacco Use: Low Risk  (10/31/2022)   SDOH Interventions: Housing Interventions: Intervention Not Indicated   Readmission Risk Interventions     No data to display

## 2022-11-01 NOTE — Progress Notes (Signed)
CSW spoke with patient's daughter who is requesting advanced directives be completed with patient. CSW asked MD to consult spiritual care for completion of documents.  Madilyn Fireman, MSW, LCSW Transitions of Care  Clinical Social Worker II [phone]

## 2022-11-02 ENCOUNTER — Ambulatory Visit: Payer: Medicare PPO | Admitting: Neurology

## 2022-11-02 DIAGNOSIS — R768 Other specified abnormal immunological findings in serum: Secondary | ICD-10-CM | POA: Diagnosis not present

## 2022-11-02 DIAGNOSIS — T847XXD Infection and inflammatory reaction due to other internal orthopedic prosthetic devices, implants and grafts, subsequent encounter: Secondary | ICD-10-CM

## 2022-11-02 DIAGNOSIS — M4646 Discitis, unspecified, lumbar region: Secondary | ICD-10-CM | POA: Diagnosis not present

## 2022-11-02 DIAGNOSIS — R7689 Other specified abnormal immunological findings in serum: Secondary | ICD-10-CM

## 2022-11-02 LAB — COMPREHENSIVE METABOLIC PANEL
ALT: 13 U/L (ref 0–44)
AST: 22 U/L (ref 15–41)
Albumin: 3.1 g/dL — ABNORMAL LOW (ref 3.5–5.0)
Alkaline Phosphatase: 31 U/L — ABNORMAL LOW (ref 38–126)
Anion gap: 10 (ref 5–15)
BUN: 15 mg/dL (ref 8–23)
CO2: 27 mmol/L (ref 22–32)
Calcium: 9 mg/dL (ref 8.9–10.3)
Chloride: 99 mmol/L (ref 98–111)
Creatinine, Ser: 0.98 mg/dL (ref 0.61–1.24)
GFR, Estimated: >60 mL/min (ref >60)
Glucose, Bld: 114 mg/dL — ABNORMAL HIGH (ref 70–99)
Potassium: 3.3 mmol/L — ABNORMAL LOW (ref 3.5–5.1)
Sodium: 136 mmol/L (ref 135–145)
Total Bilirubin: 0.5 mg/dL (ref 0.3–1.2)
Total Protein: 6.9 g/dL (ref 6.5–8.1)

## 2022-11-02 LAB — CBC WITH DIFFERENTIAL/PLATELET
Abs Immature Granulocytes: 0.01 10*3/uL (ref 0.00–0.07)
Basophils Absolute: 0 10*3/uL (ref 0.0–0.1)
Basophils Relative: 1 %
Eosinophils Absolute: 0.1 10*3/uL (ref 0.0–0.5)
Eosinophils Relative: 2 %
HCT: 39.2 % (ref 39.0–52.0)
Hemoglobin: 13.3 g/dL (ref 13.0–17.0)
Immature Granulocytes: 0 %
Lymphocytes Relative: 44 %
Lymphs Abs: 1.7 10*3/uL (ref 0.7–4.0)
MCH: 29.6 pg (ref 26.0–34.0)
MCHC: 33.9 g/dL (ref 30.0–36.0)
MCV: 87.3 fL (ref 80.0–100.0)
Monocytes Absolute: 0.5 10*3/uL (ref 0.1–1.0)
Monocytes Relative: 12 %
Neutro Abs: 1.6 10*3/uL — ABNORMAL LOW (ref 1.7–7.7)
Neutrophils Relative %: 41 %
Platelets: 120 10*3/uL — ABNORMAL LOW (ref 150–400)
RBC: 4.49 MIL/uL (ref 4.22–5.81)
RDW: 14 % (ref 11.5–15.5)
WBC: 3.9 10*3/uL — ABNORMAL LOW (ref 4.0–10.5)
nRBC: 0 % (ref 0.0–0.2)

## 2022-11-02 LAB — HCV RNA QUANT: HCV Quantitative: NOT DETECTED IU/mL (ref >50)

## 2022-11-02 LAB — MAGNESIUM: Magnesium: 1.6 mg/dL — ABNORMAL LOW (ref 1.7–2.4)

## 2022-11-02 LAB — HEPATITIS B SURFACE ANTIBODY, QUANTITATIVE: Hep B S AB Quant (Post): 4.2 m[IU]/mL — ABNORMAL LOW (ref >9.9)

## 2022-11-02 MED ORDER — MAGNESIUM SULFATE 2 GM/50ML IV SOLN
2.0000 g | Freq: Once | INTRAVENOUS | Status: AC
  Administered 2022-11-02: 2 g via INTRAVENOUS
  Filled 2022-11-02: qty 50

## 2022-11-02 MED ORDER — CEFAZOLIN SODIUM-DEXTROSE 2-4 GM/100ML-% IV SOLN
2.0000 g | Freq: Three times a day (TID) | INTRAVENOUS | Status: DC
  Administered 2022-11-02 – 2022-11-03 (×4): 2 g via INTRAVENOUS
  Filled 2022-11-02 (×4): qty 100

## 2022-11-02 MED ORDER — POTASSIUM CHLORIDE CRYS ER 20 MEQ PO TBCR
40.0000 meq | EXTENDED_RELEASE_TABLET | Freq: Once | ORAL | Status: AC
  Administered 2022-11-02: 40 meq via ORAL
  Filled 2022-11-02: qty 2

## 2022-11-02 NOTE — Progress Notes (Signed)
Progress Note    Evan Morgan   GGY:[id number]  DOB: 03/24/1951  DOA: 10/30/2022     2 PCP: Charlsie Merles, MD  Initial CC: back pain  Hospital Course: Mr. Stumph is a 72 yo male with PMH severe lumbar spinal stenosis s/p multiple surgeries, mild intermittent asthma, HTN, OA who presented with worsening back pain.  Patient reported persistent lower back pain since last lumbar surgery in August 2023.  He has been wheelchair-bound since last year with chronic urinary incontinence and wears adult briefs. He had recent MRI L-spine performed on 10/12/2022.  This was notable for edema in the L4/5 vertebral bodies raising concern for possible infection. On admission, he underwent IR evaluation and tissue culture obtained from the L4/5 intervertebral space.  Interval History:  No events overnight.  PICC line placed yesterday.  Cultures growing Staph capitis but still in progress.  Remains on Ancef and Dapto.  Daughter is preferring discharge to [place] in [place], disposition planning still ongoing.  Assessment and Plan: * Discitis of lumbar region - s/p worsening low back pain recently - LE weakness/paresthesias baseline still - s/p L4/5 intervertebral disc space aspiration performed on 10/31/2022 -Culture growing rare Staph capitis, no anaerobes but being re-incubated  -Plan per ID is to remain on Ancef and daptomycin for now - PICC placed on 11/01/22  Urinary incontinence - chronic  Abnormal gait - Now wheelchair-bound   Old records reviewed in assessment of this patient  Antimicrobials:   DVT prophylaxis:  enoxaparin (LOVENOX) injection 40 mg Start: 10/30/22 1800   Code Status:   Code Status: Full Code  Mobility Assessment (last 72 hours)     Mobility Assessment     Row Name 11/01/22 2000 11/01/22 1958 11/01/22 1648 11/01/22 1036 10/31/22 1400   Does patient have an order for bedrest or is patient medically unstable No - Continue assessment No - Continue assessment -- No  - Continue assessment No - Continue assessment   What is the highest level of mobility based on the progressive mobility assessment? Level 2 (Chairfast) - Balance while sitting on edge of bed and cannot stand Level 2 (Chairfast) - Balance while sitting on edge of bed and cannot stand Level 2 (Chairfast) - Balance while sitting on edge of bed and cannot stand Level 2 (Chairfast) - Balance while sitting on edge of bed and cannot stand Level 2 (Chairfast) - Balance while sitting on edge of bed and cannot stand   Is the above level different from baseline mobility prior to current illness? Yes - Recommend PT order Yes - Recommend PT order -- Yes - Recommend PT order --    [place] Name 10/31/22 1347           Does patient have an order for bedrest or is patient medically unstable No - Continue assessment       What is the highest level of mobility based on the progressive mobility assessment? Level 2 (Chairfast) - Balance while sitting on edge of bed and cannot stand       Is the above level different from baseline mobility prior to current illness? Yes - Recommend PT order                Barriers to discharge:  Disposition Plan: SNF  Status is: Inpatient  Objective: Blood pressure 137/69, pulse 63, temperature 98.1 F (36.7 C), temperature source Oral, resp. rate 17, height 5\' 11"  (1.803 m), weight 127 kg, SpO2 95 %.  Examination:  Physical  Exam Constitutional:      General: He is not in acute distress.    Appearance: Normal appearance.  HENT:     Head: Normocephalic and atraumatic.     Mouth/Throat:     Mouth: Mucous membranes are moist.  Eyes:     Extraocular Movements: Extraocular movements intact.  Cardiovascular:     Rate and Rhythm: Normal rate and regular rhythm.     Heart sounds: Normal heart sounds.  Pulmonary:     Effort: Pulmonary effort is normal. No respiratory distress.     Breath sounds: Normal breath sounds. No wheezing.  Abdominal:     General: Bowel sounds are  normal. There is no distension.     Palpations: Abdomen is soft.     Tenderness: There is no abdominal tenderness.  Musculoskeletal:     Cervical back: Normal range of motion and neck supple.  Skin:    General: Skin is warm and dry.  Neurological:     Mental Status: He is alert.     Comments: Baseline 1-2/5 LE strength; noted paresthesias in B/L LE  Psychiatric:        Mood and Affect: Mood normal.        Behavior: Behavior normal.      Consultants:  ID  Procedures:  10/31/22: IR biopsy of L4/5 intervertebral disc space  Data Reviewed: Results for orders placed or performed during the hospital encounter of 10/30/22 (from the past 24 hour(s))  CBC with Differential/Platelet     Status: Abnormal   Collection Time: 11/02/22  3:07 AM  Result Value Ref Range   WBC 3.9 (L) 4.0 - 10.5 K/uL   RBC 4.49 4.22 - 5.81 MIL/uL   Hemoglobin 13.3 13.0 - 17.0 g/dL   HCT 39.2 39.0 - 52.0 %   MCV 87.3 80.0 - 100.0 fL   MCH 29.6 26.0 - 34.0 pg   MCHC 33.9 30.0 - 36.0 g/dL   RDW 14.0 11.5 - 15.5 %   Platelets 120 (L) 150 - 400 K/uL   nRBC 0.0 0.0 - 0.2 %   Neutrophils Relative % 41 %   Neutro Abs 1.6 (L) 1.7 - 7.7 K/uL   Lymphocytes Relative 44 %   Lymphs Abs 1.7 0.7 - 4.0 K/uL   Monocytes Relative 12 %   Monocytes Absolute 0.5 0.1 - 1.0 K/uL   Eosinophils Relative 2 %   Eosinophils Absolute 0.1 0.0 - 0.5 K/uL   Basophils Relative 1 %   Basophils Absolute 0.0 0.0 - 0.1 K/uL   Immature Granulocytes 0 %   Abs Immature Granulocytes 0.01 0.00 - 0.07 K/uL  Magnesium     Status: Abnormal   Collection Time: 11/02/22  3:07 AM  Result Value Ref Range   Magnesium 1.6 (L) 1.7 - 2.4 mg/dL  Comprehensive metabolic panel     Status: Abnormal   Collection Time: 11/02/22  3:07 AM  Result Value Ref Range   Sodium 136 135 - 145 mmol/L   Potassium 3.3 (L) 3.5 - 5.1 mmol/L   Chloride 99 98 - 111 mmol/L   CO2 27 22 - 32 mmol/L   Glucose, Bld 114 (H) 70 - 99 mg/dL   BUN 15 8 - 23 mg/dL   Creatinine,  Ser 0.98 0.61 - 1.24 mg/dL   Calcium 9.0 8.9 - 10.3 mg/dL   Total Protein 6.9 6.5 - 8.1 g/dL   Albumin 3.1 (L) 3.5 - 5.0 g/dL   AST 22 15 - 41 U/L   ALT  13 0 - 44 U/L   Alkaline Phosphatase 31 (L) 38 - 126 U/L   Total Bilirubin 0.5 0.3 - 1.2 mg/dL   GFR, Estimated >60 >60 mL/min   Anion gap 10 5 - 15    I have reviewed pertinent nursing notes, vitals, labs, and images as necessary. I have ordered labwork to follow up on as indicated.  I have reviewed the last notes from staff over past 24 hours. I have discussed patient's care plan and test results with nursing staff, CM/SW, and other staff as appropriate.  Time spent: Greater than 50% of the 55 minute visit was spent in counseling/coordination of care for the patient as laid out in the A&P.   LOS: 2 days   Dwyane Dee, MD [facility] 11/02/2022, 3:50 PM

## 2022-11-02 NOTE — Progress Notes (Signed)
[facility] for Infectious Disease  Date of Admission:  10/30/2022      Total days of antibiotics 3  Daptomycin 1/30 >  Ceftriaxone 1/30 >            ASSESSMENT: Evan Morgan is a 72 y.o. male with acute on chronic worsened back pain over 4 weeks. Extensive history of back surgeries most recent in Aug 2023 where he has HW in place.  MRI revealed L4/5 with edema w/o osseous fusion concerning for either pseudoarthrosis vs infection. His Inflammatory markers were at least mildly elevated ESR 54, CRP 6.2. Cultures from his back aspiration is growing out staphylococcal capitus -- all of which are increasingly more concerning infection is playing a role in abnormal MRI. Will continue daptomycin and narrow ceftriaxone to cefazolin with further determination based on susceptibilities. Would favor continuing treatment for standard course 6 weeks IV then conversion to PO for suppression given HW in place.  PICC line is in place. Hopeful can have what he needs for D/C in next 24-48h.    PLAN: Antibiotics: daptomycin + cefazolin  PICC in place  Planning to d/c back to snf.    Principal Problem:   Discitis of lumbar region Active Problems:   Abnormal gait   Discitis   Urinary incontinence   Hardware complicating wound infection (HCC)    atenolol  50 mg Oral Daily   Chlorhexidine Gluconate Cloth  6 each Topical Daily   enoxaparin (LOVENOX) injection  40 mg Subcutaneous Q24H   finasteride  5 mg Oral Daily   gabapentin  900 mg Oral QHS   hydrALAZINE  10 mg Oral Daily   hydrochlorothiazide  25 mg Oral Daily   senna  1 tablet Oral BID   sodium chloride flush  10-40 mL Intracatheter Q12H   tamsulosin  0.8 mg Oral Daily    SUBJECTIVE: No complaints today. Doing fine on abx.   Review of Systems: Review of Systems  Constitutional:  Negative for chills and fever.  Gastrointestinal:  Negative for abdominal pain, nausea and vomiting.  Skin:  Negative for itching and rash.     Allergies  Allergen Reactions   Amlodipine Other (See Comments)    Unknown reaction    Codeine Nausea And Vomiting   Lisinopril Cough    OBJECTIVE: Vitals:   11/01/22 1943 11/02/22 0342 11/02/22 0530 11/02/22 0749  BP: (!) 140/73 119/68 125/66 137/69  Pulse: 66 68 65 63  Resp: 17 16 17    Temp: 98.3 F (36.8 C) 97.9 F (36.6 C) 98 F (36.7 C) 98.1 F (36.7 C)  TempSrc: Oral Oral  Oral  SpO2: 100% 98% 97% 95%  Weight:      Height:       Body mass index is 39.05 kg/m.  Physical Exam Vitals reviewed.  Cardiovascular:     Rate and Rhythm: Normal rate and regular rhythm.  Pulmonary:     Breath sounds: Normal breath sounds.  Musculoskeletal:        General: Normal range of motion.  Skin:    Capillary Refill: Capillary refill takes less than 2 seconds.  Neurological:     Mental Status: He is alert and oriented to person, place, and time.     Lab Results Lab Results  Component Value Date   WBC 3.9 (L) 11/02/2022   HGB 13.3 11/02/2022   HCT 39.2 11/02/2022   MCV 87.3 11/02/2022   PLT 120 (L) 11/02/2022  Lab Results  Component Value Date   CREATININE 0.98 11/02/2022   BUN 15 11/02/2022   NA 136 11/02/2022   K 3.3 (L) 11/02/2022   CL 99 11/02/2022   CO2 27 11/02/2022    Lab Results  Component Value Date   ALT 13 11/02/2022   AST 22 11/02/2022   ALKPHOS 31 (L) 11/02/2022   BILITOT 0.5 11/02/2022     Microbiology: Recent Results (from the past 240 hour(s))  Culture, blood (routine x 2)     Status: None (Preliminary result)   Collection Time: 10/30/22  3:35 PM   Specimen: BLOOD  Result Value Ref Range Status   Specimen Description BLOOD RIGHT ANTECUBITAL  Final   Special Requests   Final    BOTTLES DRAWN AEROBIC AND ANAEROBIC Blood Culture adequate volume   Culture   Final    NO GROWTH 3 DAYS Performed at [facility], [place] [address] [place], [place] [address]    Report Status PENDING  Incomplete  Aerobic/Anaerobic Culture w Gram Stain  (surgical/deep wound)     Status: None (Preliminary result)   Collection Time: 10/31/22  2:23 PM   Specimen: Other Source; Tissue  Result Value Ref Range Status   Specimen Description OTHER  Final   Special Requests  INTERVERTEBAL DISC  Final   Gram Stain   Final    NO WBC SEEN NO ORGANISMS SEEN Performed at [facility], [address], [place], [place] [address]    Culture   Final    RARE STAPHYLOCOCCUS CAPITIS SUSCEPTIBILITIES TO FOLLOW NO ANAEROBES ISOLATED; CULTURE IN PROGRESS FOR 5 DAYS    Report Status PENDING  Incomplete     Janene Madeira, MSN, NP-C [facility] for Infectious Disease [email or web address] Pager: [phone] Office: [phone] [facility] Main Line: [place] Communication Welcome

## 2022-11-02 NOTE — Progress Notes (Signed)
Pt requested prayer followed by meaningful conversation. 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 Pt has family support 1 son and 1 daughter, 4 grandchildren he enjoys talking about.  Left Pt. In high spirits and hopeful. Pt was full of laughter by end of visit. Pt discussed after care desire to go to a different SNF then arrived from.  Pt was also given the Adv. Direct paperwork to discuss with his adult children.  Passed request verbally to [facility] visiting room.

## 2022-11-02 NOTE — Plan of Care (Signed)

## 2022-11-02 NOTE — TOC Progression Note (Signed)
Transition of Care [facility]) - Progression Note    Patient Details  Name: Evan Morgan MRN: [id number] Date of Birth: [birth year]  [facility] CM/SW Contact  Jinger Neighbors, [place] Phone Number: 11/02/2022, 11:46 AM  Clinical Narrative:     CSW re-faxed pt's [facility] referral and accompanying documents to the [place] at a different fax number provided by Sharyn Lull, [facility].  Expected Discharge Plan: [facility] Barriers to Discharge: Continued Medical Work up  Expected Discharge Plan and [place] arrangements for the past 2 months: [place]                                       Social Determinants of Health (SDOH) Interventions SDOH Screenings   Food Insecurity: No Food Insecurity (10/31/2022)  Housing: Low Risk  (10/31/2022)  Transportation Needs: No Transportation Needs (10/31/2022)  Utilities: Not At Risk (10/31/2022)  Tobacco Use: Low Risk  (10/31/2022)    Readmission Risk Interventions     No data to display

## 2022-11-03 DIAGNOSIS — M4640 Discitis, unspecified, site unspecified: Secondary | ICD-10-CM

## 2022-11-03 DIAGNOSIS — B958 Unspecified staphylococcus as the cause of diseases classified elsewhere: Secondary | ICD-10-CM | POA: Diagnosis not present

## 2022-11-03 DIAGNOSIS — T847XXD Infection and inflammatory reaction due to other internal orthopedic prosthetic devices, implants and grafts, subsequent encounter: Secondary | ICD-10-CM | POA: Diagnosis not present

## 2022-11-03 DIAGNOSIS — R768 Other specified abnormal immunological findings in serum: Secondary | ICD-10-CM | POA: Diagnosis not present

## 2022-11-03 DIAGNOSIS — M4646 Discitis, unspecified, lumbar region: Secondary | ICD-10-CM | POA: Diagnosis not present

## 2022-11-03 LAB — HCV RNA QUANT: HCV Quantitative: NOT DETECTED IU/mL (ref >50)

## 2022-11-03 MED ORDER — ACETAMINOPHEN 500 MG PO TABS: 500.0000 mg | ORAL_TABLET | Freq: Four times a day (QID) | ORAL | 0 refills | Status: AC | PRN

## 2022-11-03 MED ORDER — OXYCODONE-ACETAMINOPHEN 5-325 MG PO TABS: 1.0000 | ORAL_TABLET | Freq: Four times a day (QID) | ORAL | 0 refills | Status: AC | PRN

## 2022-11-03 MED ORDER — CEFAZOLIN IV (FOR PTA / DISCHARGE USE ONLY): 2.0000 g | Freq: Three times a day (TID) | INTRAVENOUS | 0 refills | Status: AC

## 2022-11-03 NOTE — Progress Notes (Signed)
Progress Note    Evan Morgan   NIO:[id number]  DOB: October 17, 1950  DOA: 10/30/2022     3 PCP: Charlsie Merles, MD  Initial CC: back pain  Hospital Course: Evan Morgan is a 72 yo male with PMH severe lumbar spinal stenosis s/p multiple surgeries, mild intermittent asthma, HTN, OA who presented with worsening back pain.  Patient reported persistent lower back pain since last lumbar surgery in August 2023.  Evan Morgan has been wheelchair-bound since last year with chronic urinary incontinence and wears adult briefs. Evan Morgan had recent MRI L-spine performed on 10/12/2022.  This was notable for edema in the L4/5 vertebral bodies raising concern for possible infection. On admission, Evan Morgan underwent IR evaluation and tissue culture obtained from the L4/5 intervertebral space.  Interval History:  No events overnight.  Still feels okay.  No change clinically.  No concerns or questions this morning when asked.  Assessment and Plan: * Discitis of lumbar region - s/p worsening low back pain recently - LE weakness/paresthesias baseline still - s/p L4/5 intervertebral disc space aspiration performed on 10/31/2022 -Culture growing rare Staph capitis, no anaerobes but being re-incubated  -Plan per ID is to remain on Ancef and daptomycin for now - PICC placed on 11/01/22  Urinary incontinence - chronic  Abnormal gait - Now wheelchair-bound   Old records reviewed in assessment of this patient  Antimicrobials: Rocephin 10/31/2022 >> 11/01/2022 Daptomycin 10/31/2022 >> current Ancef 11/02/2022 >> current  DVT prophylaxis:  enoxaparin (LOVENOX) injection 40 mg Start: 10/30/22 1800   Code Status:   Code Status: Full Code  Mobility Assessment (last 72 hours)     Mobility Assessment     Row Name 11/02/22 2050 11/02/22 0800 11/01/22 2000 11/01/22 1958 11/01/22 1648   Does patient have an order for bedrest or is patient medically unstable No - Continue assessment No - Continue assessment No - Continue assessment  No - Continue assessment --   What is the highest level of mobility based on the progressive mobility assessment? Level 2 (Chairfast) - Balance while sitting on edge of bed and cannot stand Level 2 (Chairfast) - Balance while sitting on edge of bed and cannot stand Level 2 (Chairfast) - Balance while sitting on edge of bed and cannot stand Level 2 (Chairfast) - Balance while sitting on edge of bed and cannot stand Level 2 (Chairfast) - Balance while sitting on edge of bed and cannot stand   Is the above level different from baseline mobility prior to current illness? Yes - Recommend PT order Yes - Recommend PT order Yes - Recommend PT order Yes - Recommend PT order --    [place] Name 11/01/22 1036 10/31/22 1400 10/31/22 1347       Does patient have an order for bedrest or is patient medically unstable No - Continue assessment No - Continue assessment No - Continue assessment     What is the highest level of mobility based on the progressive mobility assessment? Level 2 (Chairfast) - Balance while sitting on edge of bed and cannot stand Level 2 (Chairfast) - Balance while sitting on edge of bed and cannot stand Level 2 (Chairfast) - Balance while sitting on edge of bed and cannot stand     Is the above level different from baseline mobility prior to current illness? Yes - Recommend PT order -- Yes - Recommend PT order              Barriers to discharge:  Disposition Plan: SNF  Status  is: Inpatient  Objective: Blood pressure 134/71, pulse 61, temperature 98.3 F (36.8 C), temperature source Oral, resp. rate 18, height 5\' 11"  (1.803 m), weight 127 kg, SpO2 99 %.  Examination:  Physical Exam Constitutional:      General: Evan Morgan is not in acute distress.    Appearance: Normal appearance.  HENT:     Head: Normocephalic and atraumatic.     Mouth/Throat:     Mouth: Mucous membranes are moist.  Eyes:     Extraocular Movements: Extraocular movements intact.  Cardiovascular:     Rate and Rhythm:  Normal rate and regular rhythm.     Heart sounds: Normal heart sounds.  Pulmonary:     Effort: Pulmonary effort is normal. No respiratory distress.     Breath sounds: Normal breath sounds. No wheezing.  Abdominal:     General: Bowel sounds are normal. There is no distension.     Palpations: Abdomen is soft.     Tenderness: There is no abdominal tenderness.  Musculoskeletal:     Cervical back: Normal range of motion and neck supple.  Skin:    General: Skin is warm and dry.  Neurological:     Mental Status: Evan Morgan is alert.     Comments: B/L LE now 2-3/5 LE strength; noted paresthesias in B/L LE (baseline)  Psychiatric:        Mood and Affect: Mood normal.        Behavior: Behavior normal.      Consultants:  ID  Procedures:  10/31/22: IR biopsy of L4/5 intervertebral disc space  Data Reviewed: No results found for this or any previous visit (from the past 24 hour(s)).   I have reviewed pertinent nursing notes, vitals, labs, and images as necessary. I have ordered labwork to follow up on as indicated.  I have reviewed the last notes from staff over past 24 hours. I have discussed patient's care plan and test results with nursing staff, CM/SW, and other staff as appropriate.  Time spent: Greater than 50% of the 55 minute visit was spent in counseling/coordination of care for the patient as laid out in the A&P.   LOS: 3 days   Dwyane Dee, MD [facility] 11/03/2022, 12:07 PM

## 2022-11-03 NOTE — Progress Notes (Signed)
Physical Therapy Treatment Patient Details Name: Evan Morgan MRN: [id number] DOB: June 20, 1951 Today's Date: 11/03/2022   History of Present Illness Evan Morgan is a 72 yo male with PMH severe lumbar spinal stenosis s/p multiple surgeries (most recent 04/13/22 decomp/fusion), mild intermittent asthma, HTN, OA who presented with worsening back pain.  S/p L4-L5 disc space fluoroscopic guided aspiration in IR on 10/31/22 with workup for discitis.    PT Comments    Pt admitted with above diagnosis. Pt was able to sit EOB and continues to need mod to max assist of 2 for bed mobility and to attempt partial standing. Will continue to follow acutely. Agree with SNF for therapy.  Pt currently with functional limitations due to the deficits listed below (see PT Problem List). Pt will benefit from skilled PT to increase their independence and safety with mobility to allow discharge to the venue listed below.      Recommendations for follow up therapy are one component of a multi-disciplinary discharge planning process, led by the attending physician.  Recommendations may be updated based on patient status, additional functional criteria and insurance authorization.  Follow Up Recommendations  Skilled nursing-short term rehab (<3 hours/day) Can patient physically be transported by private vehicle: No   Assistance Recommended at Discharge Intermittent Supervision/Assistance  Patient can return home with the following A lot of help with bathing/dressing/bathroom;A lot of help with walking and/or transfers   Equipment Recommendations  None recommended by PT    Recommendations for Other Services       Precautions / Restrictions Precautions Precautions: Fall;Back Precaution Comments: for comfort Restrictions Weight Bearing Restrictions: No     Mobility  Bed Mobility Overal bed mobility: Needs Assistance Bed Mobility: Rolling, Sidelying to Sit, Supine to Sit, Sit to Supine Rolling: Mod assist, Max  assist, +2 for physical assistance Sidelying to sit: Max assist, +2 for physical assistance   Sit to supine: Max assist, +2 for physical assistance   General bed mobility comments: lifting help for trunk and assist for legs off bed to sit, assist for positioning.  max assist for back to bed.    Transfers Overall transfer level: Needs assistance   Transfers: Bed to chair/wheelchair/BSC, Sit to/from Stand Sit to Stand: Mod assist, Max assist, +2 physical assistance, From elevated surface           General transfer comment: along EOB moving toward [facility] with effort and time and only moving a little; Obtained Stedy and pt attempted x 4 to stand just clearing buttocks off of bed about 2 inches each time and not enough to place pads of Stedy behind pt.  Pt could not give enough effort even with +2 staff assist with use of gait belt and pad under pts buttocks.  Had to assist pt back to supine. Transfer via Lift Equipment: Stedy  Ambulation/Gait                   Stairs             Wheelchair Mobility    Modified Rankin (Stroke Patients Only)       Balance Overall balance assessment: Needs assistance Sitting-balance support: Feet supported, Bilateral upper extremity supported Sitting balance-Leahy Scale: Poor Sitting balance - Comments: Pt needed at least 1 Ue support to sit EOB with pt leaning anteriorly at times and needed cues to sit up tall. Then would lost balance posteriorly. Postural control: Posterior lean Standing balance support: Bilateral upper extremity supported, During functional activity Standing balance-Leahy  Scale: Zero Standing balance comment: Pt could not stand to [place] with 4 attempts and +2 assist.                            Cognition Arousal/Alertness: Awake/alert Behavior During Therapy: WFL for tasks assessed/performed Overall Cognitive Status: Within Functional Limits for tasks assessed                                           Exercises General Exercises - Lower Extremity Ankle Circles/Pumps: AROM, Both, 10 reps, Supine Quad Sets: AROM, Both, 10 reps, Supine Heel Slides: Both, 10 reps, Supine, AAROM, AROM    General Comments        Pertinent Vitals/Pain Pain Assessment Pain Assessment: Faces Faces Pain Scale: Hurts even more Pain Location: back Pain Descriptors / Indicators: Discomfort Pain Intervention(s): Limited activity within patient's tolerance, Monitored during session, Repositioned    Home Living                          Prior Function            PT Goals (current goals can now be found in the care plan section) Acute Rehab PT Goals Patient Stated Goal: to walk Progress towards PT goals: Not progressing toward goals - comment (could not stand fully)    Frequency    Min 2X/week      PT Plan Current plan remains appropriate    Co-evaluation              AM-PAC PT "6 Clicks" Mobility   Outcome Measure  Help needed turning from your back to your side while in a flat bed without using bedrails?: A Little Help needed moving from lying on your back to sitting on the side of a flat bed without using bedrails?: A Lot Help needed moving to and from a bed to a chair (including a wheelchair)?: A Lot Help needed standing up from a chair using your arms (e.g., wheelchair or bedside chair)?: Total Help needed to walk in hospital room?: Total Help needed climbing 3-5 steps with a railing? : Total 6 Click Score: 10    End of Session Equipment Utilized During Treatment: Gait belt Activity Tolerance: Patient limited by fatigue;Patient limited by pain Patient left: in bed;with bed alarm set;with call bell/phone within reach Nurse Communication: Mobility status;Need for lift equipment PT Visit Diagnosis: Other abnormalities of gait and mobility (R26.89);Other symptoms and signs involving the nervous system (R29.898);Other (comment) (paraparesis)     Time:  3810-1751 PT Time Calculation (min) (ACUTE ONLY): 19 min  Charges:  $Therapeutic Activity: 8-22 mins                     [facility] M,PT Acute Rehab Services [place] 11/03/2022, 4:47 PM

## 2022-11-03 NOTE — Progress Notes (Signed)
Patient is getting discharge back to facility, via stretcher. Personal belongings were handed to patient. Patient has no complaints or questions/concerns at this time.

## 2022-11-03 NOTE — Progress Notes (Addendum)
Subjective: No new complaints   Antibiotics:  Anti-infectives (From admission, onward)    Start     Dose/Rate Route Frequency Ordered Stop   11/02/22 1600  ceFAZolin (ANCEF) IVPB 2g/100 mL premix        2 g 200 mL/hr over 30 Minutes Intravenous Every 8 hours 11/02/22 1412     10/31/22 1600  cefTRIAXone (ROCEPHIN) 2 g in sodium chloride 0.9 % 100 mL IVPB  Status:  Discontinued        2 g 200 mL/hr over 30 Minutes Intravenous Every 24 hours 10/31/22 1437 11/02/22 1412   10/31/22 1600  DAPTOmycin (CUBICIN) 750 mg in sodium chloride 0.9 % IVPB  Status:  Discontinued        8 mg/kg  96 kg (Adjusted) 130 mL/hr over 30 Minutes Intravenous Daily 10/31/22 1507 11/03/22 1311       Medications: Scheduled Meds:  atenolol  50 mg Oral Daily   Chlorhexidine Gluconate Cloth  6 each Topical Daily   enoxaparin (LOVENOX) injection  40 mg Subcutaneous Q24H   finasteride  5 mg Oral Daily   gabapentin  900 mg Oral QHS   hydrALAZINE  10 mg Oral Daily   hydrochlorothiazide  25 mg Oral Daily   senna  1 tablet Oral BID   sodium chloride flush  10-40 mL Intracatheter Q12H   tamsulosin  0.8 mg Oral Daily   Continuous Infusions:   ceFAZolin (ANCEF) IV 2 g (11/03/22 0558)   PRN Meds:.acetaminophen, bisacodyl, methocarbamol, ondansetron, oxyCODONE-acetaminophen, sodium chloride flush    Objective: Weight change:   Intake/Output Summary (Last 24 hours) at 11/03/2022 1312 Last data filed at 11/03/2022 0605 Gross per 24 hour  Intake 445 ml  Output 925 ml  Net -480 ml   Blood pressure 134/71, pulse 61, temperature 98.3 F (36.8 C), temperature source Oral, resp. rate 18, height 5\' 11"  (1.803 m), weight 127 kg, SpO2 99 %. Temp:  [98 F (36.7 C)-98.3 F (36.8 C)] 98.3 F (36.8 C) (02/02 0728) Pulse Rate:  [58-63] 61 (02/02 0728) Resp:  [17-18] 18 (02/02 0728) BP: (127-137)/(71-75) 134/71 (02/02 0728) SpO2:  [98 %-100 %] 99 % (02/02 0728)  Physical Exam: Physical  Exam Constitutional:      Appearance: He is well-developed.  HENT:     Head: Normocephalic and atraumatic.  Eyes:     Conjunctiva/sclera: Conjunctivae normal.  Cardiovascular:     Rate and Rhythm: Normal rate and regular rhythm.  Pulmonary:     Effort: Pulmonary effort is normal. No respiratory distress.     Breath sounds: No wheezing.  Abdominal:     General: There is no distension.     Palpations: Abdomen is soft.  Musculoskeletal:        General: Normal range of motion.     Cervical back: Normal range of motion and neck supple.  Skin:    General: Skin is warm and dry.     Findings: No erythema or rash.  Neurological:     General: No focal deficit present.     Mental Status: He is alert and oriented to person, place, and time.  Psychiatric:        Mood and Affect: Mood normal.        Behavior: Behavior normal.        Thought Content: Thought content normal.        Judgment: Judgment normal.      CBC:    BMET Recent Labs  11/01/22 0818 11/02/22 0307  NA 136 136  K 3.7 3.3*  CL 102 99  CO2 25 27  GLUCOSE 96 114*  BUN 12 15  CREATININE 0.96 0.98  CALCIUM 8.9 9.0     Liver Panel  Recent Labs    11/02/22 0307  PROT 6.9  ALBUMIN 3.1*  AST 22  ALT 13  ALKPHOS 31*  BILITOT 0.5       Sedimentation Rate Recent Labs    10/31/22 1410  ESRSEDRATE 55*   C-Reactive Protein Recent Labs    10/31/22 1410  CRP 0.7    Micro Results: Recent Results (from the past 720 hour(s))  Culture, blood (routine x 2)     Status: None (Preliminary result)   Collection Time: 10/30/22  3:35 PM   Specimen: BLOOD  Result Value Ref Range Status   Specimen Description BLOOD RIGHT ANTECUBITAL  Final   Special Requests   Final    BOTTLES DRAWN AEROBIC AND ANAEROBIC Blood Culture adequate volume   Culture   Final    NO GROWTH 4 DAYS Performed at [facility], [place] [facility] [address] [place], [place] [address]    Report Status PENDING  Incomplete   Aerobic/Anaerobic Culture w Gram Stain (surgical/deep wound)     Status: None (Preliminary result)   Collection Time: 10/31/22  2:23 PM   Specimen: Other Source; Tissue  Result Value Ref Range Status   Specimen Description OTHER  Final   Special Requests  INTERVERTEBAL DISC  Final   Gram Stain   Final    NO WBC SEEN NO ORGANISMS SEEN Performed at [facility], [address] [place], [place] [address]    Culture   Final    RARE STAPHYLOCOCCUS CAPITIS NO ANAEROBES ISOLATED; CULTURE IN PROGRESS FOR 5 DAYS    Report Status PENDING  Incomplete   Organism ID, Bacteria STAPHYLOCOCCUS CAPITIS  Final      Susceptibility   Staphylococcus capitis - MIC*    CIPROFLOXACIN <=0.5 SENSITIVE Sensitive     ERYTHROMYCIN <=0.25 SENSITIVE Sensitive     GENTAMICIN <=0.5 SENSITIVE Sensitive     OXACILLIN <=0.25 SENSITIVE Sensitive     TETRACYCLINE <=1 SENSITIVE Sensitive     VANCOMYCIN <=0.5 SENSITIVE Sensitive     TRIMETH/SULFA <=10 SENSITIVE Sensitive     CLINDAMYCIN <=0.25 SENSITIVE Sensitive     RIFAMPIN <=0.5 SENSITIVE Sensitive     Inducible Clindamycin NEGATIVE Sensitive     * RARE STAPHYLOCOCCUS CAPITIS    Studies/Results: No results found.    Assessment/Plan:  INTERVAL HISTORY:  cultures with MS Staph capitis   Principal Problem:   Discitis of lumbar region Active Problems:   Abnormal gait   Discitis   Urinary incontinence   Hardware complicating wound infection (HCC)   Hepatitis C antibody positive in blood    Evan Morgan is a 72 y.o. male with history of spinal surgery with hardware in place now found to have evidence of vertebral osteomyelitis discitis status post IR guided aspirate which yielded Staphylococcus capitis which is sensitive to cefazolin.  #1 hardware associated osteomyelitis discitis:  Will plan on 6 weeks of cefazolin and then transition to oral antibiotics in the form of cefadroxil.   Diagnosis: Hardware associated osteomyelitis  discitis  Culture Result: Staphylococcus capitis  Allergies  Allergen Reactions   Amlodipine Other (See Comments)    Unknown reaction    Codeine Nausea And Vomiting   Lisinopril Cough    OPAT Orders Discharge antibiotics to  be given via PICC line Discharge antibiotics: Cefazolin 2 g IV Q \ 8 hours Duration: 6 weeks End Date: 12/11/2022  [facility] Care Per Protocol:  Home health RN for IV administration and teaching; PICC line care and labs.    Labs weekly while on IV antibiotics: _x_ CBC with differential _x_ BMP  _x_ CRP _x_ ESR  _x_ Please pull PIC at completion of IV antibiotics __ Please leave PIC in place until doctor has seen patient or been notified  Fax weekly labs to [phone]  Clinic Follow Up Appt:    Evan Morgan has an appointment on 11/21/22 at 17 AM with Dr. Tommy Medal  at  [facility], which  is located in the [facility] at  [address] in [place].  [address], which is located to the left of the elevators.  Phone: [phone]  Fax: [phone]  [email or web address]  The patient should arrive 30 minutes prior to their appoitment.  I spent 53 minutes with the patient including than 50% of the time in face to face counseling of the patient re his HW assoc osteomyelitis, diskitis,  along with review of medical records in preparation for the visit and during the visit and in coordination of his care.  I will sign off for now please call with further questions.   LOS: 3 days   Alcide Evener 11/03/2022, 1:12 PM

## 2022-11-03 NOTE — TOC Progression Note (Addendum)
Transition of Care [facility]) - Progression Note    Patient Details  Name: DARVELL MONTEFORTE MRN: [id number] Date of Birth: 08-12-1951  Transition [facility]) CM/SW Contact  Jinger Neighbors, [place] Phone Number: 11/03/2022, 12:07 PM  Clinical Narrative:     CSW called:  KimberlyStephens at [place] team- left v.m [place] [facility], [facility] team- left v.m [facility] team- left v.m Program line (two times)- no aswer CSW called [facility], pt's dtr to make her aware of attempts. [place] who reports Manuela Schwartz will f/u with CSW.  CSW received a returned call from [place] at the [place] who reports they're not contracted with the [place] rehab in [place] and referred to the list of the 8 providers. CSW emailed the list of SNFs to [place], pt's dtr for review. CSW met with pt at bedside to discuss and called pt's dtr via phone. CSW also called Whitney at [facility]- admission [place] who reports they do not have any beds. Pt wants to stay local and decided he will return to [facility], with the intent to transfer to another facility- preferably [facility]. CSW consulted with Dr. Sabino Gasser who reports pt is ready and inquired about IV antibiotics. CSW contacted Roselyn Reef at [facility] who reports they can do the IV antibiotics. CSW will work on d/c when d/c summary is in.    Expected Discharge Plan: [place] Barriers to Discharge: Continued Medical Work up  Expected Discharge Plan and [place] arrangements for the past 2 months: [place]                                       Social Determinants of Health (SDOH) Interventions SDOH Screenings   Food Insecurity: No Food Insecurity (10/31/2022)  Housing: Low Risk  (10/31/2022)  Transportation Needs: No Transportation Needs (10/31/2022)  Utilities: Not At Risk (10/31/2022)  Tobacco Use: Low Risk  (10/31/2022)    Readmission Risk Interventions     No data to display

## 2022-11-03 NOTE — TOC Initial Note (Signed)
Transition of Care [facility]) - Initial/Assessment Note    Patient Details  Name: Evan Morgan MRN: [id number] Date of Birth: 06/05/1951  [facility]) CM/SW Contact:    Evan Neighbors, LCSW Phone Number: 11/03/2022, 2:56 PM  Clinical Narrative:                  Pt will be returning to [facility] via [facility].  Call to report: [phone] Room: [place] Evan Morgan- pt's dtr to make her aware of d/c.   [facility] has been called.    Expected Discharge Plan: Skilled Nursing Facility Barriers to Discharge: No Barriers Identified   Patient Goals and CMS Choice Patient states their goals for this hospitalization and ongoing recovery are:: Pt would like to get "good physical therapy so he can walk again". CMS Medicare.gov Compare Post Acute Care list provided to:: Patient Choice offered to / list presented to : Patient, Adult Children      Expected Discharge Plan and Services       Living arrangements for the past 2 months: [facility] Expected Discharge Date: 11/03/22                                    Prior Living Arrangements/Services Living arrangements for the past 2 months: [place] Lives with:: Facility Resident Patient language and need for interpreter reviewed:: Yes Do you feel safe going back to the place where you live?: Yes      Need for Family Participation in Patient Care: Yes (Comment) Care giver support system in place?: Yes (comment)   Criminal Activity/Legal Involvement Pertinent to Current Situation/Hospitalization: No - Comment as needed  Activities of Daily Living Home Assistive Devices/Equipment: Wheelchair, Eyeglasses, Built-in shower seat, Transfer board ADL Screening (condition at time of admission) Patient's cognitive ability adequate to safely complete daily activities?: Yes Is the patient deaf or have difficulty hearing?: Yes Does the patient have difficulty seeing, even when wearing glasses/contacts?:  Yes Does the patient have difficulty concentrating, remembering, or making decisions?: No Patient able to express need for assistance with ADLs?: Yes Does the patient have difficulty dressing or bathing?: Yes Independently performs ADLs?: No Communication: Independent Dressing (OT): Needs assistance Is this a change from baseline?: Pre-admission baseline Grooming: Needs assistance Is this a change from baseline?: Pre-admission baseline Feeding: Independent Bathing: Needs assistance Is this a change from baseline?: Pre-admission baseline Toileting: Needs assistance Is this a change from baseline?: Pre-admission baseline In/Out Bed: Needs assistance Is this a change from baseline?: Pre-admission baseline Walks in Home: Dependent Is this a change from baseline?: Pre-admission baseline Does the patient have difficulty walking or climbing stairs?: Yes Weakness of Legs: Both Weakness of Arms/Hands: Both  Permission Sought/Granted                  Emotional Assessment Appearance:: Appears stated age Attitude/Demeanor/Rapport: Engaged, Ambitious Affect (typically observed): Accepting, Appropriate, Calm Orientation: : Oriented to Self, Oriented to Place, Oriented to  Time, Oriented to Situation Alcohol / Substance Use: Not Applicable Psych Involvement: No (comment)  Admission diagnosis:  Discitis of lumbar region [M46.46] Back pain, unspecified back location, unspecified back pain laterality, unspecified chronicity [M54.9] Discitis [M46.40] Patient Active Problem List   Diagnosis Date Noted   Staphylococcus infection 11/03/2022   Hepatitis C antibody positive in blood 11/02/2022   Vertebral osteomyelitis ([place]) 10/31/2022   Urinary incontinence 14/43/1540   Hardware complicating wound  infection ([place]) 10/31/2022   Discitis 10/30/2022   Discitis of lumbar region 10/30/2022   Post-op pain 05/13/2022   Intractable low back pain 05/13/2022   Radiculopathy 07/23/2019   Abnormal  gait 09/03/2017   Bilateral leg numbness 09/03/2017   Muscle spasm 11/07/2016   BPH (benign prostatic hyperplasia) 08/30/2016   GERD (gastroesophageal reflux disease) 08/30/2016   HTN (hypertension) 08/30/2016   S/P lumbar fusion 08/27/2015   DDD (degenerative disc disease), lumbar 04/01/2015   Foraminal stenosis of lumbar region 04/01/2015   Spinal fusion failure 04/01/2015   Midline low back pain with sciatica 12/16/2014   PCP:  Charlsie Merles, MD Pharmacy:   [facility], [address] [place] [address] Phone: [phone] Fax: [place], [place] [address] Phone: [phone] Fax: [phone]     Social Determinants of Health (SDOH) Social History: [place]: No Food Insecurity (10/31/2022)  Housing: Low Risk  (10/31/2022)  Transportation Needs: No Transportation Needs (10/31/2022)  Utilities: Not At Risk (10/31/2022)  Tobacco Use: Low Risk  (10/31/2022)   SDOH Interventions: Housing Interventions: Intervention Not Indicated   Readmission Risk Interventions     No data to display

## 2022-11-03 NOTE — Plan of Care (Signed)
  Problem: Education: Goal: Knowledge of General Education information will improve Description: Including pain rating scale, medication(s)/side effects and non-pharmacologic comfort measures 11/03/2022 0328 by Ocie Doyne, RN Outcome: Progressing 11/03/2022 0313 by Ocie Doyne, RN Outcome: Progressing   Problem: Clinical Measurements: Goal: Will remain free from infection Outcome: Progressing   Problem: Activity: Goal: Risk for activity intolerance will decrease Outcome: Progressing   Problem: Safety: Goal: Ability to remain free from injury will improve Outcome: Progressing   Problem: Skin Integrity: Goal: Risk for impaired skin integrity will decrease Outcome: Progressing

## 2022-11-03 NOTE — Progress Notes (Signed)
   11/03/22 1400  Spiritual Encounters  Type of Visit Initial  Care provided to: Patient  Referral source Patient request  Reason for visit Advance directives  OnCall Visit No   Ch responded to call to assist pt with AD. Pt declined because he wanted his daughter to help him fill out AD. Ch tells pt to notify the nurse when AD is ready. No follow needed at this time.

## 2022-11-03 NOTE — Discharge Summary (Addendum)
Physician Discharge Summary   HILLEL CARD DUK:[id number] DOB: 20-Mar-1951 DOA: 10/30/2022  PCP: Sherwood Gambler, MD  Admit date: 10/30/2022 Discharge date: 11/03/2022 Barriers to discharge: none  Admitted From: [facility] Disposition:  [facility] Discharging physician: Lewie Chamber, MD  Recommendations for Outpatient Follow-up:  Weekly labs on abx: CBC w/ diff, BMP, CRP, ESR Pull PICC once abx are complete Follow up with ID, Dr. Daiva Eves on 11/21/22 at 9:30 am   Discharge Condition: stable CODE STATUS: Full Diet recommendation:  Diet Orders (From admission, onward)     Start     Ordered   11/03/22 0000  Diet general        11/03/22 1410   10/31/22 1506  Diet regular Fluid consistency: Thin  Diet effective now       Question:  Fluid consistency:  Answer:  Thin   10/31/22 1505            Hospital Course: Mr. Burdin is a 72 yo male with PMH severe lumbar spinal stenosis s/p multiple surgeries, mild intermittent asthma, HTN, OA who presented with worsening back pain.  Patient reported persistent lower back pain since last lumbar surgery in August 2023.  He has been wheelchair-bound since last year with chronic urinary incontinence and wears adult briefs. He had recent MRI L-spine performed on 10/12/2022.  This was notable for edema in the L4/5 vertebral bodies raising concern for possible infection. On admission, he underwent IR evaluation and tissue culture obtained from the L4/5 intervertebral space. Cultures grew Staph capitis and he was de-escalated to Ancef to continue course at discharge per ID recommendations.  Assessment and Plan: * Discitis of lumbar region - s/p worsening low back pain recently - LE weakness/paresthesias baseline still - s/p L4/5 intervertebral disc space aspiration performed on 10/31/2022 -Culture growing rare Staph capitis, no anaerobes -Patient treated with Ancef and daptomycin during hospitalization.  He was continued on monotherapy Ancef per  ID recommendations with course to complete on 12/11/2022. - PICC placed on 11/01/22 - weekly labs: CBC w/ diff, BMP, CRP, ESR  Urinary incontinence - chronic  Abnormal gait - Now wheelchair-bound   The patient's chronic medical conditions were treated accordingly per the patient's home medication regimen except as noted.  On day of discharge, patient was felt deemed stable for discharge. Patient/family member advised to call PCP or come back to ER if needed.   Principal Diagnosis: Discitis of lumbar region  Discharge Diagnoses: Active Hospital Problems   Diagnosis Date Noted   Discitis of lumbar region 10/30/2022    Priority: 1.   Staphylococcus infection 11/03/2022   Hepatitis C antibody positive in blood 11/02/2022   Urinary incontinence 10/31/2022   Hardware complicating wound infection (HCC) 10/31/2022   Discitis 10/30/2022   Abnormal gait 09/03/2017    Resolved Hospital Problems  No resolved problems to display.     Discharge Instructions     [facility] pharmacist to adjust dose for Vancomycin, Aminoglycosides and other anti-infective therapies as requested by physician.   Complete by: As directed    [facility] to provide Cath Flo 2mg    Complete by: As directed    Administer for PICC line occlusion and as ordered by physician for other access device issues.   Anaphylaxis Kit: Provided to treat any anaphylactic reaction to the medication being provided to the patient if First Dose or when requested by physician   Complete by: As directed    Epinephrine 1mg /ml vial / amp: Administer 0.3mg  (0.35ml)  subcutaneously once for moderate to severe anaphylaxis, nurse to call physician and pharmacy when reaction occurs and call 911 if needed for immediate care   Diphenhydramine 50mg /ml IV vial: Administer 25-50mg  IV/IM PRN for first dose reaction, rash, itching, mild reaction, nurse to call physician and pharmacy when reaction occurs   Sodium Chloride 0.9% NS  575ml IV: Administer if needed for hypovolemic blood pressure drop or as ordered by physician after call to physician with anaphylactic reaction   Change dressing on IV access line weekly and PRN   Complete by: As directed    Diet general   Complete by: As directed    Flush IV access with Sodium Chloride 0.9% and Heparin 10 units/ml or 100 units/ml   Complete by: As directed    Home infusion instructions - [facility]   Complete by: As directed    Instructions: Flush IV access with Sodium Chloride 0.9% and Heparin 10units/ml or 100units/ml   Change dressing on IV access line: Weekly and PRN   Instructions Cath Flo 2mg : Administer for PICC Line occlusion and as ordered by physician for other access device   [facility] pharmacist to adjust dose for: Vancomycin, Aminoglycosides and other anti-infective therapies as requested by physician   Increase activity slowly   Complete by: As directed    Method of administration may be changed at the discretion of home infusion pharmacist based upon assessment of the patient and/or caregiver's ability to self-administer the medication ordered   Complete by: As directed    No wound care   Complete by: As directed       Allergies as of 11/03/2022       Reactions   Amlodipine Other (See Comments)   Unknown reaction    Codeine Nausea And Vomiting   Lisinopril Cough        Medication List     TAKE these medications    acetaminophen 500 MG tablet Commonly known as: TYLENOL Take 1 tablet (500 mg total) by mouth every 6 (six) hours as needed for moderate pain. What changed: how much to take   atenolol 50 MG tablet Commonly known as: TENORMIN Take 50 mg by mouth daily.   ceFAZolin  IVPB Commonly known as: ANCEF Inject 2 g into the vein every 8 (eight) hours. Indication:  osteomyelitis First Dose: Yes Last Day of Therapy:  12/11/22- Pull PICC at completion of IV abx  Labs - Once weekly:  CBC/D and BMP, Labs - Every  other week:  ESR and CRP Method of administration: IV Push Method of administration may be changed at the discretion of home infusion pharmacist based upon assessment of the patient and/or caregiver's ability to self-administer the medication ordered.   Cholecalciferol 50 MCG (2000 UT) Tabs Take 2,000 Units by mouth daily.   Dulcolax 10 MG suppository Generic drug: bisacodyl Place 10 mg rectally daily as needed for constipation.   finasteride 5 MG tablet Commonly known as: PROSCAR Take 5 mg by mouth daily.   gabapentin 300 MG capsule Commonly known as: NEURONTIN Take 900 mg by mouth at bedtime.   hydrALAZINE 10 MG tablet Commonly known as: APRESOLINE Take 10 mg by mouth daily.   hydrochlorothiazide 25 MG tablet Commonly known as: HYDRODIURIL Take 25 mg by mouth daily.   magnesium hydroxide 400 MG/5ML suspension Commonly known as: MILK OF MAGNESIA Take 30 mLs by mouth daily as needed for mild constipation.   methocarbamol 500 MG tablet Commonly known as: ROBAXIN Take 1-2 tablets (  500-1,000 mg total) by mouth every 6 (six) hours as needed for muscle spasms.   nystatin-triamcinolone ointment Commonly known as: MYCOLOG Apply 1 Application topically daily as needed for dry skin.   ondansetron 4 MG tablet Commonly known as: ZOFRAN Take 1 tablet (4 mg total) by mouth every 6 (six) hours as needed for nausea.   oxyCODONE-acetaminophen 5-325 MG tablet Commonly known as: PERCOCET/ROXICET Take 1 tablet by mouth every 6 (six) hours as needed for moderate pain or severe pain.   Senna Laxative 8.6 MG tablet Generic drug: senna Take 1 tablet by mouth 2 (two) times daily. Hold for loose stools.   tamsulosin 0.4 MG Caps capsule Commonly known as: FLOMAX Take 0.8 mg by mouth daily.               Discharge Care Instructions  (From admission, onward)           Start     Ordered   11/03/22 0000  Change dressing on IV access line weekly and PRN  (Home infusion  instructions - [facility] )        11/03/22 1408            Allergies  Allergen Reactions   Amlodipine Other (See Comments)    Unknown reaction    Codeine Nausea And Vomiting   Lisinopril Cough    Consultations: ID  Procedures:   Discharge Exam: BP 129/68 (BP Location: Left Arm)   Pulse (!) 58   Temp 98.4 F (36.9 C) (Oral)   Resp 18   Ht 5\' 11"  (1.803 m)   Wt 127 kg   SpO2 100%   BMI 39.05 kg/m  Physical Exam Constitutional:      General: He is not in acute distress.    Appearance: Normal appearance.  HENT:     Head: Normocephalic and atraumatic.     Mouth/Throat:     Mouth: Mucous membranes are moist.  Eyes:     Extraocular Movements: Extraocular movements intact.  Cardiovascular:     Rate and Rhythm: Normal rate and regular rhythm.     Heart sounds: Normal heart sounds.  Pulmonary:     Effort: Pulmonary effort is normal. No respiratory distress.     Breath sounds: Normal breath sounds. No wheezing.  Abdominal:     General: Bowel sounds are normal. There is no distension.     Palpations: Abdomen is soft.     Tenderness: There is no abdominal tenderness.  Musculoskeletal:     Cervical back: Normal range of motion and neck supple.  Skin:    General: Skin is warm and dry.  Neurological:     Mental Status: He is alert.     Comments: B/L LE now 2-3/5 LE strength; noted paresthesias in B/L LE (baseline)  Psychiatric:        Mood and Affect: Mood normal.        Behavior: Behavior normal.      The results of significant diagnostics from this hospitalization (including imaging, microbiology, ancillary and laboratory) are listed below for reference.   Microbiology: Recent Results (from the past 240 hour(s))  Culture, blood (routine x 2)     Status: None (Preliminary result)   Collection Time: 10/30/22  3:35 PM   Specimen: BLOOD  Result Value Ref Range Status   Specimen Description BLOOD RIGHT ANTECUBITAL  Final   Special Requests   Final     BOTTLES DRAWN AEROBIC AND ANAEROBIC Blood Culture adequate volume  Culture   Final    NO GROWTH 4 DAYS Performed at [facility], [place] [address], [place], [place] [address]    Report Status PENDING  Incomplete  Aerobic/Anaerobic Culture w Gram Stain (surgical/deep wound)     Status: None (Preliminary result)   Collection Time: 10/31/22  2:23 PM   Specimen: Other Source; Tissue  Result Value Ref Range Status   Specimen Description OTHER  Final   Special Requests  INTERVERTEBAL DISC  Final   Gram Stain   Final    NO WBC SEEN NO ORGANISMS SEEN Performed at [facility], [address], [place], [place] [address]    Culture   Final    RARE STAPHYLOCOCCUS CAPITIS NO ANAEROBES ISOLATED; CULTURE IN PROGRESS FOR 5 DAYS    Report Status PENDING  Incomplete   Organism ID, Bacteria STAPHYLOCOCCUS CAPITIS  Final      Susceptibility   Staphylococcus capitis - MIC*    CIPROFLOXACIN <=0.5 SENSITIVE Sensitive     ERYTHROMYCIN <=0.25 SENSITIVE Sensitive     GENTAMICIN <=0.5 SENSITIVE Sensitive     OXACILLIN <=0.25 SENSITIVE Sensitive     TETRACYCLINE <=1 SENSITIVE Sensitive     VANCOMYCIN <=0.5 SENSITIVE Sensitive     TRIMETH/SULFA <=10 SENSITIVE Sensitive     CLINDAMYCIN <=0.25 SENSITIVE Sensitive     RIFAMPIN <=0.5 SENSITIVE Sensitive     Inducible Clindamycin NEGATIVE Sensitive     * RARE STAPHYLOCOCCUS CAPITIS     Labs: BNP (last 3 results) No results for input(s): "BNP" in the last 8760 hours. Basic Metabolic Panel: Recent Labs  Lab 10/30/22 1535 11/01/22 0818 11/02/22 0307  NA 136 136 136  K 3.6 3.7 3.3*  CL 100 102 99  CO2 29 25 27   GLUCOSE 112* 96 114*  BUN 11 12 15   CREATININE 0.88 0.96 0.98  CALCIUM 9.4 8.9 9.0  MG  --  1.7 1.6*   Liver Function Tests: Recent Labs  Lab 10/30/22 1535 11/02/22 0307  AST 21 22  ALT 15 13  ALKPHOS 34* 31*  BILITOT 0.5 0.5  PROT 7.8 6.9  ALBUMIN 3.6 3.1*   No results for input(s): "LIPASE", "AMYLASE" in the  last 168 hours. No results for input(s): "AMMONIA" in the last 168 hours. CBC: Recent Labs  Lab 10/30/22 1535 11/01/22 0818 11/02/22 0307  WBC 3.9* 3.8* 3.9*  NEUTROABS 1.5* 1.5* 1.6*  HGB 14.6 13.3 13.3  HCT 43.8 39.8 39.2  MCV 88.1 87.1 87.3  PLT 123* 125* 120*   Cardiac Enzymes: Recent Labs  Lab 11/01/22 0818  CKTOTAL 76   BNP: Invalid input(s): "POCBNP" CBG: No results for input(s): "GLUCAP" in the last 168 hours. D-Dimer No results for input(s): "DDIMER" in the last 72 hours. Hgb A1c No results for input(s): "HGBA1C" in the last 72 hours. Lipid Profile No results for input(s): "CHOL", "HDL", "LDLCALC", "TRIG", "CHOLHDL", "LDLDIRECT" in the last 72 hours. Thyroid function studies No results for input(s): "TSH", "T4TOTAL", "T3FREE", "THYROIDAB" in the last 72 hours.  Invalid input(s): "FREET3" Anemia work up No results for input(s): "VITAMINB12", "FOLATE", "FERRITIN", "TIBC", "IRON", "RETICCTPCT" in the last 72 hours. Urinalysis    Component Value Date/Time   COLORURINE YELLOW 10/30/2022 2346   APPEARANCEUR HAZY (A) 10/30/2022 2346   LABSPEC 1.017 10/30/2022 2346   PHURINE 6.0 10/30/2022 2346   GLUCOSEU NEGATIVE 10/30/2022 2346   HGBUR NEGATIVE 10/30/2022 2346   [place] 10/30/2022 2346   [place] 10/30/2022 2346   PROTEINUR NEGATIVE  10/30/2022 2346   NITRITE POSITIVE (A) 10/30/2022 2346   LEUKOCYTESUR MODERATE (A) 10/30/2022 2346   Sepsis Labs Recent Labs  Lab 10/30/22 1535 11/01/22 0818 11/02/22 0307  WBC 3.9* 3.8* 3.9*   Microbiology Recent Results (from the past 240 hour(s))  Culture, blood (routine x 2)     Status: None (Preliminary result)   Collection Time: 10/30/22  3:35 PM   Specimen: BLOOD  Result Value Ref Range Status   Specimen Description BLOOD RIGHT ANTECUBITAL  Final   Special Requests   Final    BOTTLES DRAWN AEROBIC AND ANAEROBIC Blood Culture adequate volume   Culture   Final    NO GROWTH 4  DAYS Performed at [facility], [address], [place], [place] [address]    Report Status PENDING  Incomplete  Aerobic/Anaerobic Culture w Gram Stain (surgical/deep wound)     Status: None (Preliminary result)   Collection Time: 10/31/22  2:23 PM   Specimen: Other Source; Tissue  Result Value Ref Range Status   Specimen Description OTHER  Final   Special Requests  INTERVERTEBAL DISC  Final   Gram Stain   Final    NO WBC SEEN NO ORGANISMS SEEN Performed at [facility], [address] [place], [place] [address]    Culture   Final    RARE STAPHYLOCOCCUS CAPITIS NO ANAEROBES ISOLATED; CULTURE IN PROGRESS FOR 5 DAYS    Report Status PENDING  Incomplete   Organism ID, Bacteria STAPHYLOCOCCUS CAPITIS  Final      Susceptibility   Staphylococcus capitis - MIC*    CIPROFLOXACIN <=0.5 SENSITIVE Sensitive     ERYTHROMYCIN <=0.25 SENSITIVE Sensitive     GENTAMICIN <=0.5 SENSITIVE Sensitive     OXACILLIN <=0.25 SENSITIVE Sensitive     TETRACYCLINE <=1 SENSITIVE Sensitive     VANCOMYCIN <=0.5 SENSITIVE Sensitive     TRIMETH/SULFA <=10 SENSITIVE Sensitive     CLINDAMYCIN <=0.25 SENSITIVE Sensitive     RIFAMPIN <=0.5 SENSITIVE Sensitive     Inducible Clindamycin NEGATIVE Sensitive     * RARE STAPHYLOCOCCUS CAPITIS    Procedures/Studies: [place] EKG SITE RITE  Result Date: 11/01/2022 If Site Rite image not attached, placement could not be confirmed due to current cardiac rhythm.  MR LUMBAR SPINE WO CONTRAST  Result Date: 10/13/2022 CLINICAL DATA:  Unable to bear weight, evaluate for spinal stenosis. History of back surgery. EXAM: MRI LUMBAR SPINE WITHOUT CONTRAST TECHNIQUE: Multiplanar, multisequence MR imaging of the lumbar spine was performed. No intravenous contrast was administered. COMPARISON:  Same-day CT, CT lumbar spine 05/14/2022, MR lumbar spine 05/13/2022 FINDINGS: Segmentation: Standard; the lowest formed disc space is designated L5-S1. Alignment:  Normal. Vertebrae:  Vertebral body heights are preserved. Background marrow signal is normal. Postsurgical changes reflecting posterior instrumented fusion from L3 through L5 with interbody spacers at L2-L3 through L4-L5, bilateral laminectomy at L3-L4, and left laminectomy at L4-L5 are noted. There are ghost tracks from prior L2 pedicle screws. There is no evidence of osseous fusion across the disc spaces on the same-day CT. Perihardware lucency around the L5 pedicle screws is better seen on the CT. There is edema within the vertebral bodies at L4 and L5 which is increased since the prior MRI. There is no convincing evidence of infection. There is no suspicious marrow signal abnormality. Conus medullaris and cauda equina: Conus extends to the L1 level. Conus and cauda equina appear normal. Paraspinal and other soft tissues: Bilateral renal cysts are noted requiring no specific imaging  follow-up. Postsurgical changes are noted in the soft tissues posteriorly in the lower lumbar spine. Previously seen fluid in the soft tissues has essentially resolved. Disc levels: T12-L1: No significant spinal canal or neural foraminal stenosis. L1-L2: No significant spinal canal or neural foraminal stenosis. L2-L3: Previous interbody fusion without significant spinal canal or neural foraminal stenosis. L3-L4: Status post posterior instrumented fusion and decompression. There is unchanged T2 hyperintense material in the dorsal aspect of the canal measuring up to 5 mm in thickness favored to reflect granulation tissue based on the prior contrast enhanced lumbar spine MRI. There is unchanged degenerative endplate spurring and right eccentric disc bulge. Findings result in unchanged mild spinal canal stenosis and moderate to severe right worse than left neural foraminal stenosis. L4-L5: Status post posterior instrumented fusion and left laminectomy. There is a persistent disc bulge, degenerative endplate spurring, and bulky facet arthropathy with  ligamentum flavum thickening resulting in severe spinal canal stenosis with impingement of the cauda equina nerve roots, and moderate to severe right and moderate left neural foraminal stenosis. L5-S1: There is left worse than right endplate spurring and facet arthropathy resulting in unchanged moderate to severe left and mild-to-moderate right neural foraminal stenosis without significant spinal canal stenosis. IMPRESSION: 1. Postsurgical changes reflecting lumbar instrumented fusion and decompression as above. Lucency around the bilateral L5 pedicle screws is better seen on the same-day CT. 2. Edema in the L4 and L5 vertebral bodies without osseous fusion on the CT raises the possibility of pseudoarthrosis. Infection is not entirely excluded. 3. Persistent severe spinal canal stenosis with impingement of the cauda equina nerve roots at L4-L5, and moderate to severe right and moderate left neural foraminal stenosis at this level. 4. Unchanged mild spinal canal stenosis and moderate to severe right worse than left neural foraminal stenosis at L3-L4, and moderate to severe left and mild-to-moderate right neural foraminal stenosis at L5-S1. Electronically Signed   By: Lesia Hausen M.D.   On: 10/13/2022 12:33   CT LUMBAR SPINE WO CONTRAST  Result Date: 10/12/2022 CLINICAL DATA:  Low back pain EXAM: CT LUMBAR SPINE WITHOUT CONTRAST TECHNIQUE: Multidetector CT imaging of the lumbar spine was performed without intravenous contrast administration. Multiplanar CT image reconstructions were also generated. RADIATION DOSE REDUCTION: This exam was performed according to the departmental dose-optimization program which includes automated exposure control, adjustment of the mA and/or kV according to patient size and/or use of iterative reconstruction technique. COMPARISON:  05/14/2022 FINDINGS: Segmentation: The lowest lumbar type non-rib-bearing vertebra is labeled as L5. Alignment: 2 mm degenerative anterolisthesis at  L3-4. Vertebrae: Posterolateral rod and pedicle screw fixation bilaterally at L3-L4-L5. There is prominent lucency around the L5 pedicle screws indicating loosening or infection, and new compared to 05/14/2022. There is also some minimal lucency around the right L3 pedicle screw which could reflect very early loosening or infection. Interbody spacer graft at L3 observed with surrounding lucency an without well-defined bony interbody bridging, but with clear bony bridging between the left facet joint at the L3-4 level. Posterior decompression at L3-4. Interbody spacer device with some adjacent graft but surrounding lucency along the endplates at L4-5. The lucency along the endplates and around the spacer is increased at this level. Interbody graft at L2-3 noted as well. Screw tracks from prior pedicle screws at L2 noted. No acute fracture identified. Paraspinal and other soft tissues: Suspected trace right pleural fluid with adjacent atelectasis. Fluid density lesions in the kidneys are likely cysts but are not completely included on today's exam and  are suboptimally characterized. None of the findings on today's exam indicates a need for follow up of these lesions. Atherosclerosis is present, including aortoiliac atherosclerotic disease. Disc levels: L1-2: No impingement.  Mild disc bulge. L2-3: No impingement.  Interbody graft material at this level. L3-4: There is at least moderate bilateral osseous foraminal stenosis due to intervertebral and facet spurring. Interbody graft material with surrounding lucency L4-5: Moderate right and mild left foraminal stenosis due to facet and intervertebral spurring. Partial facetectomy on the left. The interbody spacer and surrounding graph no high of surrounding lucency along the vertebral endplates, a change compared to 05/14/2022, and raising the possibility of pseudoarthrosis. There is some new fragmentation of the left superior articular facet at L5, as shown on images 55  through 58 of series 7, this fragmentation was not observed on the CT from 05/14/2022. L5-S1: No impingement identified.  Mild disc bulge. IMPRESSION: 1. The dominant change compared 05/14/2022 is at the L4-5 level, where there is new prominent lucency around the L5 pedicle screws (indicating loosening or infection); new lucency along the L4-5 endplates surrounding the interbody spacer and graft material raising the possibility of pseudoarthrosis; as well as new fragmentation of the remaining superior articular facet of L5 on the left. 2. At least moderate bilateral osseous foraminal stenosis at L3-4 and moderate right and mild left foraminal stenosis at L4-5 due to spurring. 3. Suspected trace right pleural fluid with adjacent atelectasis. 4. Aortic atherosclerosis. Aortic Atherosclerosis (ICD10-I70.0). Electronically Signed   By: Van Clines M.D.   On: 10/12/2022 15:28     Time coordinating discharge: Over 30 minutes    Dwyane Dee, MD  [facility] 11/03/2022, 4:05 PM

## 2022-11-04 LAB — CULTURE, BLOOD (ROUTINE X 2)
Culture: NO GROWTH
Special Requests: ADEQUATE

## 2022-11-05 LAB — AEROBIC/ANAEROBIC CULTURE W GRAM STAIN (SURGICAL/DEEP WOUND): Gram Stain: NONE SEEN

## 2022-11-15 ENCOUNTER — Observation Stay (HOSPITAL_COMMUNITY): Admit: 2022-11-15 | Discharge: 2022-11-15 | Disposition: A | Discharge status: Home / Self Care | Payer: Medicare PPO

## 2022-11-15 ENCOUNTER — Encounter (HOSPITAL_COMMUNITY): Payer: Medicare PPO

## 2022-11-15 ENCOUNTER — Inpatient Hospital Stay (HOSPITAL_COMMUNITY)
Admission: EM | Admit: 2022-11-15 | Discharge: 2022-11-22 | DRG: 315 | Disposition: A | Discharge status: Skilled Nursing Facility | Payer: Medicare PPO | Attending: Internal Medicine | Admitting: Internal Medicine

## 2022-11-15 ENCOUNTER — Encounter (HOSPITAL_COMMUNITY): Payer: Self-pay

## 2022-11-15 ENCOUNTER — Other Ambulatory Visit: Payer: Self-pay

## 2022-11-15 DIAGNOSIS — E669 Obesity, unspecified: Secondary | ICD-10-CM | POA: Diagnosis present

## 2022-11-15 DIAGNOSIS — Z8619 Personal history of other infectious and parasitic diseases: Secondary | ICD-10-CM

## 2022-11-15 DIAGNOSIS — J4520 Mild intermittent asthma, uncomplicated: Secondary | ICD-10-CM | POA: Diagnosis not present

## 2022-11-15 DIAGNOSIS — E876 Hypokalemia: Secondary | ICD-10-CM | POA: Diagnosis present

## 2022-11-15 DIAGNOSIS — I82B11 Acute embolism and thrombosis of right subclavian vein: Secondary | ICD-10-CM | POA: Diagnosis present

## 2022-11-15 DIAGNOSIS — I82621 Acute embolism and thrombosis of deep veins of right upper extremity: Secondary | ICD-10-CM | POA: Diagnosis present

## 2022-11-15 DIAGNOSIS — N400 Enlarged prostate without lower urinary tract symptoms: Secondary | ICD-10-CM | POA: Diagnosis present

## 2022-11-15 DIAGNOSIS — Z888 Allergy status to other drugs, medicaments and biological substances status: Secondary | ICD-10-CM

## 2022-11-15 DIAGNOSIS — T82868A Thrombosis of vascular prosthetic devices, implants and grafts, initial encounter: Secondary | ICD-10-CM | POA: Diagnosis not present

## 2022-11-15 DIAGNOSIS — M4646 Discitis, unspecified, lumbar region: Secondary | ICD-10-CM | POA: Diagnosis not present

## 2022-11-15 DIAGNOSIS — D696 Thrombocytopenia, unspecified: Secondary | ICD-10-CM | POA: Diagnosis present

## 2022-11-15 DIAGNOSIS — I82409 Acute embolism and thrombosis of unspecified deep veins of unspecified lower extremity: Secondary | ICD-10-CM | POA: Diagnosis present

## 2022-11-15 DIAGNOSIS — I82A11 Acute embolism and thrombosis of right axillary vein: Secondary | ICD-10-CM | POA: Diagnosis present

## 2022-11-15 DIAGNOSIS — M25512 Pain in left shoulder: Secondary | ICD-10-CM | POA: Diagnosis present

## 2022-11-15 DIAGNOSIS — Z993 Dependence on wheelchair: Secondary | ICD-10-CM

## 2022-11-15 DIAGNOSIS — Z981 Arthrodesis status: Secondary | ICD-10-CM

## 2022-11-15 DIAGNOSIS — Z79899 Other long term (current) drug therapy: Secondary | ICD-10-CM

## 2022-11-15 DIAGNOSIS — M170 Bilateral primary osteoarthritis of knee: Secondary | ICD-10-CM | POA: Diagnosis present

## 2022-11-15 DIAGNOSIS — R52 Pain, unspecified: Secondary | ICD-10-CM | POA: Diagnosis not present

## 2022-11-15 DIAGNOSIS — M25511 Pain in right shoulder: Secondary | ICD-10-CM | POA: Diagnosis present

## 2022-11-15 DIAGNOSIS — Z6836 Body mass index (BMI) 36.0-36.9, adult: Secondary | ICD-10-CM

## 2022-11-15 DIAGNOSIS — I10 Essential (primary) hypertension: Secondary | ICD-10-CM | POA: Diagnosis present

## 2022-11-15 DIAGNOSIS — J45909 Unspecified asthma, uncomplicated: Secondary | ICD-10-CM | POA: Diagnosis present

## 2022-11-15 DIAGNOSIS — I82C11 Acute embolism and thrombosis of right internal jugular vein: Secondary | ICD-10-CM | POA: Diagnosis present

## 2022-11-15 DIAGNOSIS — Z885 Allergy status to narcotic agent status: Secondary | ICD-10-CM

## 2022-11-15 DIAGNOSIS — Y848 Other medical procedures as the cause of abnormal reaction of the patient, or of later complication, without mention of misadventure at the time of the procedure: Secondary | ICD-10-CM | POA: Diagnosis present

## 2022-11-15 DIAGNOSIS — R32 Unspecified urinary incontinence: Secondary | ICD-10-CM | POA: Diagnosis present

## 2022-11-15 LAB — BASIC METABOLIC PANEL
Anion gap: 6 (ref 5–15)
BUN: 12 mg/dL (ref 8–23)
CO2: 28 mmol/L (ref 22–32)
Calcium: 8.9 mg/dL (ref 8.9–10.3)
Chloride: 101 mmol/L (ref 98–111)
Creatinine, Ser: 0.81 mg/dL (ref 0.61–1.24)
GFR, Estimated: >60 mL/min (ref >60)
Glucose, Bld: 100 mg/dL — ABNORMAL HIGH (ref 70–99)
Potassium: 3.4 mmol/L — ABNORMAL LOW (ref 3.5–5.1)
Sodium: 135 mmol/L (ref 135–145)

## 2022-11-15 LAB — HEPATIC FUNCTION PANEL
ALT: 6 U/L (ref 0–44)
AST: 22 U/L (ref 15–41)
Albumin: 3.4 g/dL — ABNORMAL LOW (ref 3.5–5.0)
Alkaline Phosphatase: 35 U/L — ABNORMAL LOW (ref 38–126)
Bilirubin, Direct: 0.1 mg/dL (ref 0.0–0.2)
Indirect Bilirubin: 0.4 mg/dL (ref 0.3–0.9)
Total Bilirubin: 0.5 mg/dL (ref 0.3–1.2)
Total Protein: 7.7 g/dL (ref 6.5–8.1)

## 2022-11-15 LAB — CBC WITH DIFFERENTIAL/PLATELET
Abs Immature Granulocytes: 0.02 10*3/uL (ref 0.00–0.07)
Basophils Absolute: 0 10*3/uL (ref 0.0–0.1)
Basophils Relative: 1 %
Eosinophils Absolute: 0.1 10*3/uL (ref 0.0–0.5)
Eosinophils Relative: 3 %
HCT: 40.4 % (ref 39.0–52.0)
Hemoglobin: 13.2 g/dL (ref 13.0–17.0)
Immature Granulocytes: 1 %
Lymphocytes Relative: 37 %
Lymphs Abs: 1.6 10*3/uL (ref 0.7–4.0)
MCH: 29.5 pg (ref 26.0–34.0)
MCHC: 32.7 g/dL (ref 30.0–36.0)
MCV: 90.2 fL (ref 80.0–100.0)
Monocytes Absolute: 0.6 10*3/uL (ref 0.1–1.0)
Monocytes Relative: 13 %
Neutro Abs: 2 10*3/uL (ref 1.7–7.7)
Neutrophils Relative %: 45 %
Platelets: 104 10*3/uL — ABNORMAL LOW (ref 150–400)
RBC: 4.48 MIL/uL (ref 4.22–5.81)
RDW: 14.1 % (ref 11.5–15.5)
WBC: 4.3 10*3/uL (ref 4.0–10.5)
nRBC: 0 % (ref 0.0–0.2)

## 2022-11-15 LAB — PHOSPHORUS: Phosphorus: 3.4 mg/dL (ref 2.5–4.6)

## 2022-11-15 LAB — CK: Total CK: 75 U/L (ref 49–397)

## 2022-11-15 LAB — MAGNESIUM: Magnesium: 1.6 mg/dL — ABNORMAL LOW (ref 1.7–2.4)

## 2022-11-15 LAB — PROTIME-INR
INR: 1.2 (ref 0.8–1.2)
Prothrombin Time: 14.6 seconds (ref 11.4–15.2)

## 2022-11-15 LAB — APTT: aPTT: 41 seconds — ABNORMAL HIGH (ref 24–36)

## 2022-11-15 LAB — LACTIC ACID, PLASMA: Lactic Acid, Venous: 1.1 mmol/L (ref 0.5–1.9)

## 2022-11-15 MED ORDER — HYDRALAZINE HCL 10 MG PO TABS
10.0000 mg | ORAL_TABLET | Freq: Every day | ORAL | Status: DC
  Administered 2022-11-16 – 2022-11-22 (×7): 10 mg via ORAL
  Filled 2022-11-15 (×7): qty 1

## 2022-11-15 MED ORDER — HEPARIN (PORCINE) 25000 UT/250ML-% IV SOLN
1700.0000 [IU]/h | INTRAVENOUS | Status: DC
  Administered 2022-11-15 – 2022-11-21 (×10): 1700 [IU]/h via INTRAVENOUS
  Filled 2022-11-15 (×10): qty 250

## 2022-11-15 MED ORDER — SODIUM CHLORIDE 0.9 % IV SOLN: INTRAVENOUS | Status: AC

## 2022-11-15 MED ORDER — CEFAZOLIN SODIUM-DEXTROSE 2-4 GM/100ML-% IV SOLN
2.0000 g | Freq: Once | INTRAVENOUS | Status: AC
  Administered 2022-11-16: 2 g via INTRAVENOUS
  Filled 2022-11-15 (×2): qty 100

## 2022-11-15 MED ORDER — HEPARIN BOLUS VIA INFUSION
4000.0000 [IU] | Freq: Once | INTRAVENOUS | Status: AC
  Administered 2022-11-15: 4000 [IU] via INTRAVENOUS
  Filled 2022-11-15: qty 4000

## 2022-11-15 MED ORDER — ACETAMINOPHEN 325 MG PO TABS: 650.0000 mg | ORAL_TABLET | Freq: Four times a day (QID) | ORAL | Status: DC | PRN

## 2022-11-15 MED ORDER — OXYCODONE-ACETAMINOPHEN 5-325 MG PO TABS
1.0000 | ORAL_TABLET | Freq: Four times a day (QID) | ORAL | Status: DC | PRN
  Administered 2022-11-16 – 2022-11-22 (×8): 1 via ORAL
  Filled 2022-11-15 (×8): qty 1

## 2022-11-15 MED ORDER — SENNA 8.6 MG PO TABS
1.0000 | ORAL_TABLET | Freq: Two times a day (BID) | ORAL | Status: DC
  Administered 2022-11-16: 8.6 mg via ORAL
  Filled 2022-11-15: qty 1

## 2022-11-15 MED ORDER — ATENOLOL 25 MG PO TABS
50.0000 mg | ORAL_TABLET | Freq: Every day | ORAL | Status: DC
  Administered 2022-11-16 – 2022-11-22 (×7): 50 mg via ORAL
  Filled 2022-11-15 (×7): qty 2

## 2022-11-15 MED ORDER — TAMSULOSIN HCL 0.4 MG PO CAPS
0.8000 mg | ORAL_CAPSULE | Freq: Every day | ORAL | Status: DC
  Administered 2022-11-16 – 2022-11-22 (×7): 0.8 mg via ORAL
  Filled 2022-11-15 (×7): qty 2

## 2022-11-15 MED ORDER — CEFAZOLIN SODIUM-DEXTROSE 2-4 GM/100ML-% IV SOLN
2.0000 g | Freq: Three times a day (TID) | INTRAVENOUS | Status: DC
  Administered 2022-11-16 – 2022-11-22 (×19): 2 g via INTRAVENOUS
  Filled 2022-11-15 (×21): qty 100

## 2022-11-15 MED ORDER — POTASSIUM CHLORIDE CRYS ER 20 MEQ PO TBCR
40.0000 meq | EXTENDED_RELEASE_TABLET | Freq: Once | ORAL | Status: AC
  Administered 2022-11-16: 40 meq via ORAL
  Filled 2022-11-15: qty 2

## 2022-11-15 MED ORDER — ALBUTEROL SULFATE (2.5 MG/3ML) 0.083% IN NEBU: 2.5000 mg | INHALATION_SOLUTION | RESPIRATORY_TRACT | Status: DC | PRN

## 2022-11-15 MED ORDER — MAGNESIUM SULFATE 2 GM/50ML IV SOLN
2.0000 g | Freq: Once | INTRAVENOUS | Status: AC
  Administered 2022-11-16: 2 g via INTRAVENOUS
  Filled 2022-11-15: qty 50

## 2022-11-15 MED ORDER — FINASTERIDE 5 MG PO TABS
5.0000 mg | ORAL_TABLET | Freq: Every day | ORAL | Status: DC
  Administered 2022-11-16 – 2022-11-22 (×7): 5 mg via ORAL
  Filled 2022-11-15 (×7): qty 1

## 2022-11-15 MED ORDER — METHOCARBAMOL 500 MG PO TABS: 500.0000 mg | ORAL_TABLET | Freq: Four times a day (QID) | ORAL | Status: DC | PRN

## 2022-11-15 MED ORDER — ACETAMINOPHEN 650 MG RE SUPP: 650.0000 mg | Freq: Four times a day (QID) | RECTAL | Status: DC | PRN

## 2022-11-15 MED ORDER — GABAPENTIN 300 MG PO CAPS
900.0000 mg | ORAL_CAPSULE | Freq: Every day | ORAL | Status: DC
  Administered 2022-11-16 – 2022-11-21 (×7): 900 mg via ORAL
  Filled 2022-11-15 (×7): qty 3

## 2022-11-15 MED ORDER — HYDROCODONE-ACETAMINOPHEN 5-325 MG PO TABS: 1.0000 | ORAL_TABLET | ORAL | Status: DC | PRN

## 2022-11-15 NOTE — Progress Notes (Signed)
Per unit RN pt has multiple clots in upper right arm and is questioning use of established PICC. Advised to reach out to Dr regarding use of PICC and chest x-ray if needed to verify placement. Pt has 2 other PIV and no further access is needed at this time. Unit RN to place another consult for IV team if needed

## 2022-11-15 NOTE — ED Notes (Signed)
U/s at bedside

## 2022-11-15 NOTE — H&P (Signed)
Evan Morgan [id number] DOB: 1950/12/18 DOA: 11/15/2022     PCP: Charlsie Merles, MD   Outpatient Specialists:   ID Dr. Tommy Medal Patient arrived to ER on 11/15/22 at 1741 Referred by Attending Long, Wonda Olds, MD   Patient coming from:     From facility [facility]  Chief Complaint:   Chief Complaint  Patient presents with   Arm Swelling    Right arm (PICC line)    HPI: Evan Morgan is a 72 y.o. male with medical history significant of  HTN asthma, diskitis intermittent asthma, hypertension urinary incontinence history of hepatitis C    Presented with right arm swelling for the past 2 to 3 days Pt has R arm PICC was dc to [place] for ancef for discitis Patient has known history of severe spinal canal stenosis status post multiple surgeries last one was done in August 2023 has been wheelchair-bound since then they have chronic urinary incontinence in January presented for worsening back pain MRI done showed discitis versus osteomyelitis of L4-5 He underwent IR evaluation and cultures grew Staph capitis he was changed to Ancef PICC line was placed on 01 November 2022 by PICC team and he was discharged to nursing home on Ancef every 8 hours until December 11, 2022      Regarding pertinent Chronic problems:      HTN on atenolol, hydralazine, hydrochlorothiazide BPH on Proscar and Flomax      Asthma -well   controlled on home inhalers/ nebs f                         While in ER: Clinical Course as of 11/15/22 2055  Wed Nov 15, 2022  1945 Per Dr. Carlis Abbott, vascular surgery, no intervention for PICC at this time. Anticoagulate on heparin and can continue using PICC.  [LA]    Clinical Course User Index [LA] Mickie Hillier, PA-C        US doppler showed acute deep vein thrombosis involving the right internal  jugular  vein, right subclavian vein, right axillary vein and right brachial veins.   Following Medications were ordered in ER: Medications  heparin ADULT  infusion 100 units/mL (25000 units/276m) (1,700 Units/hr Intravenous New Bag/Given 11/15/22 2044)  heparin bolus via infusion 4,000 Units (4,000 Units Intravenous Bolus from Bag 11/15/22 2044)    _______________________________________________________ ER Provider Called:   Vascular  Dr. CCarlis AbbottThey Recommend admit to medicine   Heparin drip   ED Triage Vitals  Enc Vitals Group     BP 11/15/22 1748 139/65     Pulse Rate 11/15/22 1748 65     Resp 11/15/22 1748 18     Temp 11/15/22 1748 98.3 F (36.8 C)     Temp Source 11/15/22 1748 Oral     SpO2 11/15/22 1748 99 %     Weight 11/15/22 1751 263 lb (119.3 kg)     Height 11/15/22 1751 5' 11"$  (1.803 m)     Head Circumference --      Peak Flow --      Pain Score 11/15/22 1750 9     Pain Loc --      Pain Edu? --      [place]cl. in [place] --   TMAX(24)@     _________________________________________ Significant initial  Findings: Abnormal Labs Reviewed  BASIC METABOLIC PANEL - Abnormal; Notable for the following components:      Result Value   Potassium 3.4 (*)  Glucose, Bld 100 (*)    All other components within normal limits  CBC WITH DIFFERENTIAL/PLATELET - Abnormal; Notable for the following components:   Platelets 104 (*)    All other components within normal limits      The recent clinical data is shown below. Vitals:   11/15/22 1915 11/15/22 1930 11/15/22 1945 11/15/22 2030  BP: 119/73 130/66 124/63 121/76  Pulse: 71 63 65 61  Resp: 16 18 18 18  $ Temp:      TempSrc:      SpO2: 100% 98% 98% 96%  Weight:      Height:         WBC     Component Value Date/Time   WBC 4.3 11/15/2022 1858   LYMPHSABS 1.6 11/15/2022 1858   MONOABS 0.6 11/15/2022 1858   EOSABS 0.1 11/15/2022 1858   BASOSABS 0.0 11/15/2022 1858    Lactic Acid, Venous    Component Value Date/Time   LATICACIDVEN 1.1 11/15/2022 1858       UA  ordered    Results for orders placed or performed during the hospital encounter of 10/30/22  Culture, blood  (routine x 2)     Status: None   Collection Time: 10/30/22  3:35 PM   Specimen: BLOOD  Result Value Ref Range Status   Specimen Description BLOOD RIGHT ANTECUBITAL  Final   Special Requests   Final    BOTTLES DRAWN AEROBIC AND ANAEROBIC Blood Culture adequate volume   Culture   Final    NO GROWTH 5 DAYS Performed at [facility], [place] [address], [place], [place] [address]    Report Status 11/04/2022 FINAL  Final  Aerobic/Anaerobic Culture w Gram Stain (surgical/deep wound)     Status: None   Collection Time: 10/31/22  2:23 PM   Specimen: Other Source; Tissue  Result Value Ref Range Status   Specimen Description OTHER  Final   Special Requests  INTERVERTEBAL DISC  Final   Gram Stain NO WBC SEEN NO ORGANISMS SEEN   Final   Culture   Final    RARE STAPHYLOCOCCUS CAPITIS NO ANAEROBES ISOLATED Performed at [facility], [address], [place], [place] [address]    Report Status 11/05/2022 FINAL  Final   Organism ID, Bacteria STAPHYLOCOCCUS CAPITIS  Final      Susceptibility   Staphylococcus capitis - MIC*    CIPROFLOXACIN <=0.5 SENSITIVE Sensitive     ERYTHROMYCIN <=0.25 SENSITIVE Sensitive     GENTAMICIN <=0.5 SENSITIVE Sensitive     OXACILLIN <=0.25 SENSITIVE Sensitive     TETRACYCLINE <=1 SENSITIVE Sensitive     VANCOMYCIN <=0.5 SENSITIVE Sensitive     TRIMETH/SULFA <=10 SENSITIVE Sensitive     CLINDAMYCIN <=0.25 SENSITIVE Sensitive     RIFAMPIN <=0.5 SENSITIVE Sensitive     Inducible Clindamycin NEGATIVE Sensitive     * RARE STAPHYLOCOCCUS CAPITIS     _______________________________________________ Hospitalist was called for admission for   Acute deep vein thrombosis (DVT) of right upper extremity, unspecified vein (HCC)    The following Work up has been ordered so far:  Orders Placed This Encounter  Procedures   Culture, blood (routine x 2)   Basic metabolic panel   CBC with Differential   APTT   Protime-INR   CBC   Heparin level (unfractionated)    Consult to vascular surgery  [id number]   heparin per pharmacy consult   Consult to hospitalist  [id number]   UE VENOUS DUPLEX (7am - 7pm)  OTHER Significant initial  Findings:  labs showing:  Recent Labs  Lab 11/15/22 1858  NA 135  K 3.4*  CO2 28  GLUCOSE 100*  BUN 12  CREATININE 0.81  CALCIUM 8.9  MG 1.6*  PHOS 3.4    Cr   stable,  Lab Results  Component Value Date   CREATININE 0.81 11/15/2022   CREATININE 0.98 11/02/2022   CREATININE 0.96 11/01/2022    Recent Labs  Lab 11/15/22 1858  AST 22  ALT 6  ALKPHOS 35*  BILITOT 0.5  PROT 7.7  ALBUMIN 3.4*   Lab Results  Component Value Date   CALCIUM 8.9 11/15/2022   PHOS 3.4 11/15/2022    Plt: Lab Results  Component Value Date   PLT 104 (L) 11/15/2022     COVID-19 Labs  No results for input(s): "DDIMER", "FERRITIN", "LDH", "CRP" in the last 72 hours.  Lab Results  Component Value Date   [place] NEGATIVE 07/22/2019        Recent Labs  Lab 11/15/22 1858  WBC 4.3  NEUTROABS 2.0  HGB 13.2  HCT 40.4  MCV 90.2  PLT 104*    HG/HCT  stable,       Component Value Date/Time   HGB 13.2 11/15/2022 1858   HCT 40.4 11/15/2022 1858   MCV 90.2 11/15/2022 1858     Cardiac Panel (last 3 results) No results for input(s): "CKTOTAL", "CKMB", "TROPONINI", "RELINDX" in the last 72 hours.    Cultures:    Component Value Date/Time   SDES OTHER 10/31/2022 [place] 10/31/2022 1423   CULT  10/31/2022 1423    RARE STAPHYLOCOCCUS CAPITIS NO ANAEROBES ISOLATED Performed at [facility], [place] [address], [place], [place] [address]    REPTSTATUS 11/05/2022 FINAL 10/31/2022 1423     Radiological Exams on Admission: UE VENOUS DUPLEX (7am - 7pm)  Result Date: 11/15/2022 UPPER VENOUS STUDY  Patient Name:  Evan Morgan  Date of Exam:   11/15/2022 Medical Rec #: [id number]     Accession #:    [id number] Date of Birth: [birth year]     Patient Gender: M Patient Age:   57  years Exam Location:  [facility] Procedure:      VAS [place] UPPER EXTREMITY VENOUS DUPLEX Referring Phys: Theodis Blaze --------------------------------------------------------------------------------  Indications: Pain Other Indications: Right brachial PICC line. Risk Factors: None identified. Limitations: Poor ultrasound/tissue interface and patient positioning. Comparison Study: No prior studies. Performing Technologist: Oliver Hum RVT  Examination Guidelines: A complete evaluation includes B-mode imaging, spectral Doppler, color Doppler, and power Doppler as needed of all accessible portions of each vessel. Bilateral testing is considered an integral part of a complete examination. Limited examinations for reoccurring indications may be performed as noted.  Right Findings: +----------+------------+---------+-----------+----------+-------+ RIGHT     CompressiblePhasicitySpontaneousPropertiesSummary +----------+------------+---------+-----------+----------+-------+ IJV           None       No        No                Acute  +----------+------------+---------+-----------+----------+-------+ Subclavian    None       No        No                Acute  +----------+------------+---------+-----------+----------+-------+ Axillary      None       No        No  Acute  +----------+------------+---------+-----------+----------+-------+ Brachial      None       No        No                Acute  +----------+------------+---------+-----------+----------+-------+ Radial        Full                                          +----------+------------+---------+-----------+----------+-------+ Ulnar         Full                                          +----------+------------+---------+-----------+----------+-------+ Cephalic      Full                                          +----------+------------+---------+-----------+----------+-------+ Basilic        Full                                          +----------+------------+---------+-----------+----------+-------+  Left Findings: +----------+------------+---------+-----------+----------+-------+ LEFT      CompressiblePhasicitySpontaneousPropertiesSummary +----------+------------+---------+-----------+----------+-------+ Subclavian    Full       Yes       Yes                      +----------+------------+---------+-----------+----------+-------+  Summary:  Right: No evidence of superficial vein thrombosis in the upper extremity. Findings consistent with acute deep vein thrombosis involving the right internal jugular vein, right subclavian vein, right axillary vein and right brachial veins.  Left: No evidence of thrombosis in the subclavian.  *See table(s) above for measurements and observations.     Preliminary    _______________________________________________________________________________________________________ Latest  Blood pressure 121/76, pulse 61, temperature 98.3 F (36.8 C), temperature source Oral, resp. rate 18, height 5' 11"$  (1.803 m), weight 119.3 kg, SpO2 96 %.   Vitals  labs and radiology finding personally reviewed  Review of Systems:    Pertinent positives include: right arm swelling  Constitutional:  No weight loss, night sweats, Fevers, chills, fatigue, weight loss  HEENT:  No headaches, Difficulty swallowing,Tooth/dental problems,Sore throat,  No sneezing, itching, ear ache, nasal congestion, post nasal drip,  Cardio-vascular:  No chest pain, Orthopnea, PND, anasarca, dizziness, palpitations.no Bilateral lower extremity swelling  GI:  No heartburn, indigestion, abdominal pain, nausea, vomiting, diarrhea, change in bowel habits, loss of appetite, melena, blood in stool, hematemesis Resp:  no shortness of breath at rest. No dyspnea on exertion, No excess mucus, no productive cough, No non-productive cough, No coughing up of blood.No change in color of  mucus.No wheezing. Skin:  no rash or lesions. No jaundice GU:  no dysuria, change in color of urine, no urgency or frequency. No straining to urinate.  No flank pain.  Musculoskeletal:  No joint pain or no joint swelling. No decreased range of motion. No back pain.  Psych:  No change in mood or affect. No depression or anxiety. No memory loss.  Neuro: no localizing neurological complaints, no tingling, no weakness, no double vision, no gait abnormality, no slurred speech,  no confusion  All systems reviewed and apart from Evan Morgan all are negative _______________________________________________________________________________________________ Past Medical History:   Past Medical History:  Diagnosis Date   Arthritis    knees   Asthma    As a child   History of hepatitis C    Hypertension       Past Surgical History:  Procedure Laterality Date   ALLOGRAFT APPLICATION Left [id number]   Procedure: ALLOGRAFT APPLICATION;  Surgeon: Phylliss Bob, MD;  Location: [facility];  Service: Orthopedics;  Laterality: Left;   ANTERIOR LAT LUMBAR FUSION  07/23/2019   Procedure: LUMBAR TWO-THREE LEFT LATERAL INTERBODY FUSION WITH INSTRUMENTATION AND ALLOGRAFT;  Surgeon: Phylliss Bob, MD;  Location: [facility];  Service: Orthopedics;;   BACK SURGERY     x3   TRANSFORAMINAL LUMBAR INTERBODY FUSION (TLIF) WITH PEDICLE SCREW FIXATION 1 LEVEL Left 04/13/2022   Procedure: HARDWARE REMOVAL  LEFT-SIDED LUMBAR 4 - LUMBAR 5 TRANSFORAMINAL LUMBAR INTERBODY FUSION AND DECOMPRESSION WITH INSTRUMENTATION;  Surgeon: Phylliss Bob, MD;  Location: [facility];  Service: Orthopedics;  Laterality: Left;    Social History:  Ambulatory  wheelchair bound,       reports that he has never smoked. He has never used smokeless tobacco. He reports that he does not currently use alcohol. He reports current drug use. Frequency: 4.00 times per week. Drug: Marijuana.     Family History:   Family History  Problem Relation Age of Onset    Colon cancer Neg Hx    Esophageal cancer Neg Hx    Rectal cancer Neg Hx    Stomach cancer Neg Hx    ______________________________________________________________________________________________ Allergies: Allergies  Allergen Reactions   Amlodipine Other (See Comments)    Unknown reaction    Codeine Nausea And Vomiting   Lisinopril Cough     Prior to Admission medications   Medication Sig Start Date End Date Taking? Authorizing Provider  acetaminophen (TYLENOL) 500 MG tablet Take 1 tablet (500 mg total) by mouth every 6 (six) hours as needed for moderate pain. 11/03/22   Dwyane Dee, MD  atenolol (TENORMIN) 50 MG tablet Take 50 mg by mouth daily.     [provider]  ceFAZolin (ANCEF) IVPB Inject 2 g into the vein every 8 (eight) hours. Indication:  osteomyelitis First Dose: Yes Last Day of Therapy:  12/11/22- Pull PICC at completion of IV abx  Labs - Once weekly:  CBC/D and BMP, Labs - Every other week:  ESR and CRP Method of administration: IV Push Method of administration may be changed at the discretion of home infusion pharmacist based upon assessment of the patient and/or caregiver's ability to self-administer the medication ordered. 11/03/22 12/11/22  Dwyane Dee, MD  Cholecalciferol 50 MCG (2000 UT) TABS Take 2,000 Units by mouth daily.     [provider]  DULCOLAX 10 MG suppository Place 10 mg rectally daily as needed for constipation. 05/10/22   [provider]  finasteride (PROSCAR) 5 MG tablet Take 5 mg by mouth daily.    [provider]  gabapentin (NEURONTIN) 300 MG capsule Take 900 mg by mouth at bedtime.    [provider]  hydrALAZINE (APRESOLINE) 10 MG tablet Take 10 mg by mouth daily.    [provider]  hydrochlorothiazide (HYDRODIURIL) 25 MG tablet Take 25 mg by mouth daily.     [provider]  magnesium hydroxide (MILK OF MAGNESIA) 400 MG/5ML suspension Take 30 mLs by mouth daily as needed for mild  constipation.  [provider]  methocarbamol (ROBAXIN) 500 MG tablet Take 1-2 tablets (500-1,000 mg total) by mouth every 6 (six) hours as needed for muscle spasms. 05/24/22   McKenzie, Lennie Muckle, PA-C  nystatin-triamcinolone ointment (MYCOLOG) Apply 1 Application topically daily as needed for dry skin. 05/10/22   [provider]  ondansetron (ZOFRAN) 4 MG tablet Take 1 tablet (4 mg total) by mouth every 6 (six) hours as needed for nausea. 05/19/22   McKenzie, Lennie Muckle, PA-C  oxyCODONE-acetaminophen (PERCOCET/ROXICET) 5-325 MG tablet Take 1 tablet by mouth every 6 (six) hours as needed for moderate pain or severe pain. 11/03/22   Dwyane Dee, MD  SENNA LAXATIVE 8.6 MG tablet Take 1 tablet by mouth 2 (two) times daily. Hold for loose stools. 05/10/22   [provider]  tamsulosin (FLOMAX) 0.4 MG CAPS capsule Take 0.8 mg by mouth daily.    [provider]    ___________________________________________________________________________________________________ Physical Exam:    11/15/2022    8:30 PM 11/15/2022    7:45 PM 11/15/2022    7:30 PM  Vitals with BMI  Systolic [id number]  Diastolic 76 63 66  Pulse 61 65 63     1. General:  in No  Acute distress    Chronically ill   -appearing 2. Psychological: Alert and   Oriented 3. Head/ENT:    Dry Mucous Membranes                          Head Non traumatic, neck supple                           Poor Dentition 4. SKIN:  decreased Skin turgor,  Skin clean Dry and intact no rash 5. Heart: Regular rate and rhythm no  Murmur, no Rub or gallop 6. Lungs , no wheezes or crackles   7. Abdomen: Soft,  non-tender, Non distended   obese  bowel sounds present 8. Lower extremities: no clubbing, cyanosis, no  edema Upper extremities right upper extremity with PICC line in place with significant swelling 9. Neurologically Grossly intact, moving all 4 extremities equally  10. MSK: Normal range of motion    Chart has been  reviewed  ______________________________________________________________________________________________  Assessment/Plan  72 y.o. male with medical history significant of  HTN asthma, diskitis intermittent asthma, hypertension urinary incontinence history of hepatitis C  Admitted for   Acute deep vein thrombosis (DVT) of right upper extremity, unspecified vein ([place])    Present on Admission:  Arm DVT (deep venous thromboembolism), acute, right (HCC)  Discitis of lumbar region  BPH (benign prostatic hyperplasia)  HTN (hypertension)  Asthma, chronic  Hypokalemia     Discitis of lumbar region Continue Ancef every 8 hours Vascular surgery states okay to continue with PICC line  Arm DVT (deep venous thromboembolism), acute, right ([place]) In the setting of PICC line.  Vascular surgery recommended IV heparin.  May need to be on anticoagulation thereafter.  Given significant clot burden will consult IR to see if any intervention would be needed  BPH (benign prostatic hyperplasia) Chronic stable continue Flomax and Proscar  HTN (hypertension) Continue atenolol 50 mg p.o. daily and hydralazine at 10 mg p.o. daily  Asthma, chronic Chronic stable noncontributory continue albuterol as needed  Hypokalemia - will replace and repeat in AM,  check magnesium level and replace as needed Replace both and recheck in a.m.    Other plan as per  orders.  DVT prophylaxis:  heparin    Code Status:    Code Status: Prior FULL CODE   as per patient   I had personally discussed CODE STATUS with patient     Family Communication:   Family not at  Bedside    Disposition Plan:                              Back to current facility when stable                              Following barriers for discharge:                            Electrolytes corrected                                                           Pain controlled with PO medications                                                Will need consultants to evaluate patient prior to discharge                     Transition of care consulted                    Consults called: IR consult    Admission status:  ED Disposition     ED Disposition  Admit   Condition  --   [place]: Wayne [[id number]]  Level of Care: Telemetry [5]  Admit to tele based on following criteria: Other see comments  Comments: dvt  May place patient in observation at [facility] or [facility] [place] if equivalent level of care is available:: No  Covid Evaluation: Asymptomatic - no recent exposure (last 10 days) testing not required  Diagnosis: Arm DVT (deep venous thromboembolism), acute, right [id number]  Admitting Physician: Toy Baker [[id number]]  Attending Physician: Toy Baker [[id number]]           Obs   Need for IV  heparin     Level of care     tele  For 12H        Evan Morgan [place] 11/15/2022, 11:20 PM    [facility]     after 2 AM please page floor coverage PA If 7AM-7PM, please contact the day team taking care of the patient using [email or web address]   Patient was evaluated in the context of the global COVID-19 pandemic, which necessitated consideration that the patient might be at risk for infection with the SARS-CoV-2 virus that causes COVID-19. Institutional protocols and algorithms that pertain to the evaluation of patients at risk for COVID-19 are in a state of rapid change based on information released by regulatory bodies including the CDC and federal and state organizations. These policies and algorithms were followed during the patient's care.

## 2022-11-15 NOTE — ED Triage Notes (Signed)
BIB EMS from [facility] for right arm pain where PICC line is. Pt had scan done and it revealed a blockage somewhere in the PICC line. Pt has swelling to arm

## 2022-11-15 NOTE — ED Notes (Signed)
ED TO INPATIENT HANDOFF REPORT  ED Nurse Name and Phone #: Renette Butters Name/Age/Gender Evan Morgan 72 y.o. male Room/Bed: WA02/WA02  Code Status   Code Status: Prior  Home/SNF/Other Skilled nursing facility Patient oriented to: self, place, time, and situation Is this baseline? Yes   Triage Complete: Triage complete  Chief Complaint Arm DVT (deep venous thromboembolism), acute, right ([place]) [I82.621]  Triage Note BIB EMS from [facility] for right arm pain where PICC line is. Pt had scan done and it revealed a blockage somewhere in the PICC line. Pt has swelling to arm   Allergies Allergies  Allergen Reactions   Amlodipine Other (See Comments)    Unknown reaction    Codeine Nausea And Vomiting   Lisinopril Cough    Level of Care/Admitting Diagnosis ED Disposition     ED Disposition  Admit   Condition  --   Comment  Hospital Area: [place] [[id number]]  Level of Care: Telemetry [5]  Admit to tele based on following criteria: Other see comments  Comments: dvt  May place patient in observation at [facility] or [place] if equivalent level of care is available:: No  Covid Evaluation: Asymptomatic - no recent exposure (last 10 days) testing not required  Diagnosis: Arm DVT (deep venous thromboembolism), acute, right [id number]  Admitting Physician: Toy Baker [[id number]]  Attending Physician: Toy Baker [[id number]]          B Medical/Surgery History Past Medical History:  Diagnosis Date   Arthritis    knees   Asthma    As a child   History of hepatitis C    Hypertension    Past Surgical History:  Procedure Laterality Date   ALLOGRAFT APPLICATION Left [id number]   Procedure: ALLOGRAFT APPLICATION;  Surgeon: Phylliss Bob, MD;  Location: [place];  Service: Orthopedics;  Laterality: Left;   ANTERIOR LAT LUMBAR FUSION  07/23/2019   Procedure: LUMBAR TWO-THREE LEFT LATERAL INTERBODY FUSION WITH INSTRUMENTATION AND ALLOGRAFT;   Surgeon: Phylliss Bob, MD;  Location: [place];  Service: Orthopedics;;   BACK SURGERY     x3   TRANSFORAMINAL LUMBAR INTERBODY FUSION (TLIF) WITH PEDICLE SCREW FIXATION 1 LEVEL Left 04/13/2022   Procedure: HARDWARE REMOVAL  LEFT-SIDED LUMBAR 4 - LUMBAR 5 TRANSFORAMINAL LUMBAR INTERBODY FUSION AND DECOMPRESSION WITH INSTRUMENTATION;  Surgeon: Phylliss Bob, MD;  Location: [place];  Service: Orthopedics;  Laterality: Left;     A IV Location/Drains/Wounds Patient Lines/Drains/Airways Status     Active Line/Drains/Airways     Name Placement date Placement time Site Days   Peripheral IV 11/15/22 20 G Left;Posterior Forearm 11/15/22  2042  Forearm  less than 1   PICC Single Lumen 11/01/22 Right Brachial 43 cm 1 cm 11/01/22  1840  Brachial  14   External Urinary Catheter 10/31/22  2030  --  15   Wound / Incision (Open or Dehisced) 10/31/22 Puncture Lumbar Right 10/31/22  1331  Lumbar  15   Wound / Incision (Open or Dehisced) 11/02/22 Skin tear Buttocks Right 1/2 inch small skin 11/02/22  2056  Buttocks  13            Intake/Output Last 24 hours No intake or output data in the 24 hours ending 11/15/22 2143  Labs/Imaging Results for orders placed or performed during the hospital encounter of 11/15/22 (from the past 48 hour(s))  Basic metabolic panel     Status: Abnormal   Collection Time: 11/15/22  6:58 PM  Result Value Ref  Range   Sodium 135 135 - 145 mmol/L   Potassium 3.4 (L) 3.5 - 5.1 mmol/L   Chloride 101 98 - 111 mmol/L   CO2 28 22 - 32 mmol/L   Glucose, Bld 100 (H) 70 - 99 mg/dL    Comment: Glucose reference range applies only to samples taken after fasting for at least 8 hours.   BUN 12 8 - 23 mg/dL   Creatinine, Ser 0.81 0.61 - 1.24 mg/dL   Calcium 8.9 8.9 - 10.3 mg/dL   GFR, Estimated >60 >60 mL/min    Comment: (NOTE) Calculated using the CKD-EPI Creatinine Equation (2021)    Anion gap 6 5 - 15    Comment: Performed at [facility], [place] [address] [place], [place] [address]  CBC with Differential     Status: Abnormal   Collection Time: 11/15/22  6:58 PM  Result Value Ref Range   WBC 4.3 4.0 - 10.5 K/uL   RBC 4.48 4.22 - 5.81 MIL/uL   Hemoglobin 13.2 13.0 - 17.0 g/dL   HCT 40.4 39.0 - 52.0 %   MCV 90.2 80.0 - 100.0 fL   MCH 29.5 26.0 - 34.0 pg   MCHC 32.7 30.0 - 36.0 g/dL   RDW 14.1 11.5 - 15.5 %   Platelets 104 (L) 150 - 400 K/uL    Comment: Immature Platelet Fraction may be clinically indicated, consider ordering this additional test [id number]    nRBC 0.0 0.0 - 0.2 %   Neutrophils Relative % 45 %   Neutro Abs 2.0 1.7 - 7.7 K/uL   Lymphocytes Relative 37 %   Lymphs Abs 1.6 0.7 - 4.0 K/uL   Monocytes Relative 13 %   Monocytes Absolute 0.6 0.1 - 1.0 K/uL   Eosinophils Relative 3 %   Eosinophils Absolute 0.1 0.0 - 0.5 K/uL   Basophils Relative 1 %   Basophils Absolute 0.0 0.0 - 0.1 K/uL   Immature Granulocytes 1 %   Abs Immature Granulocytes 0.02 0.00 - 0.07 K/uL    Comment: Performed at [facility], [place] [address], [place], [place] [address]  Lactic acid, plasma     Status: None   Collection Time: 11/15/22  6:58 PM  Result Value Ref Range   Lactic Acid, Venous 1.1 0.5 - 1.9 mmol/L    Comment: Performed at [facility], [place] [address] [place], [place] [address]   UE VENOUS DUPLEX (7am - 7pm)  Result Date: 11/15/2022 UPPER VENOUS STUDY  Patient Name:  Evan Morgan  Date of Exam:   11/15/2022 Medical Rec #: [id number]     Accession #:    [id number] Date of Birth: 10-31-1950     Patient Gender: M Patient Age:   81 years Exam Location:  [facility] Procedure:      VAS [place] UPPER EXTREMITY VENOUS DUPLEX Referring Phys: Theodis Blaze --------------------------------------------------------------------------------  Indications: Pain Other Indications: Right brachial PICC line. Risk Factors: None identified. Limitations: Poor ultrasound/tissue interface and patient positioning. Comparison  Study: No prior studies. Performing Technologist: Oliver Hum RVT  Examination Guidelines: A complete evaluation includes B-mode imaging, spectral Doppler, color Doppler, and power Doppler as needed of all accessible portions of each vessel. Bilateral testing is considered an integral part of a complete examination. Limited examinations for reoccurring indications may be performed as noted.  Right Findings: +----------+------------+---------+-----------+----------+-------+ RIGHT     CompressiblePhasicitySpontaneousPropertiesSummary +----------+------------+---------+-----------+----------+-------+ IJV           None  No        No                Acute  +----------+------------+---------+-----------+----------+-------+ Subclavian    None       No        No                Acute  +----------+------------+---------+-----------+----------+-------+ Axillary      None       No        No                Acute  +----------+------------+---------+-----------+----------+-------+ Brachial      None       No        No                Acute  +----------+------------+---------+-----------+----------+-------+ Radial        Full                                          +----------+------------+---------+-----------+----------+-------+ Ulnar         Full                                          +----------+------------+---------+-----------+----------+-------+ Cephalic      Full                                          +----------+------------+---------+-----------+----------+-------+ Basilic       Full                                          +----------+------------+---------+-----------+----------+-------+  Left Findings: +----------+------------+---------+-----------+----------+-------+ LEFT      CompressiblePhasicitySpontaneousPropertiesSummary +----------+------------+---------+-----------+----------+-------+ Subclavian    Full       Yes       Yes                       +----------+------------+---------+-----------+----------+-------+  Summary:  Right: No evidence of superficial vein thrombosis in the upper extremity. Findings consistent with acute deep vein thrombosis involving the right internal jugular vein, right subclavian vein, right axillary vein and right brachial veins.  Left: No evidence of thrombosis in the subclavian.  *See table(s) above for measurements and observations.     Preliminary     Pending Labs Unresulted Labs (From admission, onward)     Start     Ordered   11/16/22 0500  CBC  Daily,   R      11/15/22 1945   11/16/22 0500  Heparin level (unfractionated)  Once-Timed,   URGENT        11/15/22 2046   11/16/22 0500  Prealbumin  Tomorrow morning,   R        11/15/22 2104   11/15/22 2105  Hepatic function panel  Add-on,   AD       Question:  Release to patient  Answer:  Immediate   11/15/22 2104   11/15/22 2104  CK  Add-on,   AD        11/15/22 2104   11/15/22 2104  Magnesium  Add-on,   AD        11/15/22 2104   11/15/22 2104  Phosphorus  Add-on,   AD        11/15/22 2104   11/15/22 2104  TSH  Add-on,   AD        11/15/22 2104   11/15/22 1931  APTT  ONCE - STAT,   STAT        11/15/22 1930   11/15/22 1931  Protime-INR  ONCE - STAT,   STAT        11/15/22 1930   11/15/22 1847  Culture, blood (routine x 2)  BLOOD CULTURE X 2,   R     Question:  Patient immune status  Answer:  Normal   11/15/22 1846            Vitals/Pain Today's Vitals   11/15/22 1945 11/15/22 2030 11/15/22 2045 11/15/22 2100  BP: 124/63 121/76 129/71 132/67  Pulse: 65 61 64 72  Resp: 18 18 18 20  $ Temp:      TempSrc:      SpO2: 98% 96% 94% 100%  Weight:      Height:      PainSc:        Isolation Precautions No active isolations  Medications Medications  heparin ADULT infusion 100 units/mL (25000 units/272m) (1,700 Units/hr Intravenous New Bag/Given 11/15/22 2044)  heparin bolus via infusion 4,000 Units (4,000 Units  Intravenous Bolus from Bag 11/15/22 2044)    Mobility non-ambulatory     Focused Assessments     R Recommendations: See Admitting Provider Note  Report given to:   Additional Notes:

## 2022-11-15 NOTE — ED Provider Notes (Signed)
[place] EMERGENCY DEPARTMENT AT [facility] Provider Note   CSN: [id number] Arrival date & time: 11/15/22  1741     History  Chief Complaint  Patient presents with   Arm Swelling    Right arm (PICC line)    Evan Morgan is a 72 y.o. male. With past medical history of hypertension, asthma, arthritis, and recent vertebral discitis who presents to the emergency department with right arm swelling.   States that he was discharged recently to [facility] facility for IV antibiotics. He had been having back pain and found to have infection. He states that over the past 3 days he has had worsening right arm swelling. He states that facility did ultrasound which was concerned for clot. He denies numbness or tingling to the arm. Denies paleness or coolness or pain. He denies chest pain or shortness of breath. Denies fevers.   HPI     Home Medications Prior to Admission medications   Medication Sig Start Date End Date Taking? Authorizing Provider  acetaminophen (TYLENOL) 500 MG tablet Take 1 tablet (500 mg total) by mouth every 6 (six) hours as needed for moderate pain. 11/03/22   Dwyane Dee, MD  atenolol (TENORMIN) 50 MG tablet Take 50 mg by mouth daily.     [provider]  ceFAZolin (ANCEF) IVPB Inject 2 g into the vein every 8 (eight) hours. Indication:  osteomyelitis First Dose: Yes Last Day of Therapy:  12/11/22- Pull PICC at completion of IV abx  Labs - Once weekly:  CBC/D and BMP, Labs - Every other week:  ESR and CRP Method of administration: IV Push Method of administration may be changed at the discretion of home infusion pharmacist based upon assessment of the patient and/or caregiver's ability to self-administer the medication ordered. 11/03/22 12/11/22  Dwyane Dee, MD  Cholecalciferol 50 MCG (2000 UT) TABS Take 2,000 Units by mouth daily.     [provider]  DULCOLAX 10 MG suppository Place 10 mg rectally daily as needed for constipation.  05/10/22   [provider]  finasteride (PROSCAR) 5 MG tablet Take 5 mg by mouth daily.    [provider]  gabapentin (NEURONTIN) 300 MG capsule Take 900 mg by mouth at bedtime.    [provider]  hydrALAZINE (APRESOLINE) 10 MG tablet Take 10 mg by mouth daily.    [provider]  hydrochlorothiazide (HYDRODIURIL) 25 MG tablet Take 25 mg by mouth daily.     [provider]  magnesium hydroxide (MILK OF MAGNESIA) 400 MG/5ML suspension Take 30 mLs by mouth daily as needed for mild constipation.    [provider]  methocarbamol (ROBAXIN) 500 MG tablet Take 1-2 tablets (500-1,000 mg total) by mouth every 6 (six) hours as needed for muscle spasms. 05/24/22   McKenzie, Lennie Muckle, PA-C  nystatin-triamcinolone ointment (MYCOLOG) Apply 1 Application topically daily as needed for dry skin. 05/10/22   [provider]  ondansetron (ZOFRAN) 4 MG tablet Take 1 tablet (4 mg total) by mouth every 6 (six) hours as needed for nausea. 05/19/22   McKenzie, Lennie Muckle, PA-C  oxyCODONE-acetaminophen (PERCOCET/ROXICET) 5-325 MG tablet Take 1 tablet by mouth every 6 (six) hours as needed for moderate pain or severe pain. 11/03/22   Dwyane Dee, MD  SENNA LAXATIVE 8.6 MG tablet Take 1 tablet by mouth 2 (two) times daily. Hold for loose stools. 05/10/22   [provider]  tamsulosin (FLOMAX) 0.4 MG CAPS capsule Take 0.8 mg by mouth daily.  [provider]      Allergies    Amlodipine, Codeine, and Lisinopril    Review of Systems   Review of Systems  All other systems reviewed and are negative.   Physical Exam Updated Vital Signs BP 121/76   Pulse 61   Temp 98.3 F (36.8 C) (Oral)   Resp 18   Ht 5' 11"$  (1.803 m)   Wt 119.3 kg   SpO2 96%   BMI 36.68 kg/m  Physical Exam Vitals and nursing note reviewed.  Constitutional:      General: He is not in acute distress.    Appearance: Normal appearance. He is obese.  HENT:     Head:  Normocephalic.  Eyes:     General: No scleral icterus.    Extraocular Movements: Extraocular movements intact.  Cardiovascular:     Rate and Rhythm: Normal rate and regular rhythm.     Pulses: Normal pulses.  Pulmonary:     Effort: Pulmonary effort is normal. No respiratory distress.     Breath sounds: Normal breath sounds.  Abdominal:     General: Bowel sounds are normal.     Palpations: Abdomen is soft.  Musculoskeletal:        General: Swelling present.     Comments: Uniform swelling to RUE. Radial pulse 1+. Arm is warm and dry with good cap refill. Sensation intact. Compartments soft. No cellulitis or fluctuance about the PICC line.   Skin:    General: Skin is warm and dry.     Capillary Refill: Capillary refill takes less than 2 seconds.     Findings: No erythema.  Neurological:     General: No focal deficit present.     Mental Status: He is alert and oriented to person, place, and time. Mental status is at baseline.  Psychiatric:        Mood and Affect: Mood normal.        Behavior: Behavior normal.        Thought Content: Thought content normal.        Judgment: Judgment normal.     ED Results / Procedures / Treatments   Labs (all labs ordered are listed, but only abnormal results are displayed) Labs Reviewed  BASIC METABOLIC PANEL - Abnormal; Notable for the following components:      Result Value   Potassium 3.4 (*)    Glucose, Bld 100 (*)    All other components within normal limits  CBC WITH DIFFERENTIAL/PLATELET - Abnormal; Notable for the following components:   Platelets 104 (*)    All other components within normal limits  CULTURE, BLOOD (ROUTINE X 2)  CULTURE, BLOOD (ROUTINE X 2)  LACTIC ACID, PLASMA  APTT  PROTIME-INR  CBC  HEPARIN LEVEL (UNFRACTIONATED)  CK  MAGNESIUM  PHOSPHORUS  TSH  PREALBUMIN  HEPATIC FUNCTION PANEL    EKG None  Radiology UE VENOUS DUPLEX (7am - 7pm)  Result Date: 11/15/2022 UPPER VENOUS STUDY  Patient Name:  Evan Morgan  Date of Exam:   11/15/2022 Medical Rec #: [id number]     Accession #:    [id number] Date of Birth: July 15, 1951     Patient Gender: M Patient Age:   41 years Exam Location:  [facility] Procedure:      VAS [place] UPPER EXTREMITY VENOUS DUPLEX Referring Phys: Theodis Blaze --------------------------------------------------------------------------------  Indications: Pain Other Indications: Right brachial PICC line. Risk Factors: None identified. Limitations: Poor ultrasound/tissue interface and patient positioning. Comparison Study: No prior  studies. Performing Technologist: Oliver Hum RVT  Examination Guidelines: A complete evaluation includes B-mode imaging, spectral Doppler, color Doppler, and power Doppler as needed of all accessible portions of each vessel. Bilateral testing is considered an integral part of a complete examination. Limited examinations for reoccurring indications may be performed as noted.  Right Findings: +----------+------------+---------+-----------+----------+-------+ RIGHT     CompressiblePhasicitySpontaneousPropertiesSummary +----------+------------+---------+-----------+----------+-------+ IJV           None       No        No                Acute  +----------+------------+---------+-----------+----------+-------+ Subclavian    None       No        No                Acute  +----------+------------+---------+-----------+----------+-------+ Axillary      None       No        No                Acute  +----------+------------+---------+-----------+----------+-------+ Brachial      None       No        No                Acute  +----------+------------+---------+-----------+----------+-------+ Radial        Full                                          +----------+------------+---------+-----------+----------+-------+ Ulnar         Full                                           +----------+------------+---------+-----------+----------+-------+ Cephalic      Full                                          +----------+------------+---------+-----------+----------+-------+ Basilic       Full                                          +----------+------------+---------+-----------+----------+-------+  Left Findings: +----------+------------+---------+-----------+----------+-------+ LEFT      CompressiblePhasicitySpontaneousPropertiesSummary +----------+------------+---------+-----------+----------+-------+ Subclavian    Full       Yes       Yes                      +----------+------------+---------+-----------+----------+-------+  Summary:  Right: No evidence of superficial vein thrombosis in the upper extremity. Findings consistent with acute deep vein thrombosis involving the right internal jugular vein, right subclavian vein, right axillary vein and right brachial veins.  Left: No evidence of thrombosis in the subclavian.  *See table(s) above for measurements and observations.     Preliminary     Procedures Procedures   Medications Ordered in ED Medications  heparin ADULT infusion 100 units/mL (25000 units/272m) (1,700 Units/hr Intravenous New Bag/Given 11/15/22 2044)  heparin bolus via infusion 4,000 Units (4,000 Units Intravenous Bolus from Bag 11/15/22 2044)    ED Course/ Medical Decision Making/ A&P Clinical Course as of 11/15/22 2108  Wed Nov 15, 2022  1945 Per Dr. Carlis Abbott, vascular surgery, no intervention for PICC at this time. Anticoagulate on heparin and can continue using PICC.  [LA]    Clinical Course User Index [LA] Mickie Hillier, PA-C    Medical Decision Making Amount and/or Complexity of Data Reviewed Labs: ordered.  Risk Prescription drug management. Decision regarding hospitalization.  Initial Impression and Ddx 72 year old male who presents with RUE swelling from PICC line.  Patient PMH that increases complexity of ED  encounter:  htn, discitis, asthma, arthritis  Differential: line infection, DVT, extravasation, cellulitis, abscess, etc.   Interpretation of Diagnostics I independent reviewed and interpreted the labs as followed: bcx pending, lactic negative, cbc pending  - I independently visualized the following imaging with scope of interpretation limited to determining acute life threatening conditions related to emergency care: UE DVT, which revealed right IJ, axillary, brachial, subclavian DVT  Patient Reassessment and Ultimate Disposition/Management 72 year old man, overall well appearing, non-septic in appearance.  RUE swelling. Neurovascularly intact. No concern for ischemic limb at this time.  Obtaining labs including blood cultures to rule out infection of line  DVT study.  Reassessment: DVT + for right IJ, subclavian, axillary and brachial veins.  Consulted and spoke with Dr. Carlis Abbott, vascular surgery who does not recommend any surgical intervention. Can use heparin gtt and continue using line. -heparin gtt already ordered.   Consulted and spoke with [place], hospitalist who agrees to admit patient. May need IR consultation in AM or PICC team to re-evaluate placement, patency.   Patient management required discussion with the following services or consulting groups:  Hospitalist Service, vascular surgery   Complexity of Problems Addressed Acute illness or injury that poses threat of life of bodily function  Additional Data Reviewed and Analyzed Further history obtained from: Past medical history and medications listed in the EMR, Prior ED visit notes, Recent discharge summary, and Care Everywhere  Patient Encounter Risk Assessment SDOH impact on management, Use of parenteral controlled substances, and Consideration of hospitalization  Final Clinical Impression(s) / ED Diagnoses Final diagnoses:  Acute deep vein thrombosis (DVT) of right upper extremity, unspecified vein [facility])    Rx /  DC Orders ED Discharge Orders     None         Mickie Hillier, PA-C 11/15/22 2109    Margette Fast, MD 11/15/22 2224

## 2022-11-15 NOTE — Subjective & Objective (Addendum)
Pt has R arm PICC was dc to [facility] for ancef for discitis Patient has known history of severe spinal canal stenosis status post multiple surgeries last one was done in August 2023 has been wheelchair-bound since then they have chronic urinary incontinence in January presented for worsening back pain MRI done showed discitis versus osteomyelitis of L4-5 He underwent IR evaluation and cultures grew Staph capitis he was changed to Ancef PICC line was placed on 01 November 2022 by PICC team and he was discharged to nursing home on Ancef every 8 hours until December 11, 2022

## 2022-11-15 NOTE — Progress Notes (Signed)
Right upper extremity venous duplex has been completed. Preliminary results can be found in CV Proc through chart review.  Results were given to Theodis Blaze PA.  11/15/22 7:18 PM Carlos Levering RVT

## 2022-11-15 NOTE — Assessment & Plan Note (Signed)
-   will replace and repeat in AM,  check magnesium level and replace as needed Replace both and recheck in a.m.

## 2022-11-15 NOTE — Assessment & Plan Note (Signed)
Continue atenolol 50 mg p.o. daily and hydralazine at 10 mg p.o. daily

## 2022-11-15 NOTE — Progress Notes (Signed)
ANTICOAGULATION CONSULT NOTE - Initial Consult  Pharmacy Consult for Heparin Indication: DVT  Allergies  Allergen Reactions   Amlodipine Other (See Comments)    Unknown reaction    Codeine Nausea And Vomiting   Lisinopril Cough    Patient Measurements: Height: 5' 11"$  (180.3 cm) Weight: 119.3 kg (263 lb) IBW/kg (Calculated) : 75.3 Heparin Dosing Weight: 101 kg  Vital Signs: Temp: 98.3 F (36.8 C) (02/14 1748) Temp Source: Oral (02/14 1748) BP: 123/87 (02/14 1900) Pulse Rate: 68 (02/14 1900)  Labs: Recent Labs    11/15/22 1858  CREATININE 0.81    Estimated Creatinine Clearance: 111.5 mL/min (by C-G formula based on SCr of 0.81 mg/dL).   Medical History: Past Medical History:  Diagnosis Date   Arthritis    knees   Asthma    As a child   History of hepatitis C    Hypertension     Medications:  Scheduled:  Infusions:  PRN:   Assessment: 72 yo male presents with R arm swelling at PICC site.  Pharmacy consulted to dose IV heparin for +DVT.  Goal of Therapy:  Heparin level 0.3-0.7 units/ml Monitor platelets by anticoagulation protocol: Yes   Plan:  Give 4000 units bolus x 1 Start heparin infusion at 1700 units/hr Check anti-Xa level in 8 hours and daily while on heparin Continue to monitor H&H and platelets  Peggyann Juba, PharmD, BCPS Pharmacy: [phone] 11/15/2022,7:34 PM

## 2022-11-15 NOTE — Assessment & Plan Note (Signed)
In the setting of PICC line.  Vascular surgery recommended IV heparin.  May need to be on anticoagulation thereafter.  Given significant clot burden will consult IR to see if any intervention would be needed

## 2022-11-15 NOTE — Assessment & Plan Note (Signed)
Chronic stable continue Flomax and Proscar

## 2022-11-15 NOTE — Assessment & Plan Note (Signed)
Chronic stable noncontributory continue albuterol as needed

## 2022-11-15 NOTE — Progress Notes (Signed)
Pharmacy Antibiotic Note  Evan Morgan is a 72 y.o. male admitted on 11/15/2022 with  hardware associated osteomyelitis discitis .  Previous IR guided aspirate yielded Staphylococcus capitis sensitive to Ancef.  His is currently on IV Ancef for 6 weeks which he will complete on 12/11/22 with plans to transition to oral cefadroxil afterwards per ID.   Pharmacy has been consulted for Ancef dosing/monitoring while inpatient. Renal function stable.   Plan: Continue Ancef 2gm IV q8h Monitor renal function  Height: 5' 11"$  (180.3 cm) Weight: 119.3 kg (263 lb) IBW/kg (Calculated) : 75.3  Temp (24hrs), Avg:98.2 F (36.8 C), Min:98 F (36.7 C), Max:98.3 F (36.8 C)  Recent Labs  Lab 11/15/22 1858  WBC 4.3  CREATININE 0.81  LATICACIDVEN 1.1    Estimated Creatinine Clearance: 111.5 mL/min (by C-G formula based on SCr of 0.81 mg/dL).    Allergies  Allergen Reactions   Amlodipine Other (See Comments)    Unknown reaction    Codeine Nausea And Vomiting   Lisinopril Cough    Thank you for allowing pharmacy to be a part of this patient's care.  Netta Cedars PharmD 11/15/2022 11:29 PM

## 2022-11-15 NOTE — Assessment & Plan Note (Signed)
Continue Ancef every 8 hours Vascular surgery states okay to continue with PICC line

## 2022-11-16 DIAGNOSIS — Z993 Dependence on wheelchair: Secondary | ICD-10-CM | POA: Diagnosis not present

## 2022-11-16 DIAGNOSIS — Z888 Allergy status to other drugs, medicaments and biological substances status: Secondary | ICD-10-CM | POA: Diagnosis not present

## 2022-11-16 DIAGNOSIS — M25511 Pain in right shoulder: Secondary | ICD-10-CM | POA: Diagnosis present

## 2022-11-16 DIAGNOSIS — Z981 Arthrodesis status: Secondary | ICD-10-CM | POA: Diagnosis not present

## 2022-11-16 DIAGNOSIS — I82B11 Acute embolism and thrombosis of right subclavian vein: Secondary | ICD-10-CM | POA: Diagnosis present

## 2022-11-16 DIAGNOSIS — M170 Bilateral primary osteoarthritis of knee: Secondary | ICD-10-CM | POA: Diagnosis present

## 2022-11-16 DIAGNOSIS — T82868A Thrombosis of vascular prosthetic devices, implants and grafts, initial encounter: Secondary | ICD-10-CM | POA: Diagnosis present

## 2022-11-16 DIAGNOSIS — I10 Essential (primary) hypertension: Secondary | ICD-10-CM | POA: Diagnosis present

## 2022-11-16 DIAGNOSIS — J4520 Mild intermittent asthma, uncomplicated: Secondary | ICD-10-CM | POA: Diagnosis present

## 2022-11-16 DIAGNOSIS — I82621 Acute embolism and thrombosis of deep veins of right upper extremity: Secondary | ICD-10-CM | POA: Diagnosis present

## 2022-11-16 DIAGNOSIS — M25512 Pain in left shoulder: Secondary | ICD-10-CM | POA: Diagnosis present

## 2022-11-16 DIAGNOSIS — I82A11 Acute embolism and thrombosis of right axillary vein: Secondary | ICD-10-CM | POA: Diagnosis present

## 2022-11-16 DIAGNOSIS — Z6836 Body mass index (BMI) 36.0-36.9, adult: Secondary | ICD-10-CM | POA: Diagnosis not present

## 2022-11-16 DIAGNOSIS — Z8619 Personal history of other infectious and parasitic diseases: Secondary | ICD-10-CM | POA: Diagnosis not present

## 2022-11-16 DIAGNOSIS — I6521 Occlusion and stenosis of right carotid artery: Secondary | ICD-10-CM | POA: Diagnosis not present

## 2022-11-16 DIAGNOSIS — M4646 Discitis, unspecified, lumbar region: Secondary | ICD-10-CM | POA: Diagnosis present

## 2022-11-16 DIAGNOSIS — Z885 Allergy status to narcotic agent status: Secondary | ICD-10-CM | POA: Diagnosis not present

## 2022-11-16 DIAGNOSIS — E876 Hypokalemia: Secondary | ICD-10-CM | POA: Diagnosis present

## 2022-11-16 DIAGNOSIS — E669 Obesity, unspecified: Secondary | ICD-10-CM | POA: Diagnosis present

## 2022-11-16 DIAGNOSIS — N400 Enlarged prostate without lower urinary tract symptoms: Secondary | ICD-10-CM | POA: Diagnosis present

## 2022-11-16 DIAGNOSIS — Y848 Other medical procedures as the cause of abnormal reaction of the patient, or of later complication, without mention of misadventure at the time of the procedure: Secondary | ICD-10-CM | POA: Diagnosis present

## 2022-11-16 DIAGNOSIS — D696 Thrombocytopenia, unspecified: Secondary | ICD-10-CM | POA: Diagnosis present

## 2022-11-16 DIAGNOSIS — R32 Unspecified urinary incontinence: Secondary | ICD-10-CM | POA: Diagnosis present

## 2022-11-16 DIAGNOSIS — I82C11 Acute embolism and thrombosis of right internal jugular vein: Secondary | ICD-10-CM | POA: Diagnosis present

## 2022-11-16 DIAGNOSIS — I82409 Acute embolism and thrombosis of unspecified deep veins of unspecified lower extremity: Secondary | ICD-10-CM | POA: Diagnosis present

## 2022-11-16 DIAGNOSIS — Z79899 Other long term (current) drug therapy: Secondary | ICD-10-CM | POA: Diagnosis not present

## 2022-11-16 LAB — CBC
HCT: 37.5 % — ABNORMAL LOW (ref 39.0–52.0)
Hemoglobin: 12.2 g/dL — ABNORMAL LOW (ref 13.0–17.0)
MCH: 29 pg (ref 26.0–34.0)
MCHC: 32.5 g/dL (ref 30.0–36.0)
MCV: 89.3 fL (ref 80.0–100.0)
Platelets: 104 10*3/uL — ABNORMAL LOW (ref 150–400)
RBC: 4.2 MIL/uL — ABNORMAL LOW (ref 4.22–5.81)
RDW: 14.2 % (ref 11.5–15.5)
WBC: 4.2 10*3/uL (ref 4.0–10.5)
nRBC: 0 % (ref 0.0–0.2)

## 2022-11-16 LAB — COMPREHENSIVE METABOLIC PANEL
ALT: 6 U/L (ref 0–44)
AST: 19 U/L (ref 15–41)
Albumin: 3.2 g/dL — ABNORMAL LOW (ref 3.5–5.0)
Alkaline Phosphatase: 32 U/L — ABNORMAL LOW (ref 38–126)
Anion gap: 6 (ref 5–15)
BUN: 11 mg/dL (ref 8–23)
CO2: 24 mmol/L (ref 22–32)
Calcium: 8.5 mg/dL — ABNORMAL LOW (ref 8.9–10.3)
Chloride: 102 mmol/L (ref 98–111)
Creatinine, Ser: 0.72 mg/dL (ref 0.61–1.24)
GFR, Estimated: >60 mL/min (ref >60)
Glucose, Bld: 97 mg/dL (ref 70–99)
Potassium: 4 mmol/L (ref 3.5–5.1)
Sodium: 132 mmol/L — ABNORMAL LOW (ref 135–145)
Total Bilirubin: 0.7 mg/dL (ref 0.3–1.2)
Total Protein: 7.1 g/dL (ref 6.5–8.1)

## 2022-11-16 LAB — MAGNESIUM: Magnesium: 1.9 mg/dL (ref 1.7–2.4)

## 2022-11-16 LAB — HEPARIN LEVEL (UNFRACTIONATED)
Heparin Unfractionated: >1.1 IU/mL — ABNORMAL HIGH (ref 0.30–0.70)
Heparin Unfractionated: 0.45 IU/mL (ref 0.30–0.70)
Heparin Unfractionated: 0.52 IU/mL (ref 0.30–0.70)

## 2022-11-16 LAB — PREALBUMIN: Prealbumin: 15 mg/dL — ABNORMAL LOW (ref 18–38)

## 2022-11-16 LAB — TSH: TSH: 1.579 u[IU]/mL (ref 0.350–4.500)

## 2022-11-16 LAB — PHOSPHORUS: Phosphorus: 3.4 mg/dL (ref 2.5–4.6)

## 2022-11-16 MED ORDER — SENNOSIDES-DOCUSATE SODIUM 8.6-50 MG PO TABS
1.0000 | ORAL_TABLET | Freq: Two times a day (BID) | ORAL | Status: DC
  Administered 2022-11-16 – 2022-11-22 (×10): 1 via ORAL
  Filled 2022-11-16 (×11): qty 1

## 2022-11-16 MED ORDER — POLYETHYLENE GLYCOL 3350 17 G PO PACK
17.0000 g | PACK | Freq: Every day | ORAL | Status: DC
  Administered 2022-11-16 – 2022-11-20 (×5): 17 g via ORAL
  Filled 2022-11-16 (×6): qty 1

## 2022-11-16 MED ORDER — CHLORHEXIDINE GLUCONATE CLOTH 2 % EX PADS
6.0000 | MEDICATED_PAD | Freq: Every day | CUTANEOUS | Status: DC
  Administered 2022-11-16 – 2022-11-22 (×7): 6 via TOPICAL

## 2022-11-16 NOTE — Progress Notes (Signed)
ANTICOAGULATION CONSULT NOTE - follow up  Pharmacy Consult for Heparin Indication: DVT  Allergies  Allergen Reactions   Amlodipine Other (See Comments)    Unknown reaction    Codeine Nausea And Vomiting   Lisinopril Cough    Patient Measurements: Height: 5' 11"$  (180.3 cm) Weight: 119.3 kg (263 lb) IBW/kg (Calculated) : 75.3 Heparin Dosing Weight: 101 kg  Vital Signs: Temp: 98.3 F (36.8 C) (02/15 0628) Temp Source: Oral (02/15 0243) BP: 128/84 (02/15 0628) Pulse Rate: 61 (02/15 0628)  Labs: Recent Labs    11/15/22 1858 11/15/22 2038 11/16/22 0451 11/16/22 0736  HGB 13.2  --  12.2*  --   HCT 40.4  --  37.5*  --   PLT 104*  --  104*  --   APTT  --  41*  --   --   LABPROT  --  14.6  --   --   INR  --  1.2  --   --   HEPARINUNFRC  --   --  >1.10* 0.45  CREATININE 0.81  --  0.72  --   CKTOTAL 75  --   --   --      Estimated Creatinine Clearance: 112.9 mL/min (by C-G formula based on SCr of 0.72 mg/dL). Assessment: 72 yo male presents with R arm swelling at PICC site.  Pharmacy consulted to dose IV heparin for +DVT.  US doppler showed acute deep vein thrombosis involving the right internal jugular vein, right subclavian vein, right axillary vein and right brachial veins.   11/16/2022 AM heparin level drawn at 0451 > 1.1- supra-therapeutic;  drawn from PICC with heparin infusing via PIV per RN Repeat heparin level drawn 0736 = 0.45 - therapeutic after 4000 unit bolus & drip at 1700 units/hr Hg 13.2> 12.2, PLT 104 - low/same as admission No bleeding reported  Goal of Therapy:  Heparin level 0.3-0.7 units/ml Monitor platelets by anticoagulation protocol: Yes   Plan:  Continue heparin drip at 1700 units/hr Confirmatory heparin level in 8 hrs Daily CBC & heparin level F/u to transition to [place], Pharm.D Use secure chat for questions 11/16/2022 8:54 AM

## 2022-11-16 NOTE — Progress Notes (Signed)
Nutrition Brief Note  RD consulted for nutritional assessment.   Pt was NPO this morning but now diet has been advanced. Pt states he is hungry and has had no trouble eating at home. Denies issues with swallowing or chewing. States recently his weight has increased from 250 lbs to current weight: 263 lbs.   Wt Readings from Last 15 Encounters:  11/15/22 119.3 kg  10/30/22 127 kg  04/13/22 131.5 kg  04/10/22 131.5 kg  08/16/20 131.5 kg  07/23/19 127 kg  09/12/18 131.5 kg  09/05/11 130.2 kg    Body mass index is 36.68 kg/m. Patient meets criteria for obesity based on current BMI.   Current diet order is heart healthy, diet just advanced. Pt reports he feels hungry and would like to eat.  Labs and medications reviewed.   No nutrition interventions warranted at this time. If nutrition issues arise, please consult RD.   Evan Bibles, MS, RD, LDN Inpatient Clinical Dietitian Contact information available via Amion

## 2022-11-16 NOTE — Progress Notes (Signed)
PT Cancellation Note  Patient Details Name: Evan Morgan MRN: [id number] DOB: [birth year]   Cancelled Treatment:    Reason Eval/Treat Not Completed: Patient not medically ready. Per therapy protocol for acute DVT - hold until after 24 hrs of Heparin; Heparin initiated on 2/14 at 2044.     Tori Kian Gamarra PT, DPT 11/16/22, 9:10 AM

## 2022-11-16 NOTE — Progress Notes (Signed)
OT Cancellation Note  Patient Details Name: CHADWYCK SCHUCHMANN MRN: [id number] DOB: 1950-12-13   Cancelled Treatment:    Reason Eval/Treat Not Completed: Other (comment). Per therapy protocol for new UE DVIT  - hold until after 24 hrs of Heparin and limit use of UE until in therapeutic range. Heparin initiated  on 2/14 at 2044.   Niomie Englert L Demitri Kucinski 11/16/2022, 8:17 AM

## 2022-11-16 NOTE — Progress Notes (Signed)
ANTICOAGULATION CONSULT NOTE - follow up  Pharmacy Consult for Heparin Indication: DVT  Allergies  Allergen Reactions   Amlodipine Other (See Comments)    Unknown reaction    Codeine Nausea And Vomiting   Lisinopril Cough    Patient Measurements: Height: 5' 11"$  (180.3 cm) Weight: 119.3 kg (263 lb) IBW/kg (Calculated) : 75.3 Heparin Dosing Weight: 101 kg  Vital Signs: Temp: 97.7 F (36.5 C) (02/15 1138) Temp Source: Oral (02/15 1138) BP: 132/66 (02/15 1138) Pulse Rate: 62 (02/15 1138)  Labs: Recent Labs    11/15/22 1858 11/15/22 2038 11/16/22 0451 11/16/22 0736 11/16/22 1524  HGB 13.2  --  12.2*  --   --   HCT 40.4  --  37.5*  --   --   PLT 104*  --  104*  --   --   APTT  --  41*  --   --   --   LABPROT  --  14.6  --   --   --   INR  --  1.2  --   --   --   HEPARINUNFRC  --   --  >1.10* 0.45 0.52  CREATININE 0.81  --  0.72  --   --   CKTOTAL 75  --   --   --   --      Estimated Creatinine Clearance: 112.9 mL/min (by C-G formula based on SCr of 0.72 mg/dL). Assessment: 72 yo male presents with R arm swelling at PICC site.  Pharmacy consulted to dose IV heparin for +DVT.  US doppler showed acute deep vein thrombosis involving the right internal jugular vein, right subclavian vein, right axillary vein and right brachial veins.   11/16/2022 Heparin level drawn is therapeutic at 0.52 ;    Hg 13.2> 12.2, PLT 104 - low/same as admission No bleeding or line issues  reported per RN   Goal of Therapy:  Heparin level 0.3-0.7 units/ml Monitor platelets by anticoagulation protocol: Yes   Plan:  Continue heparin drip at 1700 units/hr Daily CBC & heparin level F/u to transition to [place], PharmD, BCPS 11/16/2022 4:33 PM

## 2022-11-16 NOTE — Progress Notes (Signed)
PROGRESS NOTE    Evan Morgan  [id number] DOB: 1951/03/26 DOA: 11/15/2022 PCP: Charlsie Merles, MD     Brief Narrative:  severe lumbar spinal stenosis s/p multiple surgeries bed to wheelchair bound, HTN, BPH,  mild intermittent asthma, h/o hepc, was in the hospital from January 29 to  11/03/2022 dueto discitis of lumbar region was discharged on IV antibiotics to be completed on 12/11/2022, returned complaining of right arm pain and swelling, found to have acute DVT   Subjective:  Continue have right arm swelling and pain  Report feeling constipated  Assessment & Plan:  Principal Problem:   Arm DVT (deep venous thromboembolism), acute, right (HCC) Active Problems:   Discitis of lumbar region   BPH (benign prostatic hyperplasia)   HTN (hypertension)   Asthma, chronic   Hypokalemia    Assessment and Plan:    Acute RUE DVT/picc line associated  -vascular surgery recommended conservative management with anticoagulation , IR recommended heparin dripx48hrs, if remain edematous, may consider removed RUE picc line and place LUE picc  Discitis of lumbar region Continue Ancef  Thrombocytopenia Appear chronic, close to baseline  HTN Stable on current regimen  BPH Continue home medication   Body mass index is 36.68 kg/m..meet obesity criteria   . Debility: Reports wheelchair bound, no feeling in lower extremity, reports chronic urinary incontinence but able to control his bowel, I would like to be lifted out of bed to use bedside commode when having BM   I have Reviewed nursing notes, Vitals, pain scores, I/o's, Lab results and  imaging results since pt's last encounter, details please see discussion above  I ordered the following labs:  Unresulted Labs (From admission, onward)     Start     Ordered   11/16/22 1530  Heparin level (unfractionated)  Once-Timed,   TIMED       Comments: peripheral stick/lab draw; do not draw from PICC    11/16/22 0856   11/16/22  0500  CBC  Daily,   R      11/15/22 1945   11/15/22 1847  Culture, blood (routine x 2)  BLOOD CULTURE X 2,   R (with STAT occurrences)     Question:  Patient immune status  Answer:  Normal   11/15/22 1846             DVT prophylaxis: On heparin drip   Code Status:   Code Status: Full Code  Family Communication: Patient Disposition:   Dispo: The patient is from: SNF              Anticipated d/c is to: SNF              Anticipated d/c date is: >24hrs  Antimicrobials:    Anti-infectives (From admission, onward)    Start     Dose/Rate Route Frequency Ordered Stop   11/16/22 0800  ceFAZolin (ANCEF) IVPB 2g/100 mL premix        2 g 200 mL/hr over 30 Minutes Intravenous Every 8 hours 11/15/22 2328     11/16/22 0015  ceFAZolin (ANCEF) IVPB 2g/100 mL premix        2 g 200 mL/hr over 30 Minutes Intravenous  Once 11/15/22 2321 11/16/22 0100          Objective: Vitals:   11/16/22 0243 11/16/22 0412 11/16/22 0628 11/16/22 1138  BP: 117/72 106/64 128/84 132/66  Pulse: 64 64 61 62  Resp: 18 17 16 17  $ Temp: 98.3 F (36.8 C) 98.5  F (36.9 C) 98.3 F (36.8 C) 97.7 F (36.5 C)  TempSrc: Oral   Oral  SpO2: 99% 98% 98% 99%  Weight:      Height:        Intake/Output Summary (Last 24 hours) at 11/16/2022 1150 Last data filed at 11/16/2022 [id number] Gross per 24 hour  Intake 594.98 ml  Output --  Net 594.98 ml   Filed Weights   11/15/22 1751  Weight: 119.3 kg    Examination:  General exam: alert, awake, communicative,calm, NAD Respiratory system: Clear to auscultation. Respiratory effort normal. Cardiovascular system:  RRR.  Gastrointestinal system: Abdomen is nondistended, soft and nontender.  Normal bowel sounds heard. Central nervous system: Alert and oriented.  Weakness in lower extremities , decreased sensation in lower extremities reported chronic  extremities: Right arm slightly edematous, PICC line in right arm Skin: No rashes, lesions or ulcers Psychiatry:  Judgement and insight appear normal. Mood & affect appropriate.     Data Reviewed: I have personally reviewed  labs and visualized  imaging studies since the last encounter and formulate the plan        Scheduled Meds:  atenolol  50 mg Oral Daily   Chlorhexidine Gluconate Cloth  6 each Topical Daily   finasteride  5 mg Oral Daily   gabapentin  900 mg Oral QHS   hydrALAZINE  10 mg Oral Daily   senna  1 tablet Oral BID   tamsulosin  0.8 mg Oral Daily   Continuous Infusions:   ceFAZolin (ANCEF) IV 2 g (11/16/22 0825)   heparin 1,700 Units/hr (11/16/22 1028)     LOS: 0 days   Time spent: 83mns  FFlorencia Reasons MD PhD FACP [facility]  Available via Epic secure chat 7am-7pm for nonurgent issues Please page for urgent issues To page the attending provider between 7A-7P or the covering provider during after hours 7P-7A, please log into the web site [email or web address] and access using universal [place] password for that web site. If you do not have the password, please call the hospital operator.    11/16/2022, 11:50 AM

## 2022-11-16 NOTE — TOC Initial Note (Signed)
Transition of Care [facility]) - Initial/Assessment Note    Patient Details  Name: Evan Morgan MRN: [id number] Date of Birth: 27-Jan-1951  [facility] CM/SW Contact:    Vassie Moselle, LCSW Phone Number: 11/16/2022, 10:26 AM  Clinical Narrative:                 Met with pt at bedside and confirmed pt has been at [facility] for STR while completing IV abx treatment. Pt plans to return to [facility] at discharge.  Per [facility] they will not be holding pt's bed and family will have to complete readmission paperwork for pt to return. Will confirm bed availability for pt to return once medically ready.    Expected Discharge Plan: Skilled Nursing Facility Barriers to Discharge: Continued Medical Work up   Patient Goals and CMS Choice Patient states their goals for this hospitalization and ongoing recovery are:: To go back to [facility] at discharge CMS Medicare.gov Compare Post Acute Care list provided to:: Patient Choice offered to / list presented to : Patient, Adult Children      Expected Discharge Plan and Services In-house Referral: Clinical Social Work Discharge Planning Services: NA Post Acute Care Choice: [place] Living arrangements for the past 2 months: [place]                 DME Arranged: N/A DME Agency: NA                  Prior Living Arrangements/Services Living arrangements for the past 2 months: [place] Lives with:: Facility Resident Patient language and need for interpreter reviewed:: Yes Do you feel safe going back to the place where you live?: Yes      Need for Family Participation in Patient Care: No (Comment) Care giver support system in place?: Yes (comment) Current home services: DME Criminal Activity/Legal Involvement Pertinent to Current Situation/Hospitalization: No - Comment as needed  Activities of Daily Living Home Assistive Devices/Equipment: Wheelchair ADL Screening  (condition at time of admission) Patient's cognitive ability adequate to safely complete daily activities?: No Is the patient deaf or have difficulty hearing?: Yes Does the patient have difficulty seeing, even when wearing glasses/contacts?: Yes Does the patient have difficulty concentrating, remembering, or making decisions?: No Patient able to express need for assistance with ADLs?: Yes Does the patient have difficulty dressing or bathing?: Yes Independently performs ADLs?: No Communication: Independent Dressing (OT): Needs assistance Is this a change from baseline?: Pre-admission baseline Grooming: Needs assistance Is this a change from baseline?: Pre-admission baseline Feeding: Independent Bathing: Needs assistance Is this a change from baseline?: Pre-admission baseline Toileting: Needs assistance Is this a change from baseline?: Pre-admission baseline In/Out Bed: Needs assistance Is this a change from baseline?: Pre-admission baseline Walks in Home: Dependent Is this a change from baseline?: Pre-admission baseline Does the patient have difficulty walking or climbing stairs?: Yes Weakness of Legs: Both Weakness of Arms/Hands: Both  Permission Sought/Granted Permission sought to share information with : Family Supports, Chartered certified accountant granted to share information with : Yes, Verbal Permission Granted  Share Information with NAME: [facility]  Permission granted to share info w AGENCY: SNF  Permission granted to share info w Relationship: Daughter  Permission granted to share info w Contact Information: [phone]  Emotional Assessment Appearance:: Appears stated age Attitude/Demeanor/Rapport: Engaged Affect (typically observed): Accepting Orientation: : Oriented to Self, Oriented to Place, Oriented to  Time, Oriented to Situation Alcohol / Substance Use: Not  Applicable Psych Involvement: No (comment)  Admission diagnosis:  Arm DVT (deep  venous thromboembolism), acute, right (HCC) [I82.621] Acute deep vein thrombosis (DVT) of right upper extremity, unspecified vein ([place]) [I82.621] Patient Active Problem List   Diagnosis Date Noted   Arm DVT (deep venous thromboembolism), acute, right ([place]) 11/15/2022   Asthma, chronic 11/15/2022   Hypokalemia 11/15/2022   Staphylococcus infection 11/03/2022   Hepatitis C antibody positive in blood 11/02/2022   Vertebral osteomyelitis ([place]) 10/31/2022   Urinary incontinence [id number]   Hardware complicating wound infection ([place]) 10/31/2022   Discitis 10/30/2022   Discitis of lumbar region 10/30/2022   Post-op pain 05/13/2022   Intractable low back pain 05/13/2022   Radiculopathy 07/23/2019   Abnormal gait 09/03/2017   Bilateral leg numbness 09/03/2017   Muscle spasm 11/07/2016   BPH (benign prostatic hyperplasia) 08/30/2016   GERD (gastroesophageal reflux disease) 08/30/2016   HTN (hypertension) 08/30/2016   S/P lumbar fusion 08/27/2015   DDD (degenerative disc disease), lumbar 04/01/2015   Foraminal stenosis of lumbar region 04/01/2015   Spinal fusion failure 04/01/2015   Midline low back pain with sciatica 12/16/2014   PCP:  Charlsie Merles, MD Pharmacy:   [facility], [place] [place] [address] Phone: [phone] Fax: [place], [place] [address] [place] [address] Phone: [phone] Fax: [phone]     Social Determinants of Health (SDOH) Social History: SDOH Screenings   Food Insecurity: No Food Insecurity (11/16/2022)  Housing: Low Risk  (11/16/2022)  Transportation Needs: No Transportation Needs (11/16/2022)  Utilities: Not At Risk (11/16/2022)  Tobacco Use: Low Risk  (11/15/2022)   SDOH Interventions: Housing Interventions: Intervention  Not Indicated   Readmission Risk Interventions     No data to display

## 2022-11-17 ENCOUNTER — Inpatient Hospital Stay (HOSPITAL_COMMUNITY): Payer: Medicare PPO

## 2022-11-17 DIAGNOSIS — I82621 Acute embolism and thrombosis of deep veins of right upper extremity: Secondary | ICD-10-CM | POA: Diagnosis not present

## 2022-11-17 LAB — CBC
HCT: 37.7 % — ABNORMAL LOW (ref 39.0–52.0)
Hemoglobin: 13.1 g/dL (ref 13.0–17.0)
MCH: 30.8 pg (ref 26.0–34.0)
MCHC: 34.7 g/dL (ref 30.0–36.0)
MCV: 88.7 fL (ref 80.0–100.0)
Platelets: 108 10*3/uL — ABNORMAL LOW (ref 150–400)
RBC: 4.25 MIL/uL (ref 4.22–5.81)
RDW: 13.9 % (ref 11.5–15.5)
WBC: 3.9 10*3/uL — ABNORMAL LOW (ref 4.0–10.5)
nRBC: 0 % (ref 0.0–0.2)

## 2022-11-17 LAB — HEPARIN LEVEL (UNFRACTIONATED): Heparin Unfractionated: 0.53 IU/mL (ref 0.30–0.70)

## 2022-11-17 NOTE — Progress Notes (Signed)
ANTICOAGULATION CONSULT NOTE - follow up  Pharmacy Consult for Heparin Indication: DVT  Allergies  Allergen Reactions   Amlodipine Other (See Comments)    Unknown reaction    Codeine Nausea And Vomiting   Lisinopril Cough    Patient Measurements: Height: 5' 11"$  (180.3 cm) Weight: 119.3 kg (263 lb) IBW/kg (Calculated) : 75.3 Heparin Dosing Weight: 101 kg  Vital Signs: Temp: 98.2 F (36.8 C) (02/16 0411) BP: 129/74 (02/16 0411) Pulse Rate: 65 (02/16 0411)  Labs: Recent Labs    11/15/22 1858 11/15/22 1858 11/15/22 2038 11/16/22 0451 11/16/22 0736 11/16/22 1524 11/17/22 0557  HGB 13.2  --   --  12.2*  --   --  13.1  HCT 40.4  --   --  37.5*  --   --  37.7*  PLT 104*  --   --  104*  --   --  108*  APTT  --   --  41*  --   --   --   --   LABPROT  --   --  14.6  --   --   --   --   INR  --   --  1.2  --   --   --   --   HEPARINUNFRC  --    < >  --  >1.10* 0.45 0.52 0.53  CREATININE 0.81  --   --  0.72  --   --   --   CKTOTAL 75  --   --   --   --   --   --    < > = values in this interval not displayed.     Estimated Creatinine Clearance: 112.9 mL/min (by C-G formula based on SCr of 0.72 mg/dL). Assessment: 72 yo male presents with R arm swelling at PICC site.  Pharmacy consulted to dose IV heparin for +DVT.  US doppler showed acute deep vein thrombosis involving the right internal jugular vein, right subclavian vein, right axillary vein and right brachial veins.   11/17/2022 AM Heparin level is therapeutic at 0.53   Hg 13.1 - stable; PLT 104> 108 - low/stable No bleeding or line issues reported per RN   Goal of Therapy:  Heparin level 0.3-0.7 units/ml Monitor platelets by anticoagulation protocol: Yes   Plan:  Continue heparin drip at 1700 units/hr Daily CBC & heparin level F/u plans for RUE PICC - possible removal if arm remains edematous F/u for transition to [place], Pharm.D Use secure chat for questions 11/17/2022 7:56 AM

## 2022-11-17 NOTE — Evaluation (Signed)
Occupational Therapy Evaluation Patient Details Name: Evan Morgan MRN: [id number] DOB: 03/22/1951 Today's Date: 11/17/2022   History of Present Illness Severe lumbar spinal stenosis s/p multiple surgeries bed to wheelchair bound, HTN, BPH,  mild intermittent asthma, h/o hepc, was in the hospital from January 29 to  11/03/2022 due to discitis of lumbar region was discharged on IV antibiotics to be completed on 12/11/2022, returned complaining of right arm pain and swelling, found to have acute DVT   Clinical Impression   [name] is a 72 year old man who presents with decreased ROM and strength of upper extremities, bilateral lower extremity weakness and impaired sensation, generalized weakness and decreased activity tolerance. Patient presents with pain and decreased functional use of upper extremities - limiting his ability to perform baseline transfers. He reports a month ago he was standing with rehab but fell in the parallel bars. He also reports transferring himself from wc to toilet a month ago. Recently he has been scooting and transferring to wheelchair using slide board. Currently he cannot use his upper extremities enough to effectively assist with transfers. Patient will benefit from skilled OT services while in hospital to improve deficits and learn compensatory strategies as needed in order to return to PLOF and reduce caregiver burden. He reports pain in shoulders from overuse and not being able to get cortisone injections due to PICC line.      Recommendations for follow up therapy are one component of a multi-disciplinary discharge planning process, led by the attending physician.  Recommendations may be updated based on patient status, additional functional criteria and insurance authorization.   Follow Up Recommendations  Skilled nursing-short term rehab (<3 hours/day)     Assistance Recommended at Discharge Frequent or constant Supervision/Assistance  Patient can return home  with the following Two people to help with walking and/or transfers;A lot of help with bathing/dressing/bathroom;Assistance with cooking/housework;Help with stairs or ramp for entrance;Assist for transportation    Functional Status Assessment  Patient has had a recent decline in their functional status and/or demonstrates limited ability to make significant improvements in function in a reasonable and predictable amount of time  Equipment Recommendations  None recommended by OT    Recommendations for Other Services       Precautions / Restrictions Precautions Precautions: Fall;Back Precaution Comments: for comfort Restrictions Weight Bearing Restrictions: No      Mobility Bed Mobility Overal bed mobility: Needs Assistance Bed Mobility: Rolling, Sidelying to Sit, Supine to Sit, Sit to Supine Rolling: +2 for physical assistance, Mod assist Sidelying to sit: +2 for physical assistance, Max assist   Sit to supine: Max assist, +2 for physical assistance        Transfers                   General transfer comment: deferred      Balance Overall balance assessment: Needs assistance Sitting-balance support: Feet supported, No upper extremity supported Sitting balance-Leahy Scale: Fair                                     ADL either performed or assessed with clinical judgement   ADL Overall ADL's : Needs assistance/impaired Eating/Feeding: Independent   Grooming: Independent;Bed level   Upper Body Bathing: Set up;Sitting   Lower Body Bathing: Maximal assistance;Bed level   Upper Body Dressing : Maximal assistance;Bed level   Lower Body Dressing: Total assistance;Bed level  Toilet Transfer Details (indicate cue type and reason): unable - used to be able to transfer to toilet about a month ago [place] and Hygiene: Total assistance;Bed level       Functional mobility during ADLs: +2 for physical assistance;Maximal  assistance       Vision Patient Visual Report: No change from baseline       Perception     Praxis      Pertinent Vitals/Pain Pain Assessment Pain Assessment: Faces Faces Pain Scale: Hurts little more Pain Location: Bilateral shoulders - arthritic and overuse Pain Descriptors / Indicators: Grimacing, Aching Pain Intervention(s): Monitored during session     Hand Dominance Right   Extremity/Trunk Assessment Upper Extremity Assessment Upper Extremity Assessment: RUE deficits/detail;LUE deficits/detail RUE Deficits / Details: Gross shoulder ROM but did not test strength due to reported pain. Reports he gets injections but cannot receive them until after PICC removed. Otherwise functional ROM and strength. Edematous. RUE Sensation: WNL RUE Coordination: WNL LUE Deficits / Details: AROM shoulder flexion about 100 with pain, reports due for injections to help shoulders; otherwise WFL, strength 4+/5 LUE Sensation: WNL LUE Coordination: WNL   Lower Extremity Assessment Lower Extremity Assessment: Defer to PT evaluation   Cervical / Trunk Assessment Cervical / Trunk Assessment: Back Surgery   Communication Communication Communication: No difficulties   Cognition Arousal/Alertness: Awake/alert Behavior During Therapy: WFL for tasks assessed/performed Overall Cognitive Status: Within Functional Limits for tasks assessed                                       General Comments       Exercises     Shoulder Instructions      Home Living Family/patient expects to be discharged to:: Skilled nursing facility                                 Additional Comments: at SNF since August 2023; reports has motorized w/c      Prior Functioning/Environment Prior Level of Function : Needs assist             Mobility Comments: used slide board for transfers or sit to stand lift, wanted to work more on standing but reports at facility in parallel  bars his legs buckled and they haven't tried standing since  (~3 weeks ago); pt reporting limited therapy last 3 weeks; Very limited mobility since August 2023 and was limited ambulator prior to that ADLs Comments: assisted for shower transfer, LB bathing and dressing, and toileting        OT Problem List: Decreased strength;Decreased activity tolerance;Pain;Obesity;Increased edema      OT Treatment/Interventions: Self-care/ADL training;Therapeutic exercise;DME and/or AE instruction;Therapeutic activities;Patient/family education;Modalities    OT Goals(Current goals can be found in the care plan section) Acute Rehab OT Goals Patient Stated Goal: to transfer to toilet again OT Goal Formulation: With patient Time For Goal Achievement: 12/01/22 Potential to Achieve Goals: Fair  OT Frequency: Min 2X/week    Co-evaluation PT/OT/SLP Co-Evaluation/Treatment: Yes (co eval) Reason for Co-Treatment: For patient/therapist safety;To address functional/ADL transfers          AM-PAC OT "6 Clicks" Daily Activity     Outcome Measure Help from another person eating meals?: None Help from another person taking care of personal grooming?: None Help from another person toileting, which includes using toliet, bedpan,  or urinal?: Total Help from another person bathing (including washing, rinsing, drying)?: A Lot Help from another person to put on and taking off regular upper body clothing?: A Lot Help from another person to put on and taking off regular lower body clothing?: Total 6 Click Score: 14   End of Session Nurse Communication: Mobility status  Activity Tolerance: Patient tolerated treatment well Patient left: in bed;with call bell/phone within reach;with bed alarm set  OT Visit Diagnosis: Muscle weakness (generalized) (M62.81)                Time: [id number] OT Time Calculation (min): 34 min Charges:  OT General Charges $OT Visit: 1 Visit OT Evaluation $OT Eval Low Complexity: 1  Low  Gustavo Lah, OTR/L [place]  Office [phone]   Lenward Chancellor 11/17/2022, 1:24 PM

## 2022-11-17 NOTE — Progress Notes (Signed)
R arm Picc in place, not being used , Per MD notes ,pt w/ DVT to R arm, heparin drip to L arm PIV. Blood draws to be done per lab

## 2022-11-17 NOTE — NC FL2 (Signed)
[place] LEVEL OF CARE FORM     IDENTIFICATION  Patient Name: Evan Morgan Birthdate: [birth year] Sex: male Admission Date (Current Location): 11/15/2022  [facility] and [place] Number:  Herbalist and Address:  [facility],  [place], [place]      Provider Number: [id number]  Attending Physician Name and Address:  Florencia Reasons, MD  Relative Name and Phone Number:  [place] [phone]    Current Level of Care: Hospital Recommended Level of Care: [place] Prior Approval Number:    Date Approved/Denied:   PASRR Number: [id number]  Discharge Plan: SNF    Current Diagnoses: Patient Active Problem List   Diagnosis Date Noted   Acute DVT (deep venous thrombosis) ([place]) 11/16/2022   Arm DVT (deep venous thromboembolism), acute, right ([place]) 11/15/2022   Asthma, chronic 11/15/2022   Hypokalemia 11/15/2022   Staphylococcus infection 11/03/2022   Hepatitis C antibody positive in blood 11/02/2022   Vertebral osteomyelitis ([place]) 10/31/2022   Urinary incontinence [id number]   Hardware complicating wound infection ([place]) 10/31/2022   Discitis 10/30/2022   Discitis of lumbar region 10/30/2022   Post-op pain 05/13/2022   Intractable low back pain 05/13/2022   Radiculopathy 07/23/2019   Abnormal gait 09/03/2017   Bilateral leg numbness 09/03/2017   Muscle spasm 11/07/2016   BPH (benign prostatic hyperplasia) 08/30/2016   GERD (gastroesophageal reflux disease) 08/30/2016   HTN (hypertension) 08/30/2016   S/P lumbar fusion 08/27/2015   DDD (degenerative disc disease), lumbar 04/01/2015   Foraminal stenosis of lumbar region 04/01/2015   Spinal fusion failure 04/01/2015   Midline low back pain with sciatica 12/16/2014    Orientation RESPIRATION BLADDER Height & Weight     Self, Time, Situation, Place  Normal Continent Weight: 263 lb (119.3 kg) Height:  5' 11"$  (180.3 cm)  BEHAVIORAL SYMPTOMS/MOOD NEUROLOGICAL BOWEL  NUTRITION STATUS      Continent Diet (See D/C summary)  AMBULATORY STATUS COMMUNICATION OF NEEDS Skin   Extensive Assist Verbally Normal                       Personal Care Assistance Level of Assistance  Bathing, Feeding, Dressing Bathing Assistance: Maximum assistance Feeding assistance: Limited assistance Dressing Assistance: Maximum assistance     Functional Limitations Info  Sight, Hearing, Speech Sight Info: Adequate Hearing Info: Adequate Speech Info: Adequate    SPECIAL CARE FACTORS FREQUENCY  PT (By licensed PT), OT (By licensed OT)     PT Frequency: 5x/wk OT Frequency: 5x/wk            Contractures Contractures Info: Not present    Additional Factors Info  Code Status, Allergies Code Status Info: FULL Allergies Info: Amlodipine, Codeine, Lisinopril           Current Medications (11/17/2022):  This is the current hospital active medication list Current Facility-Administered Medications  Medication Dose Route Frequency Provider Last Rate Last Admin   acetaminophen (TYLENOL) tablet 650 mg  650 mg Oral Q6H PRN Doutova, Anastassia, MD       Or   acetaminophen (TYLENOL) suppository 650 mg  650 mg Rectal Q6H PRN Doutova, Anastassia, MD       albuterol (PROVENTIL) (2.5 MG/3ML) 0.083% nebulizer solution 2.5 mg  2.5 mg Nebulization Q2H PRN Doutova, Anastassia, MD       atenolol (TENORMIN) tablet 50 mg  50 mg Oral Daily Doutova, Anastassia, MD   50 mg at 11/17/22 0924   ceFAZolin (ANCEF)  IVPB 2g/100 mL premix  2 g Intravenous Q8H Thomes Lolling, RPH 200 mL/hr at 11/17/22 [id number] 2 g at 11/17/22 [id number]   Chlorhexidine Gluconate Cloth 2 % PADS 6 each  6 each Topical Daily Florencia Reasons, MD   6 each at 11/17/22 0924   finasteride (PROSCAR) tablet 5 mg  5 mg Oral Daily Doutova, Nyoka Lint, MD   5 mg at 11/17/22 [id number]   gabapentin (NEURONTIN) capsule 900 mg  900 mg Oral QHS Doutova, Anastassia, MD   900 mg at 11/16/22 2220   heparin ADULT infusion 100 units/mL (25000  units/215m)  1,700 Units/hr Intravenous Continuous Doutova, Anastassia, MD 17 mL/hr at 11/17/22 0032 1,700 Units/hr at 11/17/22 0032   hydrALAZINE (APRESOLINE) tablet 10 mg  10 mg Oral Daily Doutova, Anastassia, MD   10 mg at 11/17/22 0924   methocarbamol (ROBAXIN) tablet 500-1,000 mg  500-1,000 mg Oral Q6H PRN Doutova, ANyoka Lint MD       oxyCODONE-acetaminophen (PERCOCET/ROXICET) 5-325 MG per tablet 1 tablet  1 tablet Oral Q6H PRN DToy Baker MD   1 tablet at 11/16/22 1847   polyethylene glycol (MIRALAX / GLYCOLAX) packet 17 g  17 g Oral Daily XFlorencia Reasons MD   17 g at 11/17/22 [id number]  senna-docusate (Senokot-S) tablet 1 tablet  1 tablet Oral BID XFlorencia Reasons MD   1 tablet at 11/17/22 [id number]  tamsulosin (FLOMAX) capsule 0.8 mg  0.8 mg Oral Daily DToy Baker MD   0.8 mg at 11/17/22 [id number]    Discharge Medications: Please see discharge summary for a list of discharge medications.  Relevant Imaging Results:  Relevant Lab Results:   Additional Information SS#: [id number] MVassie Moselle LCSW

## 2022-11-17 NOTE — Progress Notes (Signed)
PROGRESS NOTE    Evan Morgan  [id number] DOB: Aug 01, 1951 DOA: 11/15/2022 PCP: Charlsie Merles, MD     Brief Narrative:  severe lumbar spinal stenosis s/p multiple surgeries bed to wheelchair bound, HTN, BPH,  mild intermittent asthma, h/o hepc, was in the hospital from January 29 to  11/03/2022 dueto discitis of lumbar region was discharged on IV antibiotics to be completed on 12/11/2022, returned complaining of right arm pain and swelling, found to have acute DVT   Subjective:  He states he is not feeling better, though he can use his right arm to shave, he is not able to use his arms to help transfer  Reports progressive bilateral shoulder pain for the last few months  Had bm  Assessment & Plan:  Principal Problem:   Arm DVT (deep venous thromboembolism), acute, right (HCC) Active Problems:   Discitis of lumbar region   BPH (benign prostatic hyperplasia)   HTN (hypertension)   Asthma, chronic   Hypokalemia   Acute DVT (deep venous thrombosis) (H[facility])    Assessment and Plan:    Acute RUE DVT/picc line associated  -vascular surgery recommended conservative management with anticoagulation , IR recommended heparin dripx48hrs from 2/15, if remain edematous, may consider removed RUE picc line and place LUE picc  Discitis of lumbar region Continue Ancef  Thrombocytopenia Appear chronic, close to baseline  HTN Stable on current regimen  BPH Continue home medication   Body mass index is 36.68 kg/m..meet obesity criteria   chronic bilateral shoulder pain, reports getting worse, request x ray, ordered  Debility: Reports wheelchair bound, no feeling in lower extremity, reports chronic urinary incontinence but able to control his bowel, I would like to be lifted out of bed to use bedside commode when having BM   I have Reviewed nursing notes, Vitals, pain scores, I/o's, Lab results and  imaging results since pt's last encounter, details please see discussion above   I ordered the following labs:  Unresulted Labs (From admission, onward)     Start     Ordered   11/18/22 [id number]  Basic metabolic panel  Daily,   R     Question:  Specimen collection method  Answer:  IV Team=IV Team collect   11/17/22 0901   11/17/22 0500  Heparin level (unfractionated)  Daily,   R     Question:  Specimen collection method  Answer:  IV Team=IV Team collect   11/16/22 1635   11/16/22 0500  CBC  Daily,   R      11/15/22 1945             DVT prophylaxis: On heparin drip   Code Status:   Code Status: Full Code  Family Communication: Patient Disposition:   Dispo: The patient is from: SNF              Anticipated d/c is to: SNF              Anticipated d/c date is: >24hrs  Antimicrobials:    Anti-infectives (From admission, onward)    Start     Dose/Rate Route Frequency Ordered Stop   11/16/22 0800  ceFAZolin (ANCEF) IVPB 2g/100 mL premix        2 g 200 mL/hr over 30 Minutes Intravenous Every 8 hours 11/15/22 2328     11/16/22 0015  ceFAZolin (ANCEF) IVPB 2g/100 mL premix        2 g 200 mL/hr over 30 Minutes Intravenous  Once 11/15/22 2321 11/16/22 0100  Objective: Vitals:   11/16/22 2039 11/17/22 0411 11/17/22 0924 11/17/22 1404  BP: 126/72 129/74 117/66 121/67  Pulse: 63 65 71 (!) 59  Resp: 18 16  20  $ Temp: 98.1 F (36.7 C) 98.2 F (36.8 C)  98.6 F (37 C)  TempSrc:    Oral  SpO2: 98% 97%  99%  Weight:      Height:        Intake/Output Summary (Last 24 hours) at 11/17/2022 1438 Last data filed at 11/17/2022 1100 Gross per 24 hour  Intake 1692.16 ml  Output 1300 ml  Net 392.16 ml   Filed Weights   11/15/22 1751  Weight: 119.3 kg    Examination:  General exam: alert, awake, communicative,calm, NAD Respiratory system: Clear to auscultation. Respiratory effort normal. Cardiovascular system:  RRR.  Gastrointestinal system: Abdomen is nondistended, soft and nontender.  Normal bowel sounds heard. Central nervous system:  Alert and oriented.  Weakness in lower extremities , decreased sensation in lower extremities reported chronic  extremities: Right arm slightly edematous, PICC line in right arm Skin: No rashes, lesions or ulcers Psychiatry: Judgement and insight appear normal. Mood & affect appropriate.     Data Reviewed: I have personally reviewed  labs and visualized  imaging studies since the last encounter and formulate the plan        Scheduled Meds:  atenolol  50 mg Oral Daily   Chlorhexidine Gluconate Cloth  6 each Topical Daily   finasteride  5 mg Oral Daily   gabapentin  900 mg Oral QHS   hydrALAZINE  10 mg Oral Daily   polyethylene glycol  17 g Oral Daily   senna-docusate  1 tablet Oral BID   tamsulosin  0.8 mg Oral Daily   Continuous Infusions:   ceFAZolin (ANCEF) IV 2 g (11/17/22 0929)   heparin 1,700 Units/hr (11/17/22 0032)     LOS: 1 day   Time spent: 39mns  FFlorencia Reasons MD PhD FACP [facility]  Available via Epic secure chat 7am-7pm for nonurgent issues Please page for urgent issues To page the attending provider between 7A-7P or the covering provider during after hours 7P-7A, please log into the web site [email or web address] and access using universal [place] password for that web site. If you do not have the password, please call the hospital operator.    11/17/2022, 2:38 PM

## 2022-11-17 NOTE — Evaluation (Signed)
Physical Therapy Evaluation Patient Details Name: Evan Morgan MRN: [id number] DOB: 1951/03/15 Today's Date: 11/17/2022  History of Present Illness  Pt is 72 yo male admitted 11/15/22 with R UE DVT with heparin initiated.  Pt with severe lumbar spinal stenosis s/p multiple surgeries (last sx was 7/23)  and was in the hospital from 1/29 to 11/25/22 due to discitis of lumbar region was discharged on IV antibiotics. Pt with hx including multiple back surgeries, HTN, BPH,  mild intermittent asthma, h/o hepc,  Clinical Impression  Pt admitted with above diagnosis. Pt has been in hospital or SNF since back surgery on 7/23.  He reports he took some steps in parallel bars but with heavy use of UE and knees buckling.  Reports knees buckled 3 weeks ago with SNF therapy and they haven't stood him since.  Reports he was able to lateral scoot with minimal assistance.  Today, pt reports no sensation (light or deep pressure) in either lower extremity.  He has significant weakness in bil LE.  Pt is heavily dependent on bil UE but now has shoulder pain suspected arthritis and overuse , as well as, then new DVT in R UE.  Noted MRI from 10/2022 continued to report severe spinal stenosis with impingement of cauda equina.  Unfortunately, discussed with pt that standing goal is likely not reasonable (based on no sensation, severe weakness, MRI report, limited progress at rehab) and when he was standing at rehab he was heavily dependent on his UE and now with further shoulder pain.  Discussed likely need to work on bed to w/c transfers via lateral scoots for safety and to preserve shoulder integrity.  Pt currently with functional limitations due to the deficits listed below (see PT Problem List). Pt will benefit from skilled PT to increase their independence and safety with mobility to allow discharge to the venue listed below.      Noted MRI in January: Persistent severe spinal canal stenosis with impingement of the cauda equina  nerve roots at L4-L5, and moderate to severe right and moderate left neural foraminal stenosis at this level.     Recommendations for follow up therapy are one component of a multi-disciplinary discharge planning process, led by the attending physician.  Recommendations may be updated based on patient status, additional functional criteria and insurance authorization.  Follow Up Recommendations Skilled nursing-short term rehab (<3 hours/day) Can patient physically be transported by private vehicle: No    Assistance Recommended at Discharge Intermittent Supervision/Assistance  Patient can return home with the following  Two people to help with walking and/or transfers;Two people to help with bathing/dressing/bathroom    Equipment Recommendations None recommended by PT  Recommendations for Other Services       Functional Status Assessment Patient has had a recent decline in their functional status and demonstrates the ability to make significant improvements in function in a reasonable and predictable amount of time.     Precautions / Restrictions Precautions Precautions: Fall;Back Precaution Comments: back for comfort Restrictions Weight Bearing Restrictions: No      Mobility  Bed Mobility Overal bed mobility: Needs Assistance Bed Mobility: Rolling, Sidelying to Sit, Sit to Supine Rolling: +2 for physical assistance, Mod assist Sidelying to sit: +2 for physical assistance, Max assist   Sit to supine: Max assist, +2 for physical assistance   General bed mobility comments: Pt requiring assist for legs and trunk with all bed mobility    Transfers  General transfer comment: deferred - not safe for standing and shoulders too painful for lateral scoot    Ambulation/Gait                  Stairs            Wheelchair Mobility    Modified Rankin (Stroke Patients Only)       Balance Overall balance assessment: Needs  assistance Sitting-balance support: Feet supported, No upper extremity supported Sitting balance-Leahy Scale: Fair Sitting balance - Comments: Pt able to sit EOB for 10 mins during session.     Standing balance-Leahy Scale: Zero Standing balance comment: deferred                             Pertinent Vitals/Pain Pain Assessment Pain Assessment: Faces Faces Pain Scale: Hurts little more Pain Location: Bilateral shoulders - arthritic and overuse Pain Descriptors / Indicators: Grimacing, Aching Pain Intervention(s): Limited activity within patient's tolerance, Monitored during session    Home Living Family/patient expects to be discharged to:: Skilled nursing facility                   Additional Comments: at SNF since August 2023; reports has motorized w/c    Prior Function Prior Level of Function : Needs assist             Mobility Comments: used slide board for transfers or sit to stand lift, wanted to work more on standing but reports at facility in parallel bars his legs buckled and they haven't tried standing since  (~3 weeks ago); pt reporting limited therapy last 3 weeks; Very limited mobility since August 2023 and was limited ambulator prior to that ADLs Comments: assisted for shower transfer, LB bathing and dressing, and toileting     Hand Dominance   Dominant Hand: Right    Extremity/Trunk Assessment   Upper Extremity Assessment Upper Extremity Assessment: Defer to OT evaluation RUE Deficits / Details: Gross shoulder ROM but did not test strength due to reported pain. Reports he gets injections but cannot receive them until after PICC removed. Otherwise functional ROM and strength. Edematous. RUE Sensation: WNL RUE Coordination: WNL LUE Deficits / Details: AROM shoulder flexion about 100 with pain, reports due for injections to help shoulders; otherwise WFL, strength 4+/5 LUE Sensation: WNL LUE Coordination: WNL    Lower Extremity  Assessment Lower Extremity Assessment: LLE deficits/detail;RLE deficits/detail RLE Deficits / Details: ROM: WFL; MMT: 1/5 hip , 2/5 knee ext, 2/5 ankle DF; edematous; NO sensation throughout lower extremities LLE Deficits / Details: ROM: WFL; MMT: 1/5 hip , 2/5 knee ext, 2/5 ankle DF; edematous; NO sensation throughout lower extremities    Cervical / Trunk Assessment Cervical / Trunk Assessment: Back Surgery  Communication   Communication: No difficulties  Cognition Arousal/Alertness: Awake/alert Behavior During Therapy: WFL for tasks assessed/performed Overall Cognitive Status: Within Functional Limits for tasks assessed                                          General Comments      Exercises     Assessment/Plan    PT Assessment Patient needs continued PT services  PT Problem List Decreased activity tolerance;Impaired sensation;Decreased mobility;Decreased knowledge of use of DME;Decreased balance;Decreased strength;Pain;Decreased knowledge of precautions       PT Treatment Interventions DME instruction;Functional mobility  training;Balance training;Patient/family education;Therapeutic activities;Therapeutic exercise;Neuromuscular re-education;Wheelchair mobility training    PT Goals (Current goals can be found in the Care Plan section)  Acute Rehab PT Goals Patient Stated Goal: To stand PT Goal Formulation: With patient Time For Goal Achievement: 12/01/22 Potential to Achieve Goals: Fair (fair for bed mobility/lateral scoots; poor to none for standing) Additional Goals Additional Goal #1: Pt will increase leg strength to 2/5 to assist with transfers    Frequency Min 2X/week     Co-evaluation PT/OT/SLP Co-Evaluation/Treatment: Yes Reason for Co-Treatment: For patient/therapist safety;To address functional/ADL transfers           AM-PAC PT "6 Clicks" Mobility  Outcome Measure Help needed turning from your back to your side while in a flat bed  without using bedrails?: Total Help needed moving from lying on your back to sitting on the side of a flat bed without using bedrails?: Total Help needed moving to and from a bed to a chair (including a wheelchair)?: Total Help needed standing up from a chair using your arms (e.g., wheelchair or bedside chair)?: Total Help needed to walk in hospital room?: Total Help needed climbing 3-5 steps with a railing? : Total 6 Click Score: 6    End of Session   Activity Tolerance: Patient tolerated treatment well Patient left: in bed;with bed alarm set;with call bell/phone within reach Nurse Communication: Mobility status;Need for lift equipment PT Visit Diagnosis: Other abnormalities of gait and mobility (R26.89);Other symptoms and signs involving the nervous system (R29.898)    Time: [id number] PT Time Calculation (min) (ACUTE ONLY): 27 min   Charges:   PT Evaluation $PT Eval Moderate Complexity: 1 Melina Schools, PT Acute Rehab [facility] Rehab [phone]   Karlton Lemon 11/17/2022, 1:49 PM

## 2022-11-18 DIAGNOSIS — I82621 Acute embolism and thrombosis of deep veins of right upper extremity: Secondary | ICD-10-CM | POA: Diagnosis not present

## 2022-11-18 LAB — BASIC METABOLIC PANEL
Anion gap: 9 (ref 5–15)
BUN: 12 mg/dL (ref 8–23)
CO2: 23 mmol/L (ref 22–32)
Calcium: 9 mg/dL (ref 8.9–10.3)
Chloride: 103 mmol/L (ref 98–111)
Creatinine, Ser: 0.79 mg/dL (ref 0.61–1.24)
GFR, Estimated: >60 mL/min (ref >60)
Glucose, Bld: 92 mg/dL (ref 70–99)
Potassium: 3.8 mmol/L (ref 3.5–5.1)
Sodium: 135 mmol/L (ref 135–145)

## 2022-11-18 LAB — CBC
HCT: 36.9 % — ABNORMAL LOW (ref 39.0–52.0)
Hemoglobin: 12.3 g/dL — ABNORMAL LOW (ref 13.0–17.0)
MCH: 29.8 pg (ref 26.0–34.0)
MCHC: 33.3 g/dL (ref 30.0–36.0)
MCV: 89.3 fL (ref 80.0–100.0)
Platelets: 120 10*3/uL — ABNORMAL LOW (ref 150–400)
RBC: 4.13 MIL/uL — ABNORMAL LOW (ref 4.22–5.81)
RDW: 14 % (ref 11.5–15.5)
WBC: 4 10*3/uL (ref 4.0–10.5)
nRBC: 0 % (ref 0.0–0.2)

## 2022-11-18 LAB — HEPARIN LEVEL (UNFRACTIONATED): Heparin Unfractionated: 0.47 IU/mL (ref 0.30–0.70)

## 2022-11-18 NOTE — Progress Notes (Signed)
ANTICOAGULATION CONSULT NOTE - follow up  Pharmacy Consult for Heparin Indication: DVT  Allergies  Allergen Reactions   Amlodipine Other (See Comments)    Unknown reaction    Codeine Nausea And Vomiting   Lisinopril Cough    Patient Measurements: Height: 5' 11"$  (180.3 cm) Weight: 119.3 kg (263 lb) IBW/kg (Calculated) : 75.3 Heparin Dosing Weight: 101 kg  Vital Signs: Temp: 98.2 F (36.8 C) (02/17 0504) Temp Source: Oral (02/16 2011) BP: 139/70 (02/17 0504) Pulse Rate: 66 (02/17 0504)  Labs: Recent Labs    11/15/22 1858 11/15/22 2038 11/16/22 0451 11/16/22 0736 11/16/22 1524 11/17/22 0557 11/18/22 0542  HGB 13.2  --  12.2*  --   --  13.1 12.3*  HCT 40.4  --  37.5*  --   --  37.7* 36.9*  PLT 104*  --  104*  --   --  108* 120*  APTT  --  41*  --   --   --   --   --   LABPROT  --  14.6  --   --   --   --   --   INR  --  1.2  --   --   --   --   --   HEPARINUNFRC  --   --  >1.10*   < > 0.52 0.53 0.47  CREATININE 0.81  --  0.72  --   --   --  0.79  CKTOTAL 75  --   --   --   --   --   --    < > = values in this interval not displayed.     Estimated Creatinine Clearance: 112.9 mL/min (by C-G formula based on SCr of 0.79 mg/dL). Assessment: 72 yo male presents with R arm swelling at PICC site.  Pharmacy consulted to dose IV heparin for +DVT.  US doppler showed acute deep vein thrombosis involving the right internal jugular vein, right subclavian vein, right axillary vein and right brachial veins.   11/18/2022 Heparin level is therapeutic at 0.47 CBC:  Hgb decreased to 12.3 (consistent with Hgb on 2/15), Plt up to 120, low/stable No bleeding or line issues reported per RN   Goal of Therapy:  Heparin level 0.3-0.7 units/ml Monitor platelets by anticoagulation protocol: Yes   Plan:  Continue heparin drip at 1700 units/hr Daily CBC & heparin level F/u for transition to long-term anticoagulation  Gretta Arab PharmD, BCPS [facility] main pharmacy [phone] 11/18/2022  7:28 AM

## 2022-11-18 NOTE — Progress Notes (Signed)
I attest to student documentation.  Ammie Ferrier, MSN-RN [place]

## 2022-11-18 NOTE — Progress Notes (Signed)
PROGRESS NOTE    Evan Morgan  [id number] DOB: Nov 14, 1950 DOA: 11/15/2022 PCP: Charlsie Merles, MD     Brief Narrative:  severe lumbar spinal stenosis s/p multiple surgeries bed to wheelchair bound, HTN, BPH,  mild intermittent asthma, h/o hepc, was in the hospital from January 29 to  11/03/2022 dueto discitis of lumbar region was discharged on IV antibiotics to be completed on 12/11/2022, returned complaining of right arm pain and swelling, found to have acute DVT   Subjective:  He states right arm /hand edema is 40% better,   he can use his right arm to shave, he is not able to use his arms to help transfer Heparin drip and iv abx is running through PIV, picc line right arm is not used  Assessment & Plan:  Principal Problem:   Arm DVT (deep venous thromboembolism), acute, right (HCC) Active Problems:   Discitis of lumbar region   BPH (benign prostatic hyperplasia)   HTN (hypertension)   Asthma, chronic   Hypokalemia   Acute DVT (deep venous thrombosis) (HCC)    Assessment and Plan:    Acute RUE DVT/picc line associated  -vascular surgery recommended conservative management with anticoagulation , IR recommended heparin dripx48hrs from 2/15, if remain edematous, may consider removed RUE picc line and place LUE picc - IR will eval patient on 2/18  Discitis of lumbar region Continue Ancef  Thrombocytopenia Appear chronic, close to baseline  HTN Stable on current regimen  BPH Continue home medication   Body mass index is 36.68 kg/m..meet obesity criteria   chronic bilateral shoulder pain, reports getting worse, x ray no acute bony abnormalities, he reports getting steroids injection from ortho office, recommend continue to follow with ortho  Debility: Reports wheelchair bound, no feeling in lower extremity, reports chronic urinary incontinence but able to control his bowel, I would like to be lifted out of bed to use bedside commode when having BM   I have  Reviewed nursing notes, Vitals, pain scores, I/o's, Lab results and  imaging results since pt's last encounter, details please see discussion above  I ordered the following labs:  Unresulted Labs (From admission, onward)     Start     Ordered   11/18/22 [id number]  Basic metabolic panel  Daily,   R     Question:  Specimen collection method  Answer:  IV Team=IV Team collect   11/17/22 0901   11/17/22 0500  Heparin level (unfractionated)  Daily,   R     Question:  Specimen collection method  Answer:  IV Team=IV Team collect   11/16/22 1635   11/16/22 0500  CBC  Daily,   R      11/15/22 1945             DVT prophylaxis: On heparin drip   Code Status:   Code Status: Full Code  Family Communication: Patient Disposition:   Dispo: The patient is from: SNF              Anticipated d/c is to: SNF              Anticipated d/c date is: >24hrs, once cleared by IR , need to transition to oral anticoagulation prior to discharge  Antimicrobials:    Anti-infectives (From admission, onward)    Start     Dose/Rate Route Frequency Ordered Stop   11/16/22 0800  ceFAZolin (ANCEF) IVPB 2g/100 mL premix        2 g 200 mL/hr over  30 Minutes Intravenous Every 8 hours 11/15/22 2328     11/16/22 0015  ceFAZolin (ANCEF) IVPB 2g/100 mL premix        2 g 200 mL/hr over 30 Minutes Intravenous  Once 11/15/22 2321 11/16/22 0100          Objective: Vitals:   11/17/22 2011 11/18/22 0504 11/18/22 0730 11/18/22 1336  BP: 139/69 139/70 116/60 137/74  Pulse: 68 66 66 (!) 57  Resp: 18 16 18 20  $ Temp: 97.8 F (36.6 C) 98.2 F (36.8 C) 98.6 F (37 C) 97.6 F (36.4 C)  TempSrc: Oral  Oral Oral  SpO2: 98% 98% 99% 100%  Weight:      Height:        Intake/Output Summary (Last 24 hours) at 11/18/2022 1706 Last data filed at 11/18/2022 1634 Gross per 24 hour  Intake 792.7 ml  Output --  Net 792.7 ml   Filed Weights   11/15/22 1751  Weight: 119.3 kg    Examination:  General exam: alert,  awake, communicative,calm, NAD Respiratory system: Clear to auscultation. Respiratory effort normal. Cardiovascular system:  RRR.  Gastrointestinal system: Abdomen is nondistended, soft and nontender.  Normal bowel sounds heard. Central nervous system: Alert and oriented.  Weakness in lower extremities , decreased sensation in lower extremities reported chronic  extremities: Right arm slightly edematous, PICC line in right arm Skin: No rashes, lesions or ulcers Psychiatry: Judgement and insight appear normal. Mood & affect appropriate.     Data Reviewed: I have personally reviewed  labs and visualized  imaging studies since the last encounter and formulate the plan        Scheduled Meds:  atenolol  50 mg Oral Daily   Chlorhexidine Gluconate Cloth  6 each Topical Daily   finasteride  5 mg Oral Daily   gabapentin  900 mg Oral QHS   hydrALAZINE  10 mg Oral Daily   polyethylene glycol  17 g Oral Daily   senna-docusate  1 tablet Oral BID   tamsulosin  0.8 mg Oral Daily   Continuous Infusions:   ceFAZolin (ANCEF) IV 2 g (11/18/22 1632)   heparin 1,700 Units/hr (11/18/22 1634)     LOS: 2 days   Time spent: 86mns  FFlorencia Reasons MD PhD FACP [facility]  Available via Epic secure chat 7am-7pm for nonurgent issues Please page for urgent issues To page the attending provider between 7A-7P or the covering provider during after hours 7P-7A, please log into the web site [email or web address] and access using universal [place] password for that web site. If you do not have the password, please call the hospital operator.    11/18/2022, 5:06 PM

## 2022-11-18 NOTE — Progress Notes (Signed)
Pharmacy Antibiotic Note  Evan Morgan is a 72 y.o. male admitted on 11/15/2022 with  hardware associated osteomyelitis discitis .  Previous IR guided aspirate yielded Staphylococcus capitis sensitive to Ancef.  He is currently on IV Ancef for 6 weeks which he will complete on 12/11/22 with plans to transition to oral cefadroxil afterwards per ID.   Pharmacy has been consulted for Ancef dosing/monitoring while inpatient. Renal function stable.   Plan: Continue Ancef 2gm IV q8h through 3/11 Dosage remains stable and need for further dosage adjustment appears unlikely at present.  Will sign off at this time.  Please reconsult if a change in clinical status warrants re-evaluation of dosage.    Height: 5' 11"$  (180.3 cm) Weight: 119.3 kg (263 lb) IBW/kg (Calculated) : 75.3  Temp (24hrs), Avg:98.2 F (36.8 C), Min:97.8 F (36.6 C), Max:98.6 F (37 C)  Recent Labs  Lab 11/15/22 1858 11/16/22 0451 11/17/22 0557 11/18/22 0542  WBC 4.3 4.2 3.9* 4.0  CREATININE 0.81 0.72  --  0.79  LATICACIDVEN 1.1  --   --   --      Estimated Creatinine Clearance: 112.9 mL/min (by C-G formula based on SCr of 0.79 mg/dL).    Allergies  Allergen Reactions   Amlodipine Other (See Comments)    Unknown reaction    Codeine Nausea And Vomiting   Lisinopril Cough    Thank you for allowing pharmacy to be a part of this patient's care.  Gretta Arab PharmD, BCPS [facility] main pharmacy [phone] 11/18/2022 7:31 AM

## 2022-11-18 NOTE — TOC Progression Note (Signed)
Transition of Care [facility]) - Progression Note    Patient Details  Name: Evan Morgan MRN: [id number] Date of Birth: [birth year]  Transition of Care [facility]) CM/SW Contact  Henrietta Dine, RN Phone Number: 11/18/2022, 12:01 PM  Clinical Narrative:    Pt not ready for d/c' TOC will con't to follow.   Expected Discharge Plan: [place] Barriers to Discharge: Continued Medical Work up  Expected Discharge Plan and Services In-house Referral: Clinical Social Work Discharge Planning Services: NA Post Acute Care Choice: [place] Living arrangements for the past 2 months: [place]                 DME Arranged: N/A DME Agency: NA                   Social Determinants of Health (SDOH) Interventions SDOH Screenings   Food Insecurity: No Food Insecurity (11/16/2022)  Housing: Low Risk  (11/16/2022)  Transportation Needs: No Transportation Needs (11/16/2022)  Utilities: Not At Risk (11/16/2022)  Tobacco Use: Low Risk  (11/15/2022)    Readmission Risk Interventions     No data to display

## 2022-11-19 DIAGNOSIS — I82621 Acute embolism and thrombosis of deep veins of right upper extremity: Secondary | ICD-10-CM | POA: Diagnosis not present

## 2022-11-19 LAB — BASIC METABOLIC PANEL
Anion gap: 9 (ref 5–15)
BUN: 10 mg/dL (ref 8–23)
CO2: 23 mmol/L (ref 22–32)
Calcium: 9.3 mg/dL (ref 8.9–10.3)
Chloride: 103 mmol/L (ref 98–111)
Creatinine, Ser: 0.7 mg/dL (ref 0.61–1.24)
GFR, Estimated: >60 mL/min (ref >60)
Glucose, Bld: 92 mg/dL (ref 70–99)
Potassium: 4 mmol/L (ref 3.5–5.1)
Sodium: 135 mmol/L (ref 135–145)

## 2022-11-19 LAB — CBC
HCT: 39.3 % (ref 39.0–52.0)
Hemoglobin: 12.7 g/dL — ABNORMAL LOW (ref 13.0–17.0)
MCH: 29.4 pg (ref 26.0–34.0)
MCHC: 32.3 g/dL (ref 30.0–36.0)
MCV: 91 fL (ref 80.0–100.0)
Platelets: 122 10*3/uL — ABNORMAL LOW (ref 150–400)
RBC: 4.32 MIL/uL (ref 4.22–5.81)
RDW: 14.1 % (ref 11.5–15.5)
WBC: 3.9 10*3/uL — ABNORMAL LOW (ref 4.0–10.5)
nRBC: 0 % (ref 0.0–0.2)

## 2022-11-19 LAB — HEPARIN LEVEL (UNFRACTIONATED): Heparin Unfractionated: 0.5 IU/mL (ref 0.30–0.70)

## 2022-11-19 NOTE — Progress Notes (Signed)
PROGRESS NOTE    Evan Morgan  [id number] DOB: [birth year] DOA: 11/15/2022 PCP: Charlsie Merles, MD     Brief Narrative:  severe lumbar spinal stenosis s/p multiple surgeries bed to wheelchair bound, HTN, BPH,  mild intermittent asthma, h/o hepc, was in the hospital from January 29 to  11/03/2022 dueto discitis of lumbar region was discharged on IV antibiotics to be completed on 12/11/2022, returned complaining of right arm pain and swelling, found to have acute DVT   Subjective:  He states right arm /hand edema is 70% better,   Heparin drip and iv abx is running through PIV, picc line right arm is not used  Assessment & Plan:  Principal Problem:   Arm DVT (deep venous thromboembolism), acute, right (HCC) Active Problems:   Discitis of lumbar region   BPH (benign prostatic hyperplasia)   HTN (hypertension)   Asthma, chronic   Hypokalemia   Acute DVT (deep venous thrombosis) (HCC)    Assessment and Plan:    Acute RUE DVT/picc line associated  -vascular surgery recommended conservative management with anticoagulation - IR recommended heparin drip for now, -repeat venous doppler  to decide on PICC line exchange over wire versus removal old PICC and replacement new PICC on left upper extremity - IR input appreciated, will follow recommendation  Discitis of lumbar region Continue Ancef, last day on 3/11  Thrombocytopenia Appear chronic, close to baseline  HTN Stable on current regimen  BPH Continue home medication   Body mass index is 36.68 kg/m..meet obesity criteria   chronic bilateral shoulder pain, reports getting worse, x ray no acute bony abnormalities, he reports getting steroids injection from ortho office, recommend continue to follow with ortho  he can use his right arm to shave, he is not able to use his arms to help transfer  Debility: Reports wheelchair bound, no feeling in lower extremity, reports chronic urinary incontinence but able to control  his bowel, he would like to be lifted out of bed to use bedside commode when having BM   I have Reviewed nursing notes, Vitals, pain scores, I/o's, Lab results and  imaging results since pt's last encounter, details please see discussion above  I ordered the following labs:  Unresulted Labs (From admission, onward)     Start     Ordered   11/18/22 [id number]  Basic metabolic panel  Daily,   R     Question:  Specimen collection method  Answer:  IV Team=IV Team collect   11/17/22 0901   11/17/22 0500  Heparin level (unfractionated)  Daily,   R     Question:  Specimen collection method  Answer:  IV Team=IV Team collect   11/16/22 1635   11/16/22 0500  CBC  Daily,   R      11/15/22 1945             DVT prophylaxis: On heparin drip   Code Status:   Code Status: Full Code  Family Communication: Patient Disposition:   Dispo: The patient is from: SNF              Anticipated d/c is to: SNF              Anticipated d/c date is: >24hrs, once cleared by IR , need to transition to oral anticoagulation prior to discharge  Antimicrobials:    Anti-infectives (From admission, onward)    Start     Dose/Rate Route Frequency Ordered Stop   11/16/22 0800  ceFAZolin (ANCEF)  IVPB 2g/100 mL premix        2 g 200 mL/hr over 30 Minutes Intravenous Every 8 hours 11/15/22 2328     11/16/22 0015  ceFAZolin (ANCEF) IVPB 2g/100 mL premix        2 g 200 mL/hr over 30 Minutes Intravenous  Once 11/15/22 2321 11/16/22 0100          Objective: Vitals:   11/18/22 2057 11/19/22 0627 11/19/22 0954 11/19/22 1331  BP: (!) 145/75 120/65 118/65 125/72  Pulse: 65 (!) 59 66 60  Resp: 18 18  18  $ Temp: 97.7 F (36.5 C) (!) 97.5 F (36.4 C)  98.1 F (36.7 C)  TempSrc: Oral Oral  Oral  SpO2: 99% 96%  99%  Weight:      Height:        Intake/Output Summary (Last 24 hours) at 11/19/2022 1801 Last data filed at 11/19/2022 1048 Gross per 24 hour  Intake 1357.39 ml  Output 1300 ml  Net 57.39 ml   Filed  Weights   11/15/22 1751  Weight: 119.3 kg    Examination:  General exam: alert, awake, communicative,calm, NAD Respiratory system: Clear to auscultation. Respiratory effort normal. Cardiovascular system:  RRR.  Gastrointestinal system: Abdomen is nondistended, soft and nontender.  Normal bowel sounds heard. Central nervous system: Alert and oriented.  Weakness in lower extremities , decreased sensation in lower extremities reported chronic  extremities: Right arm /hand edema is decreasing , PICC line in right arm ( not used) Skin: No rashes, lesions or ulcers Psychiatry: Judgement and insight appear normal. Mood & affect appropriate.     Data Reviewed: I have personally reviewed  labs and visualized  imaging studies since the last encounter and formulate the plan        Scheduled Meds:  atenolol  50 mg Oral Daily   Chlorhexidine Gluconate Cloth  6 each Topical Daily   finasteride  5 mg Oral Daily   gabapentin  900 mg Oral QHS   hydrALAZINE  10 mg Oral Daily   polyethylene glycol  17 g Oral Daily   senna-docusate  1 tablet Oral BID   tamsulosin  0.8 mg Oral Daily   Continuous Infusions:   ceFAZolin (ANCEF) IV 2 g (11/19/22 1700)   heparin 1,700 Units/hr (11/19/22 1317)     LOS: 3 days   Time spent: 60mns  FFlorencia Reasons MD PhD FACP [facility]  Available via Epic secure chat 7am-7pm for nonurgent issues Please page for urgent issues To page the attending provider between 7A-7P or the covering provider during after hours 7P-7A, please log into the web site [email or web address] and access using universal [place] password for that web site. If you do not have the password, please call the hospital operator.    11/19/2022, 6:01 PM

## 2022-11-19 NOTE — Progress Notes (Signed)
ANTICOAGULATION CONSULT NOTE - follow up  Pharmacy Consult for Heparin Indication: DVT  Allergies  Allergen Reactions   Amlodipine Other (See Comments)    Unknown reaction    Codeine Nausea And Vomiting   Lisinopril Cough    Patient Measurements: Height: 5' 11"$  (180.3 cm) Weight: 119.3 kg (263 lb) IBW/kg (Calculated) : 75.3 Heparin Dosing Weight: 101 kg  Vital Signs: Temp: 97.5 F (36.4 C) (02/18 0627) Temp Source: Oral (02/18 0627) BP: 120/65 (02/18 0627) Pulse Rate: 59 (02/18 0627)  Labs: Recent Labs    11/17/22 0557 11/18/22 0542 11/19/22 0523  HGB 13.1 12.3* 12.7*  HCT 37.7* 36.9* 39.3  PLT 108* 120* 122*  HEPARINUNFRC 0.53 0.47 0.50  CREATININE  --  0.79 0.70     Estimated Creatinine Clearance: 112.9 mL/min (by C-G formula based on SCr of 0.7 mg/dL).  Assessment: 72 yo male presents with R arm swelling at PICC site.  Pharmacy consulted to dose IV heparin for +DVT.  US doppler showed acute deep vein thrombosis involving the right internal jugular vein, right subclavian vein, right axillary vein and right brachial veins.   11/19/2022 Heparin level is therapeutic at 0.5 CBC:  Hgb low/stable at 12.7, Plt up to 122, low/stable No bleeding or line issues reported per RN   Goal of Therapy:  Heparin level 0.3-0.7 units/ml Monitor platelets by anticoagulation protocol: Yes   Plan:  Continue heparin drip at 1700 units/hr Daily CBC & heparin level F/u for transition to long-term anticoagulation  Gretta Arab PharmD, BCPS [facility] main pharmacy [phone] 11/19/2022 7:13 AM

## 2022-11-19 NOTE — Progress Notes (Signed)
Upon dressing change, it was found that the PICC line was pulled out to 6 cm, flushes but no blood return.  Erlinda Hong MD and [facility] NP notified by secure chat.  See Erlinda Hong MD 2/18 progress note for plan.

## 2022-11-19 NOTE — Progress Notes (Signed)
Referring Physician(s): Dr. Erlinda Hong  Supervising Physician: Juliet Rude  Patient Status:  [facility] - In-pt  Chief Complaint: RIJ DVT/PICC line associated  HPI: Patient with a history of severe lumbar spinal stenosis s/p multiple surgery, HTN, BPH, asthma, Hep C and recent hospitalization (1/29-2/2) for lumbar discitis. He was discharged home with PICC and long term IV antibiotics (until 12/11/22). He presented to the [facility] ED 11/15/22 with right arm swelling. Duplex imaging showed an acute DVT involving the right IJ, right subclavian, right axillary and right brachial veins. He was placed on a heparin infusion.   IR was consulted 11/15/22 for possible thrombectomy/intervention. Dr. Serafina Royals reviewed the patient's imaging and recommended continued conservative management with heparin for a few more days.   The patient continues to have right arm swelling but it has decreased a noticeable amount. IR asked to re-evaluate patient.  Subjective: Patient awake/alert in bed. He states the swelling in his right arm has gone down a fair amount.   Allergies: Amlodipine, Codeine, and Lisinopril  Medications: Prior to Admission medications   Medication Sig Start Date End Date Taking? Authorizing Provider  acetaminophen (TYLENOL) 500 MG tablet Take 1 tablet (500 mg total) by mouth every 6 (six) hours as needed for moderate pain. Patient taking differently: Take 1,000 mg by mouth every 8 (eight) hours as needed for moderate pain. 11/03/22  Yes Dwyane Dee, MD  atenolol (TENORMIN) 50 MG tablet Take 50 mg by mouth daily.    Yes [provider]  ceFAZolin (ANCEF) IVPB Inject 2 g into the vein every 8 (eight) hours. Indication:  osteomyelitis First Dose: Yes Last Day of Therapy:  12/11/22- Pull PICC at completion of IV abx  Labs - Once weekly:  CBC/D and BMP, Labs - Every other week:  ESR and CRP Method of administration: IV Push Method of administration may be changed at the discretion of home  infusion pharmacist based upon assessment of the patient and/or caregiver's ability to self-administer the medication ordered. 11/03/22 12/11/22 Yes Dwyane Dee, MD  Cholecalciferol 50 MCG (2000 UT) TABS Take 2,000 Units by mouth daily.    Yes [provider]  DULCOLAX 10 MG suppository Place 10 mg rectally daily as needed for constipation. 05/10/22  Yes [provider]  finasteride (PROSCAR) 5 MG tablet Take 5 mg by mouth daily.   Yes [provider]  gabapentin (NEURONTIN) 300 MG capsule Take 900 mg by mouth at bedtime.   Yes [provider]  hydrALAZINE (APRESOLINE) 10 MG tablet Take 10 mg by mouth daily.   Yes [provider]  hydrochlorothiazide (HYDRODIURIL) 25 MG tablet Take 25 mg by mouth daily.    Yes [provider]  magnesium hydroxide (MILK OF MAGNESIA) 400 MG/5ML suspension Take 30 mLs by mouth daily as needed for mild constipation.   Yes [provider]  methocarbamol (ROBAXIN) 500 MG tablet Take 1-2 tablets (500-1,000 mg total) by mouth every 6 (six) hours as needed for muscle spasms. 05/24/22  Yes McKenzie, Lennie Muckle, PA-C  nystatin-triamcinolone ointment (MYCOLOG) Apply 1 Application topically daily as needed for dry skin. 05/10/22  Yes [provider]  ondansetron (ZOFRAN) 4 MG tablet Take 1 tablet (4 mg total) by mouth every 6 (six) hours as needed for nausea. 05/19/22  Yes McKenzie, Lennie Muckle, PA-C  oxyCODONE-acetaminophen (PERCOCET/ROXICET) 5-325 MG tablet Take 1 tablet by mouth every 6 (six) hours as needed for moderate pain or severe pain. 11/03/22  Yes Dwyane Dee, MD  SENNA LAXATIVE  8.6 MG tablet Take 1 tablet by mouth 2 (two) times daily. Hold for loose stools. 05/10/22  Yes [provider]  tamsulosin (FLOMAX) 0.4 MG CAPS capsule Take 0.8 mg by mouth daily.   Yes [provider]     Vital Signs: BP 118/65   Pulse 66   Temp (!) 97.5 F (36.4 C) (Oral)   Resp 18   Ht 5' 11"$  (1.803 m)   Wt  263 lb (119.3 kg)   SpO2 96%   BMI 36.68 kg/m   Physical Exam Constitutional:      General: He is not in acute distress.    Appearance: He is not ill-appearing.  HENT:     Mouth/Throat:     Mouth: Mucous membranes are moist.     Pharynx: Oropharynx is clear.  Pulmonary:     Effort: Pulmonary effort is normal.  Musculoskeletal:        General: Swelling present.     Comments: Right upper arm swelling. Skin is warm, dry. Patient denies numbness or tingling in this extremity. He has equal strength in both hands.   Skin:    General: Skin is warm and dry.  Neurological:     Mental Status: He is alert and oriented to person, place, and time.    Imaging: DG Shoulder Left Port  Result Date: 11/17/2022 CLINICAL DATA:  Left shoulder pain. EXAM: LEFT SHOULDER COMPARISON:  None Available. FINDINGS: The glenohumeral and AC joints are maintained. No acute fracture or worrisome bone lesion. No abnormal soft tissue calcifications. IMPRESSION: No acute bony findings or significant degenerative changes. Electronically Signed   By: Marijo Sanes M.D.   On: 11/17/2022 15:30   DG Shoulder Right Port  Result Date: 11/17/2022 CLINICAL DATA:  Bilateral shoulder pain EXAM: RIGHT SHOULDER - 3 VIEW COMPARISON:  None Available. FINDINGS: Osteopenia. No fracture or dislocation. Preserved joint spaces. Overlapping cardiac leads. There is a right-sided PICC catheter in place with tip only overlying the subclavian region. Please correlate with clinical history. IMPRESSION: No acute osseous abnormality. Right-sided PICC catheter in place with tip overlying the subclavian region. Please correlate with clinical findings Electronically Signed   By: Jill Side M.D.   On: 11/17/2022 15:29   UE VENOUS DUPLEX (7am - 7pm)  Result Date: 11/17/2022 UPPER VENOUS STUDY  Patient Name:  Evan Morgan  Date of Exam:   11/15/2022 Medical Rec #: [id number]     Accession #:    [id number] Date of Birth: 14-May-1951     Patient Gender: M  Patient Age:   72 years Exam Location:  [facility] Procedure:      VAS [place] UPPER EXTREMITY VENOUS DUPLEX Referring Phys: Theodis Blaze --------------------------------------------------------------------------------  Indications: Pain Other Indications: Right brachial PICC line. Risk Factors: None identified. Limitations: Poor ultrasound/tissue interface and patient positioning. Comparison Study: No prior studies. Performing Technologist: Oliver Hum RVT  Examination Guidelines: A complete evaluation includes B-mode imaging, spectral Doppler, color Doppler, and power Doppler as needed of all accessible portions of each vessel. Bilateral testing is considered an integral part of a complete examination. Limited examinations for reoccurring indications may be performed as noted.  Right Findings: +----------+------------+---------+-----------+----------+-------+ RIGHT     CompressiblePhasicitySpontaneousPropertiesSummary +----------+------------+---------+-----------+----------+-------+ IJV           None       No        No                Acute  +----------+------------+---------+-----------+----------+-------+ Subclavian  None       No        No                Acute  +----------+------------+---------+-----------+----------+-------+ Axillary      None       No        No                Acute  +----------+------------+---------+-----------+----------+-------+ Brachial      None       No        No                Acute  +----------+------------+---------+-----------+----------+-------+ Radial        Full                                          +----------+------------+---------+-----------+----------+-------+ Ulnar         Full                                          +----------+------------+---------+-----------+----------+-------+ Cephalic      Full                                           +----------+------------+---------+-----------+----------+-------+ Basilic       Full                                          +----------+------------+---------+-----------+----------+-------+  Left Findings: +----------+------------+---------+-----------+----------+-------+ LEFT      CompressiblePhasicitySpontaneousPropertiesSummary +----------+------------+---------+-----------+----------+-------+ Subclavian    Full       Yes       Yes                      +----------+------------+---------+-----------+----------+-------+  Summary:  Right: No evidence of superficial vein thrombosis in the upper extremity. Findings consistent with acute deep vein thrombosis involving the right internal jugular vein, right subclavian vein, right axillary vein and right brachial veins.  Left: No evidence of thrombosis in the subclavian.  *See table(s) above for measurements and observations.  Diagnosing physician: Deitra Mayo MD Electronically signed by Deitra Mayo MD on 11/17/2022 at 10:35:38 AM.    Final     Labs:  CBC: Recent Labs    11/16/22 0451 11/17/22 0557 11/18/22 0542 11/19/22 0523  WBC 4.2 3.9* 4.0 3.9*  HGB 12.2* 13.1 12.3* 12.7*  HCT 37.5* 37.7* 36.9* 39.3  PLT 104* 108* 120* 122*    COAGS: Recent Labs    10/30/22 1535 11/15/22 2038  INR 1.1 1.2  APTT  --  41*    BMP: Recent Labs    11/15/22 1858 11/16/22 0451 11/18/22 0542 11/19/22 0523  NA 135 132* 135 135  K 3.4* 4.0 3.8 4.0  CL 101 102 103 103  CO2 28 24 23 23  $ GLUCOSE 100* 97 92 92  BUN 12 11 12 10  $ CALCIUM 8.9 8.5* 9.0 9.3  CREATININE 0.81 0.72 0.79 0.70  GFRNONAA >60 >60 >60 >60    LIVER FUNCTION TESTS: Recent Labs    10/30/22 1535 11/02/22 0307 11/15/22 1858  11/16/22 0451  BILITOT 0.5 0.5 0.5 0.7  AST 21 22 22 19  $ ALT 15 13 6 6  $ ALKPHOS 34* 31* 35* 32*  PROT 7.8 6.9 7.7 7.1  ALBUMIN 3.6 3.1* 3.4* 3.2*    Assessment and Plan:  Acute DVT involving the right IJ, right  subclavian, right axillary and right brachial veins; heparin infusion in place  Imaging and clinical history discussed with Dr. Denna Haggard. The patient endorses a noticeable decrease in the amount of right arm swelling. IR will order a repeat right upper extremity duplex to assess treatment response to heparin and determine if an intervention is warranted.   IR will continue to follow.   Electronically Signed: Soyla Dryer, AGACNP-BC [phone] 11/19/2022, 11:23 AM   I spent a total of 15 Minutes at the the patient's bedside AND on the patient's hospital floor or unit, greater than 50% of which was counseling/coordinating care for right arm DVT.

## 2022-11-20 ENCOUNTER — Inpatient Hospital Stay (HOSPITAL_COMMUNITY): Payer: No Typology Code available for payment source

## 2022-11-20 ENCOUNTER — Inpatient Hospital Stay (HOSPITAL_COMMUNITY): Payer: Medicare PPO

## 2022-11-20 DIAGNOSIS — I82621 Acute embolism and thrombosis of deep veins of right upper extremity: Secondary | ICD-10-CM | POA: Diagnosis not present

## 2022-11-20 DIAGNOSIS — J4520 Mild intermittent asthma, uncomplicated: Secondary | ICD-10-CM | POA: Diagnosis not present

## 2022-11-20 DIAGNOSIS — M4646 Discitis, unspecified, lumbar region: Secondary | ICD-10-CM | POA: Diagnosis not present

## 2022-11-20 DIAGNOSIS — N400 Enlarged prostate without lower urinary tract symptoms: Secondary | ICD-10-CM | POA: Diagnosis not present

## 2022-11-20 DIAGNOSIS — I6521 Occlusion and stenosis of right carotid artery: Secondary | ICD-10-CM | POA: Diagnosis not present

## 2022-11-20 LAB — BASIC METABOLIC PANEL
Anion gap: 9 (ref 5–15)
BUN: 11 mg/dL (ref 8–23)
CO2: 24 mmol/L (ref 22–32)
Calcium: 9.1 mg/dL (ref 8.9–10.3)
Chloride: 101 mmol/L (ref 98–111)
Creatinine, Ser: 0.75 mg/dL (ref 0.61–1.24)
GFR, Estimated: >60 mL/min (ref >60)
Glucose, Bld: 92 mg/dL (ref 70–99)
Potassium: 3.9 mmol/L (ref 3.5–5.1)
Sodium: 134 mmol/L — ABNORMAL LOW (ref 135–145)

## 2022-11-20 LAB — CBC
HCT: 37.9 % — ABNORMAL LOW (ref 39.0–52.0)
Hemoglobin: 12.3 g/dL — ABNORMAL LOW (ref 13.0–17.0)
MCH: 29.3 pg (ref 26.0–34.0)
MCHC: 32.5 g/dL (ref 30.0–36.0)
MCV: 90.2 fL (ref 80.0–100.0)
Platelets: 125 10*3/uL — ABNORMAL LOW (ref 150–400)
RBC: 4.2 MIL/uL — ABNORMAL LOW (ref 4.22–5.81)
RDW: 14.1 % (ref 11.5–15.5)
WBC: 3.7 10*3/uL — ABNORMAL LOW (ref 4.0–10.5)
nRBC: 0 % (ref 0.0–0.2)

## 2022-11-20 LAB — CULTURE, BLOOD (ROUTINE X 2): Culture: NO GROWTH

## 2022-11-20 LAB — HEPARIN LEVEL (UNFRACTIONATED): Heparin Unfractionated: 0.47 IU/mL (ref 0.30–0.70)

## 2022-11-20 NOTE — Progress Notes (Signed)
PROGRESS NOTE    JES JAMAICA  [id number] DOB: 31-Mar-1951 DOA: 11/15/2022 PCP: Charlsie Merles, MD     Brief Narrative:  72 years old male with past medical history of hypertension, asthma, hepatitis C severe lumbar spinal stenosis s/p multiple surgeries bed to wheelchair bound,  BPH, patient admitted to the hospital between January 29 2-2 2024 due to discitis of the lumbar region and was discharged on IV antibiotics to be completed on 12/11/2022 presented to hospital with right arm pain and swelling.  Patient was noted to have acute DVT and was admitted to hospital for further evaluation and treatment.  At this time IR has been consulted after initiating heparin drip due to PICC line associated DVT.   Assessment & Plan:  Principal Problem:   Arm DVT (deep venous thromboembolism), acute, right (HCC) Active Problems:   Discitis of lumbar region   BPH (benign prostatic hyperplasia)   HTN (hypertension)   Asthma, chronic   Hypokalemia   Acute DVT (deep venous thrombosis) (HCC)   Acute RUE DVT/picc line associated  Vascular surgery was consulted and recommend conservative management with anticoagulation.  IR was consulted and plan to repeat venous duplex ultrasound today and decide on exchange versus removal of old PICC line  and replacement on the left upper extremity.  Will follow recommendations.  Discitis of lumbar region Continue Ancef IV., last day on 3/11.  Recent admission for discitis.  Blood cultures negative in 5 days.  Thrombocytopenia Mild chronic thrombocytopenia.  Will continue to monitor closely.  HTN Blood pressure seems to be stable.  BPH Continue finasteride and tamsulosin.  Grade 1 obesity.   Body mass index is 36.68 kg/m.Marland KitchenWould benefit from weight loss as outpatient  chronic bilateral shoulder pain, has been getting steroids from Ortho office as outpatient.  Debility: Reports wheelchair bound, no feeling in lower extremity, reports chronic urinary  incontinence but able to control his bowel, he would like to be lifted out of bed to use bedside commode when having BM   DVT prophylaxis: On heparin drip   Code Status:   Code Status: Full Code  Family Communication:  None at bedside.  Disposition:   Dispo: The patient is from: SNF              Anticipated d/c is to: SNF              Anticipated d/c date is: Likely in 1 to 2 days, repeat ultrasound pending, needs clearance from IR.  Antimicrobials:    Anti-infectives (From admission, onward)    Start     Dose/Rate Route Frequency Ordered Stop   11/16/22 0800  ceFAZolin (ANCEF) IVPB 2g/100 mL premix        2 g 200 mL/hr over 30 Minutes Intravenous Every 8 hours 11/15/22 2328     11/16/22 0015  ceFAZolin (ANCEF) IVPB 2g/100 mL premix        2 g 200 mL/hr over 30 Minutes Intravenous  Once 11/15/22 2321 11/16/22 0100       Subjective: Today,  patient states that his arm feels little better with swelling.  Denies any fever, chills or rigor.  Denies any shortness of breath or chest pain.  Objective: Vitals:   11/19/22 1331 11/19/22 1953 11/20/22 0620 11/20/22 0954  BP: 125/72 (!) 147/73 124/72 110/68  Pulse: 60 60 (!) 59 61  Resp: 18 18 18   $ Temp: 98.1 F (36.7 C) 98.4 F (36.9 C) 97.7 F (36.5 C)   TempSrc:  Oral Oral Oral   SpO2: 99% 100% 98%   Weight:      Height:        Intake/Output Summary (Last 24 hours) at 11/20/2022 1131 Last data filed at 11/20/2022 0500 Gross per 24 hour  Intake 975.02 ml  Output 1400 ml  Net -424.98 ml    Filed Weights   11/15/22 1751  Weight: 119.3 kg    Physical examination: General:  Average built, not in obvious distress HENT:   No scleral pallor or icterus noted. Oral mucosa is moist.  Chest:  Clear breath sounds.  Diminished breath sounds bilaterally. No crackles or wheezes.  CVS: S1 &S2 heard. No murmur.  Regular rate and rhythm. Abdomen: Soft, nontender, nondistended.  Bowel sounds are heard.   Extremities: No cyanosis,  clubbing or edema.  Peripheral pulses are palpable.  Decreased sensation over the lower extremities.  Right upper extremity hand edema, PICC line in place Psych: Alert, awake and oriented, normal mood CNS:  No cranial nerve deficits.  Power equal in all extremities.   Skin: Warm and dry.  No rashes noted.   Data Reviewed: I have personally reviewed  labs and visualized  imaging studies   Scheduled Meds:  atenolol  50 mg Oral Daily   Chlorhexidine Gluconate Cloth  6 each Topical Daily   finasteride  5 mg Oral Daily   gabapentin  900 mg Oral QHS   hydrALAZINE  10 mg Oral Daily   polyethylene glycol  17 g Oral Daily   senna-docusate  1 tablet Oral BID   tamsulosin  0.8 mg Oral Daily   Continuous Infusions:   ceFAZolin (ANCEF) IV 2 g (11/20/22 0840)   heparin 1,700 Units/hr (11/20/22 0500)     LOS: 4 days   Flora Lipps, MD  [facility] 11/20/2022, 11:31 AM

## 2022-11-20 NOTE — Progress Notes (Signed)
Upper extremity venous right study completed.  Preliminary results relayed to Pokhrel, MD.   See CV Proc for preliminary results report.   Darlin Coco, RDMS, RVT

## 2022-11-20 NOTE — Progress Notes (Signed)
ANTICOAGULATION CONSULT NOTE - follow up  Pharmacy Consult for Heparin Indication: DVT  Allergies  Allergen Reactions   Amlodipine Other (See Comments)    Unknown reaction    Codeine Nausea And Vomiting   Lisinopril Cough    Patient Measurements: Height: 5' 11"$  (180.3 cm) Weight: 119.3 kg (263 lb) IBW/kg (Calculated) : 75.3 Heparin Dosing Weight: 101 kg  Vital Signs: Temp: 97.7 F (36.5 C) (02/19 0620) Temp Source: Oral (02/19 0620) BP: 124/72 (02/19 0620) Pulse Rate: 59 (02/19 0620)  Labs: Recent Labs    11/18/22 0542 11/19/22 0523 11/20/22 0459  HGB 12.3* 12.7* 12.3*  HCT 36.9* 39.3 37.9*  PLT 120* 122* 125*  HEPARINUNFRC 0.47 0.50 0.47  CREATININE 0.79 0.70 0.75     Estimated Creatinine Clearance: 112.9 mL/min (by C-G formula based on SCr of 0.75 mg/dL).  Assessment: 72 yo male presents with R arm swelling at PICC site.  Pharmacy consulted to dose IV heparin for +DVT.  US doppler showed acute deep vein thrombosis involving the right internal jugular vein, right subclavian vein, right axillary vein and right brachial veins.   11/20/2022 Heparin level is therapeutic at 0.47 CBC:  Hgb low/stable at 12.3, Plt up to 125 No bleeding or line issues reported per RN   Goal of Therapy:  Heparin level 0.3-0.7 units/ml Monitor platelets by anticoagulation protocol: Yes   Plan:  Continue heparin drip at 1700 units/hr Daily CBC & heparin level F/u for transition to long-term anticoagulation  Gretta Arab PharmD, BCPS [facility] main pharmacy [phone] 11/20/2022 8:01 AM

## 2022-11-21 ENCOUNTER — Ambulatory Visit: Payer: Medicare PPO | Admitting: Infectious Disease

## 2022-11-21 ENCOUNTER — Inpatient Hospital Stay: Payer: Self-pay

## 2022-11-21 DIAGNOSIS — M4646 Discitis, unspecified, lumbar region: Secondary | ICD-10-CM | POA: Diagnosis not present

## 2022-11-21 DIAGNOSIS — J4520 Mild intermittent asthma, uncomplicated: Secondary | ICD-10-CM | POA: Diagnosis not present

## 2022-11-21 DIAGNOSIS — N400 Enlarged prostate without lower urinary tract symptoms: Secondary | ICD-10-CM | POA: Diagnosis not present

## 2022-11-21 DIAGNOSIS — I82621 Acute embolism and thrombosis of deep veins of right upper extremity: Secondary | ICD-10-CM | POA: Diagnosis not present

## 2022-11-21 LAB — CBC
HCT: 38.5 % — ABNORMAL LOW (ref 39.0–52.0)
Hemoglobin: 12.7 g/dL — ABNORMAL LOW (ref 13.0–17.0)
MCH: 29.7 pg (ref 26.0–34.0)
MCHC: 33 g/dL (ref 30.0–36.0)
MCV: 90 fL (ref 80.0–100.0)
Platelets: 132 10*3/uL — ABNORMAL LOW (ref 150–400)
RBC: 4.28 MIL/uL (ref 4.22–5.81)
RDW: 14.1 % (ref 11.5–15.5)
WBC: 4.1 10*3/uL (ref 4.0–10.5)
nRBC: 0 % (ref 0.0–0.2)

## 2022-11-21 LAB — CULTURE, BLOOD (ROUTINE X 2): Culture: NO GROWTH

## 2022-11-21 LAB — HEPARIN LEVEL (UNFRACTIONATED): Heparin Unfractionated: 0.58 IU/mL (ref 0.30–0.70)

## 2022-11-21 MED ORDER — APIXABAN 5 MG PO TABS: 5.0000 mg | ORAL_TABLET | Freq: Two times a day (BID) | ORAL | Status: DC

## 2022-11-21 MED ORDER — APIXABAN 5 MG PO TABS
10.0000 mg | ORAL_TABLET | Freq: Two times a day (BID) | ORAL | Status: DC
  Administered 2022-11-21 – 2022-11-22 (×3): 10 mg via ORAL
  Filled 2022-11-21 (×3): qty 2

## 2022-11-21 MED ORDER — MAGNESIUM HYDROXIDE 400 MG/5ML PO SUSP
30.0000 mL | Freq: Two times a day (BID) | ORAL | Status: AC
  Administered 2022-11-21: 30 mL via ORAL
  Filled 2022-11-21 (×2): qty 30

## 2022-11-21 NOTE — Progress Notes (Signed)
PROGRESS NOTE    Evan Morgan  [id number] DOB: 06/23/1951 DOA: 11/15/2022 PCP: Charlsie Merles, MD     Brief Narrative:  72 years old male with past medical history of hypertension, asthma, hepatitis C severe lumbar spinal stenosis s/p multiple surgeries bed to wheelchair bound,  BPH, patient admitted to the hospital between January 29 2-2 2024 due to discitis of the lumbar region and was discharged on IV antibiotics to be completed on 12/11/2022 presented to hospital with right arm pain and swelling.  Patient was noted to have acute DVT and was admitted to hospital for further evaluation and treatment.  At this time IR has been consulted after initiating heparin drip due to PICC line associated DVT.   Assessment & Plan:  Principal Problem:   Arm DVT (deep venous thromboembolism), acute, right (HCC) Active Problems:   Discitis of lumbar region   BPH (benign prostatic hyperplasia)   HTN (hypertension)   Asthma, chronic   Hypokalemia   Acute DVT (deep venous thrombosis) (HCC)   Acute RUE DVT/picc line associated  Vascular surgery was consulted and recommend conservative management with anticoagulation.  IR was consulted and  repeat venous duplex ultrasound was done and shows some partial cannulization.  IR does not have plan for intervention of the thrombus except for anticoagulation.  At this time PICC line is flushing but no blood return so will order for IR guided PICC line exchange today.    Discitis of lumbar region Continue Ancef IV., last day on 3/11.  Recent admission for discitis.  Blood cultures negative in 5 days.  Thrombocytopenia Mild chronic thrombocytopenia.  Will continue to monitor closely.  HTN Blood pressure seems to be stable.  Continue hydralazine, atenolol.  BPH Continue finasteride and tamsulosin.  Grade 1 obesity.   Body mass index is 36.68 kg/m.Marland KitchenWould benefit from weight loss as outpatient  chronic bilateral shoulder pain, has been getting  steroids from Ortho office as outpatient.  Debility: Reports wheelchair bound, no feeling in lower extremity, reports chronic urinary incontinence but able to control his bowel, he would like to be lifted out of bed to use bedside commode when having BM.  Patient is currently at the [facility].  DVT prophylaxis: On heparin drip will change to Eliquis.  apixaban (ELIQUIS) tablet 10 mg  apixaban (ELIQUIS) tablet 5 mg   Code Status:   Code Status: Full Code  Family Communication:  None at bedside.  Disposition:   Dispo: The patient is from: SNF              Anticipated d/c is to: SNF              Anticipated d/c date is: Likely likely by 11/22/2022.  Will need IR guided PICC line exchange.    Antimicrobials:    Anti-infectives (From admission, onward)    Start     Dose/Rate Route Frequency Ordered Stop   11/16/22 0800  ceFAZolin (ANCEF) IVPB 2g/100 mL premix        2 g 200 mL/hr over 30 Minutes Intravenous Every 8 hours 11/15/22 2328     11/16/22 0015  ceFAZolin (ANCEF) IVPB 2g/100 mL premix        2 g 200 mL/hr over 30 Minutes Intravenous  Once 11/15/22 2321 11/16/22 0100      Subjective: Today, patient was seen and examined at bedside states that his swelling is little better has some pain in the arm.  No shortness of breath cough or  chest pain.   Objective: Vitals:   11/20/22 0620 11/20/22 0954 11/20/22 1300 11/21/22 0532  BP: 124/72 110/68 115/69 119/75  Pulse: (!) 59 61 64 (!) 59  Resp: 18  18 17  $ Temp: 97.7 F (36.5 C)  97.9 F (36.6 C) 97.6 F (36.4 C)  TempSrc: Oral  Oral Oral  SpO2: 98%  100% 98%  Weight:      Height:        Intake/Output Summary (Last 24 hours) at 11/21/2022 1119 Last data filed at 11/21/2022 0500 Gross per 24 hour  Intake --  Output 850 ml  Net -850 ml    Filed Weights   11/15/22 1751  Weight: 119.3 kg    Physical examination: Body mass index is 36.68 kg/m.  General: Obese built, not in obvious  distress HENT:   No scleral pallor or icterus noted. Oral mucosa is moist.  Chest:  Clear breath sounds.  Diminished breath sounds bilaterally. No crackles or wheezes.  CVS: S1 &S2 heard. No murmur.  Regular rate and rhythm. Abdomen: Soft, nontender, nondistended.  Bowel sounds are heard.   Extremities: No cyanosis, clubbing or edema.  Peripheral pulses are palpable.  Decreased sensation over the lower extremities.  Right upper extremity with PICC line in place with edema.   Psych: Alert, awake and oriented, normal mood CNS:  No cranial nerve deficits.  Power equal in all extremities.   Skin: Warm and dry.  No rashes noted.   Data Reviewed: I have personally reviewed  labs and visualized  imaging studies   Scheduled Meds:  apixaban  10 mg Oral BID   Followed by   Derrill Memo ON 11/28/2022] apixaban  5 mg Oral BID   atenolol  50 mg Oral Daily   Chlorhexidine Gluconate Cloth  6 each Topical Daily   finasteride  5 mg Oral Daily   gabapentin  900 mg Oral QHS   hydrALAZINE  10 mg Oral Daily   magnesium hydroxide  30 mL Oral BID   polyethylene glycol  17 g Oral Daily   senna-docusate  1 tablet Oral BID   tamsulosin  0.8 mg Oral Daily   Continuous Infusions:   ceFAZolin (ANCEF) IV 2 g (11/21/22 0821)     LOS: 5 days   Flora Lipps, MD  [facility] 11/21/2022, 11:19 AM

## 2022-11-21 NOTE — Progress Notes (Signed)
Occupational Therapy Treatment Patient Details Name: Evan Morgan MRN: [id number] DOB: 11-Jan-1951 Today's Date: 11/21/2022   History of present illness Pt is 72 yo male admitted 11/15/22 with R UE DVT with heparin initiated.  Pt with severe lumbar spinal stenosis s/p multiple surgeries (last sx was 7/23)  and was in the hospital from 1/29 to 11/25/22 due to discitis of lumbar region was discharged on IV antibiotics. Pt with hx including multiple back surgeries, HTN, BPH,  mild intermittent asthma, h/o hepc,   OT comments  Patient is making progress towards goals. Patient was able to engage in therapeutic activity sitting EOB to maintain functional activity tolerance. Patient's discharge plan remains appropriate at this time. OT will continue to follow acutely.     Recommendations for follow up therapy are one component of a multi-disciplinary discharge planning process, led by the attending physician.  Recommendations may be updated based on patient status, additional functional criteria and insurance authorization.    Follow Up Recommendations  Skilled nursing-short term rehab (<3 hours/day)     Assistance Recommended at Discharge Frequent or constant Supervision/Assistance  Patient can return home with the following  Two people to help with walking and/or transfers;A lot of help with bathing/dressing/bathroom;Assistance with cooking/housework;Help with stairs or ramp for entrance;Assist for transportation   Equipment Recommendations  None recommended by OT       Precautions / Restrictions Precautions Precautions: Fall;Back Precaution Comments: back for comfort Restrictions Weight Bearing Restrictions: No              ADL either performed or assessed with clinical judgement   ADL Overall ADL's : Needs assistance/impaired     Grooming: Wash/dry face;Set up;Sitting Grooming Details (indicate cue type and reason): EOB, declined to brush teeth or complete other ADLs reporting  that he already completed them today            Extremity/Trunk Assessment Upper Extremity Assessment Upper Extremity Assessment: RUE deficits/detail;LUE deficits/detail RUE Deficits / Details: gross shoulder movements with RUE able to abduct about 60 degrees. very painful with movemnets able to move UE functionally within pain levels.             Cognition Arousal/Alertness: Awake/alert Behavior During Therapy: WFL for tasks assessed/performed Overall Cognitive Status: Within Functional Limits for tasks assessed            Exercises Other Exercises Other Exercises: patient was educated on gentle ROM of BUE and neck to prevent getting stiff and maintaining ROM. patient verbalized and demonstrated understanding. Other Exercises: patient participated in scapualr movements sitting EOB to maitnain shoulder movements with increased cues with R scapula more moble than L.       General Comments UE exercises with OT at EOB    Pertinent Vitals/ Pain       Pain Assessment Pain Assessment: 0-10 Pain Score: 8  Pain Location: Bilateral shoulders - arthritic and overuse Pain Descriptors / Indicators: Grimacing, Aching Pain Intervention(s): Limited activity within patient's tolerance, Monitored during session         Frequency  Min 2X/week        Progress Toward Goals  OT Goals(current goals can now be found in the care plan section)  Progress towards OT goals: Progressing toward goals     Plan Discharge plan remains appropriate    Co-evaluation      Reason for Co-Treatment: For patient/therapist safety;To address functional/ADL transfers          AM-PAC OT "6 Clicks" Daily Activity  Outcome Measure   Help from another person eating meals?: None Help from another person taking care of personal grooming?: None Help from another person toileting, which includes using toliet, bedpan, or urinal?: Total Help from another person bathing (including washing,  rinsing, drying)?: A Lot Help from another person to put on and taking off regular upper body clothing?: A Lot Help from another person to put on and taking off regular lower body clothing?: Total 6 Click Score: 14    End of Session    OT Visit Diagnosis: Muscle weakness (generalized) (M62.81)   Activity Tolerance Patient tolerated treatment well   Patient Left in bed;with call bell/phone within reach;with bed alarm set   Nurse Communication Mobility status        Time: [id number] OT Time Calculation (min): 34 min  Charges: OT General Charges $OT Visit: 1 Visit OT Treatments $Therapeutic Activity: 8-22 mins  Rennie Plowman, MS Acute Rehabilitation Department Office# [phone]   Willa Rough 11/21/2022, 4:34 PM

## 2022-11-21 NOTE — Progress Notes (Signed)
Physical Therapy Treatment Patient Details Name: Evan Morgan MRN: [id number] DOB: [birth year] Today's Date: 11/21/2022   History of Present Illness Pt is 72 yo male admitted 11/15/22 with R UE DVT with heparin initiated.  Pt with severe lumbar spinal stenosis s/p multiple surgeries (last sx was 7/23)  and was in the hospital from 1/29 to 11/25/22 due to discitis of lumbar region was discharged on IV antibiotics. Pt with hx including multiple back surgeries, HTN, BPH,  mild intermittent asthma, h/o hepc,    PT Comments    Pt with limited progress due to shoulder pain.  With pt's injuries/condition he is largely dependent on shoulder and arm strength.  Not able to do lateral transfers due to should pain.  Did sit EOB several minutes with good improvements in trunk control. Continue plan of care as able.   Pt had BM and cleaned during session, but still felt he needed to do more.  Notified NT that pt on bed pan.     Recommendations for follow up therapy are one component of a multi-disciplinary discharge planning process, led by the attending physician.  Recommendations may be updated based on patient status, additional functional criteria and insurance authorization.  Follow Up Recommendations  Skilled nursing-short term rehab (<3 hours/day) Can patient physically be transported by private vehicle: No   Assistance Recommended at Discharge Intermittent Supervision/Assistance  Patient can return home with the following Two people to help with walking and/or transfers;Two people to help with bathing/dressing/bathroom   Equipment Recommendations  None recommended by PT    Recommendations for Other Services       Precautions / Restrictions Precautions Precautions: Fall;Back Precaution Comments: back for comfort Restrictions Weight Bearing Restrictions: No     Mobility  Bed Mobility Overal bed mobility: Needs Assistance Bed Mobility: Rolling, Supine to Sit, Sit to Supine Rolling: Max  assist   Supine to sit: Mod assist, +2 for physical assistance Sit to supine: Mod assist, +2 for physical assistance   General bed mobility comments: Tried sidelying to sit but pt shifting trunk and ended up being supine/sit.  He was able to slide legs toward edge of bed but requiring assist for trunk and to scoot forward.  Pt attempted to use momentum to return to supine but required assist for legs and turning.  Max A for rolling for legs and initiatting but pt able to assist once able to reach rails    Transfers                   General transfer comment: deferred - not safe for standing ; attempted lateral scoot but shoulders too painful    Ambulation/Gait                   Stairs             Wheelchair Mobility    Modified Rankin (Stroke Patients Only)       Balance Overall balance assessment: Needs assistance Sitting-balance support: Feet supported, No upper extremity supported Sitting balance-Leahy Scale: Fair Sitting balance - Comments: Pt able to sit EOB for 10-15 mins during session.  Pt initially with posterior lean requiring mod/max A and asking to be pushed forward, but once settled EOB had pt place arms on chair in front and he was able to balance.  Then able to work hands back to lap.  Worked on weight shifting R/L 5x2 and A/P 5x2 with guarding       Standing balance comment:  deferred                            Cognition Arousal/Alertness: Awake/alert Behavior During Therapy: WFL for tasks assessed/performed Overall Cognitive Status: Within Functional Limits for tasks assessed                                          Exercises      General Comments General comments (skin integrity, edema, etc.): UE exercises with OT at EOB      Pertinent Vitals/Pain Pain Assessment Pain Assessment: 0-10 Pain Score: 8  Pain Location: Bilateral shoulders - arthritic and overuse Pain Descriptors / Indicators:  Grimacing, Aching Pain Intervention(s): Limited activity within patient's tolerance, Monitored during session    Home Living                          Prior Function            PT Goals (current goals can now be found in the care plan section) Progress towards PT goals: Progressing toward goals    Frequency    Min 2X/week      PT Plan Current plan remains appropriate    Co-evaluation PT/OT/SLP Co-Evaluation/Treatment: Yes Reason for Co-Treatment: For patient/therapist safety;To address functional/ADL transfers          AM-PAC PT "6 Clicks" Mobility   Outcome Measure  Help needed turning from your back to your side while in a flat bed without using bedrails?: Total Help needed moving from lying on your back to sitting on the side of a flat bed without using bedrails?: Total Help needed moving to and from a bed to a chair (including a wheelchair)?: Total Help needed standing up from a chair using your arms (e.g., wheelchair or bedside chair)?: Total Help needed to walk in hospital room?: Total Help needed climbing 3-5 steps with a railing? : Total 6 Click Score: 6    End of Session Equipment Utilized During Treatment: Gait belt Activity Tolerance: Patient limited by fatigue Patient left: in bed;with bed alarm set;with call bell/phone within reach;Other (comment) (on bed pan; NT notified) Nurse Communication: Mobility status;Need for lift equipment PT Visit Diagnosis: Other abnormalities of gait and mobility (R26.89);Other symptoms and signs involving the nervous system (R29.898)     Time: [id number] PT Time Calculation (min) (ACUTE ONLY): 33 min  Charges:  $Therapeutic Activity: 8-22 mins                     Abran Richard, PT Acute Rehab [facility] [phone]    Karlton Lemon 11/21/2022, 2:47 PM

## 2022-11-21 NOTE — Progress Notes (Addendum)
ANTICOAGULATION CONSULT NOTE - follow up  Pharmacy Consult for Heparin, Apixaban  Indication: DVT  Allergies  Allergen Reactions   Amlodipine Other (See Comments)    Unknown reaction    Codeine Nausea And Vomiting   Lisinopril Cough    Patient Measurements: Height: 5' 11"$  (180.3 cm) Weight: 119.3 kg (263 lb) IBW/kg (Calculated) : 75.3 Heparin Dosing Weight: 101 kg  Vital Signs: Temp: 97.6 F (36.4 C) (02/20 0532) Temp Source: Oral (02/20 0532) BP: 119/75 (02/20 0532) Pulse Rate: 59 (02/20 0532)  Labs: Recent Labs    11/19/22 0523 11/20/22 0459 11/21/22 0558  HGB 12.7* 12.3* 12.7*  HCT 39.3 37.9* 38.5*  PLT 122* 125* 132*  HEPARINUNFRC 0.50 0.47 0.58  CREATININE 0.70 0.75  --      Estimated Creatinine Clearance: 112.9 mL/min (by C-G formula based on SCr of 0.75 mg/dL).  Assessment: 72 yo male presents with R arm swelling at PICC site.  Pharmacy was initially consulted to dose IV heparin for RUE DVT.    2/14 RUE duplex: acute DVT involving the right IJ vein, right subclavian vein, right axillary vein and right brachial veins.  2/19 RUE Duplex: acute DVT involving the right IJ vein, right subclavian vein, right axillary vein and right brachial veins. Incidental Finding: Total occlusion of the proximal ICA.    11/21/2022 Heparin level is therapeutic at 0.58 CBC:  Hgb low/stable at 12.7, Plt up to 132 No bleeding or line issues reported per RN   Goal of Therapy:  Heparin level 0.3-0.7 units/ml Monitor platelets by anticoagulation protocol: Yes   Plan:  Continue heparin drip at 1700 units/hr Daily CBC & heparin level F/u IR / procedural plans F/u for transition to long-term anticoagulation  Gretta Arab PharmD, BCPS [facility] main pharmacy [phone] 11/21/2022 7:04 AM    Addendum: Pharmacy is now consulted to transition from heparin to apixaban prior to discharge. Apixaban 54m PO BID x7 days, followed by 57mPO BID Pharmacy to provide coupon and education  prior to discharge.    ChGretta ArabharmD, BCPS [facility] main pharmacy [phone] 7694/20/2024 10:45 AM

## 2022-11-21 NOTE — Discharge Instructions (Signed)
Information on my medicine - ELIQUIS (apixaban)  Why was Eliquis prescribed for you? Eliquis was prescribed to treat blood clots that may have been found in the veins of your legs (deep vein thrombosis) or in your lungs (pulmonary embolism) and to reduce the risk of them occurring again.  What do You need to know about Eliquis ? The starting dose is 10 mg (two 5 mg tablets) taken TWICE daily for the FIRST SEVEN (7) DAYS, then on 11/28/22  the dose is reduced to ONE 5 mg tablet taken TWICE daily.  Eliquis may be taken with or without food.   Try to take the dose about the same time in the morning and in the evening. If you have difficulty swallowing the tablet whole please discuss with your pharmacist how to take the medication safely.  Take Eliquis exactly as prescribed and DO NOT stop taking Eliquis without talking to the doctor who prescribed the medication.  Stopping may increase your risk of developing a new blood clot.  Refill your prescription before you run out.  After discharge, you should have regular check-up appointments with your healthcare provider that is prescribing your Eliquis.    What do you do if you miss a dose? If a dose of ELIQUIS is not taken at the scheduled time, take it as soon as possible on the same day and twice-daily administration should be resumed. The dose should not be doubled to make up for a missed dose.  Important Safety Information A possible side effect of Eliquis is bleeding. You should call your healthcare provider right away if you experience any of the following: Bleeding from an injury or your nose that does not stop. Unusual colored urine (red or dark brown) or unusual colored stools (red or black). Unusual bruising for unknown reasons. A serious fall or if you hit your head (even if there is no bleeding).  Some medicines may interact with Eliquis and might increase your risk of bleeding or clotting while on Eliquis. To help avoid this,  consult your healthcare provider or pharmacist prior to using any new prescription or non-prescription medications, including herbals, vitamins, non-steroidal anti-inflammatory drugs (NSAIDs) and supplements.  This website has more information on Eliquis (apixaban): http://www.eliquis.com/eliquis/home

## 2022-11-22 ENCOUNTER — Inpatient Hospital Stay: Payer: Self-pay

## 2022-11-22 DIAGNOSIS — N400 Enlarged prostate without lower urinary tract symptoms: Secondary | ICD-10-CM | POA: Diagnosis not present

## 2022-11-22 DIAGNOSIS — J4520 Mild intermittent asthma, uncomplicated: Secondary | ICD-10-CM | POA: Diagnosis not present

## 2022-11-22 DIAGNOSIS — I82621 Acute embolism and thrombosis of deep veins of right upper extremity: Secondary | ICD-10-CM | POA: Diagnosis not present

## 2022-11-22 DIAGNOSIS — M4646 Discitis, unspecified, lumbar region: Secondary | ICD-10-CM | POA: Diagnosis not present

## 2022-11-22 LAB — CBC
HCT: 40.4 % (ref 39.0–52.0)
Hemoglobin: 13.1 g/dL (ref 13.0–17.0)
MCH: 29.6 pg (ref 26.0–34.0)
MCHC: 32.4 g/dL (ref 30.0–36.0)
MCV: 91.2 fL (ref 80.0–100.0)
Platelets: 135 10*3/uL — ABNORMAL LOW (ref 150–400)
RBC: 4.43 MIL/uL (ref 4.22–5.81)
RDW: 14 % (ref 11.5–15.5)
WBC: 3.3 10*3/uL — ABNORMAL LOW (ref 4.0–10.5)
nRBC: 0 % (ref 0.0–0.2)

## 2022-11-22 MED ORDER — APIXABAN (ELIQUIS) VTE STARTER PACK (10MG AND 5MG): ORAL_TABLET | ORAL | 2 refills | Status: AC

## 2022-11-22 MED ORDER — SODIUM CHLORIDE 0.9% FLUSH: 10.0000 mL | INTRAVENOUS | Status: DC | PRN

## 2022-11-22 MED ORDER — SODIUM CHLORIDE 0.9% FLUSH
10.0000 mL | Freq: Two times a day (BID) | INTRAVENOUS | Status: DC
  Administered 2022-11-22: 10 mL

## 2022-11-22 NOTE — Progress Notes (Signed)
Carotid duplex bilateral study completed.  Confirmed TOTAL RIGHT ICA OCCLUSION. Preliminary results relayed to Pokhrel, MD.   See CV Proc for preliminary results report.   Darlin Coco, RDMS, RVT

## 2022-11-22 NOTE — Plan of Care (Signed)

## 2022-11-22 NOTE — Progress Notes (Signed)
Pt. Discharged via stretcher, PTAR came to bring him to SNF. Pt. Is alert and oriented, no distress. AVS attached to the discharged packet. All patient belongings are with the patient.

## 2022-11-22 NOTE — TOC Transition Note (Signed)
Transition of Care [facility]) - CM/SW Discharge Note   Patient Details  Name: Evan Morgan MRN: [id number] Date of Birth: 1951-09-17  Transition of Care [facility]) CM/SW Contact:  Vassie Moselle, LCSW Phone Number: 11/22/2022, 1:26 PM   Clinical Narrative:    Pt is to return to [facility] for SNF placement. CSW confirmed discharge plan with pt and left voicemail for pt's sister to inform of discharge plans. Pt will be going to room 212. RN to call report to [phone]. [facility] to be called for transportation.    Final next level of care: Skilled Nursing Facility Barriers to Discharge: Barriers Resolved   Patient Goals and CMS Choice CMS Medicare.gov Compare Post Acute Care list provided to:: Patient Choice offered to / list presented to : Patient, Adult Children  Discharge Placement                    Name of family member notified: Patient Patient and family notified of of transfer: 11/22/22  Discharge Plan and Services Additional resources added to the After Visit Summary for   In-house Referral: Clinical Social Work Discharge Planning Services: NA Post Acute Care Choice: [place]          DME Arranged: N/A DME Agency: NA                  Social Determinants of Health (SDOH) Interventions SDOH Screenings   Food Insecurity: No Food Insecurity (11/16/2022)  Housing: Low Risk  (11/16/2022)  Transportation Needs: No Transportation Needs (11/16/2022)  Utilities: Not At Risk (11/16/2022)  Tobacco Use: Low Risk  (11/15/2022)     Readmission Risk Interventions    11/22/2022    1:22 PM  Readmission Risk Prevention Plan  Transportation Screening Complete  PCP or Specialist Appt within 5-7 Days Complete  Home Care Screening Complete  Medication Review (RN CM) Complete

## 2022-11-22 NOTE — Discharge Summary (Signed)
Physician Discharge Summary  Evan Morgan [id number] DOB: 09-28-1951 DOA: 11/15/2022  PCP: Charlsie Merles, MD  Admit date: 11/15/2022 Discharge date: 11/22/2022  Admitted From: Skilled nursing facility  Discharge disposition: Skilled nursing facility   Recommendations for Outpatient Follow-Up:   Follow up with your primary care provider at the skilled nursing facility in 3 to 5 days Will need weekly CBC with differential, BMP, CRP and ESR.  Will need to follow-up with infectious disease clinic. Continue IV antibiotics through the PICC line. Keep right arm elevated when possible to reduce edema. Patient has been started on Eliquis for catheter induced DVT.  Recommendation to continue for 3 months.   Discharge Diagnosis:   Principal Problem:   Arm DVT (deep venous thromboembolism), acute, right (HCC) Active Problems:   Discitis of lumbar region   BPH (benign prostatic hyperplasia)   HTN (hypertension)   Asthma, chronic   Hypokalemia   Acute DVT (deep venous thrombosis) ([facility])   Discharge Condition: Improved.  Diet recommendation: Low sodium, heart healthy.   Wound care: None.  Code status: Full.   History of Present Illness:   72 years old male with past medical history of hypertension, asthma, hepatitis C severe lumbar spinal stenosis s/p multiple surgeries bed to wheelchair bound, BPH, patient admitted to the hospital between January 29 2-2 2024 due to discitis of the lumbar region and was discharged on IV antibiotics to be completed on 12/11/2022 presented to hospital with right arm pain and swelling. Patient was noted to have acute DVT and was admitted to hospital for further evaluation and treatment.   Hospital Course:   Following conditions were addressed during hospitalization as listed below,  Acute RUE DVT/picc line associated  Initial PICC line was placed on 11/01/2022.  Vascular surgery was consulted and recommend conservative management with  anticoagulation.  IR was consulted and  repeat venous duplex ultrasound was done and shows some partial cannulization.  IR recommended to discontinue the catheter since it was not having blood return.  New PICC was placed on the left upper extremity for continuation of IV antibiotic on discharge.  Patient was on heparin drip during hospitalization which has been transitioned to Eliquis at this time.   Discitis of lumbar region Continue Ancef IV., last day on 3/11.    Blood cultures negative in 5 days.  New PICC line has been placed in on the left upper extremity for antibiotics.  Patient will need to follow-up with ID as outpatient.  Will need weekly CBC with differential BMP CRP and ESR.   Thrombocytopenia Mild chronic thrombocytopenia.  Monitor as outpatient.   HTN Blood pressure seems to be stable.  Continue hydralazine, atenolol.   BPH Continue finasteride and tamsulosin.   Grade 1 obesity.   Body mass index is 36.68 kg/m.Marland KitchenWould benefit from weight loss as outpatient  Disposition.  At this time, patient is stable for disposition to skilled nursing facility with outpatient ID follow-up.  Spoke with the patient's daughter prior to disposition.  Medical Consultants:   Interventional radiology Vascular surgery Procedures:    Left upper extremity PICC line insertion on 11/22/2022 Right upper extremity PICC line removal on 11/21/2022 Subjective:   Today, patient was seen and examined at bedside.  Denies any shortness of breath, chest pain, fever, chills or rigor.  Has mild right arm discomfort and edema.  Discharge Exam:   Vitals:   11/21/22 1952 11/22/22 0419  BP: 120/70 (!) 142/66  Pulse: (!) 59 (!) 58  Resp:  19 18  Temp: 97.9 F (36.6 C) 98.6 F (37 C)  SpO2: 98% 99%   Vitals:   11/20/22 1300 11/21/22 0532 11/21/22 1952 11/22/22 0419  BP: 115/69 119/75 120/70 (!) 142/66  Pulse: 64 (!) 59 (!) 59 (!) 58  Resp: 18 17 19 18  $ Temp: 97.9 F (36.6 C) 97.6 F (36.4 C) 97.9  F (36.6 C) 98.6 F (37 C)  TempSrc: Oral Oral  Oral  SpO2: 100% 98% 98% 99%  Weight:      Height:       General: Alert awake, not in obvious distress HENT: pupils equally reacting to light,  No scleral pallor or icterus noted. Oral mucosa is moist.  Chest:  Clear breath sounds.  Diminished breath sounds bilaterally. No crackles or wheezes.  CVS: S1 &S2 heard. No murmur.  Regular rate and rhythm. Abdomen: Soft, nontender, nondistended.  Bowel sounds are heard.   Extremities: No cyanosis, clubbing  Peripheral pulses are palpable.  Right upper extremity with mild edema. Psych: Alert, awake and oriented, normal mood CNS:  No cranial nerve deficits.   Skin: Warm and dry.   The results of significant diagnostics from this hospitalization (including imaging, microbiology, ancillary and laboratory) are listed below for reference.     Diagnostic Studies:   UE VENOUS DUPLEX (7am - 7pm)  Result Date: 11/17/2022 UPPER VENOUS STUDY  Patient Name:  Evan Morgan  Date of Exam:   11/15/2022 Medical Rec #: [id number]     Accession #:    [id number] Date of Birth: 06/28/1951     Patient Gender: M Patient Age:   65 years [facility] Procedure:      VAS [place] UPPER EXTREMITY VENOUS DUPLEX Referring Phys: Theodis Blaze --------------------------------------------------------------------------------  Indications: Pain Other Indications: Right brachial PICC line. Risk Factors: None identified. Limitations: Poor ultrasound/tissue interface and patient positioning. Comparison Study: No prior studies. Performing Technologist: Oliver Hum RVT  Examination Guidelines: A complete evaluation includes B-mode imaging, spectral Doppler, color Doppler, and power Doppler as needed of all accessible portions of each vessel. Bilateral testing is considered an integral part of a complete examination. Limited examinations for reoccurring indications may be performed as noted.  Right Findings:  +----------+------------+---------+-----------+----------+-------+ RIGHT     CompressiblePhasicitySpontaneousPropertiesSummary +----------+------------+---------+-----------+----------+-------+ IJV           None       No        No                Acute  +----------+------------+---------+-----------+----------+-------+ Subclavian    None       No        No                Acute  +----------+------------+---------+-----------+----------+-------+ Axillary      None       No        No                Acute  +----------+------------+---------+-----------+----------+-------+ Brachial      None       No        No                Acute  +----------+------------+---------+-----------+----------+-------+ Radial        Full                                          +----------+------------+---------+-----------+----------+-------+  Ulnar         Full                                          +----------+------------+---------+-----------+----------+-------+ Cephalic      Full                                          +----------+------------+---------+-----------+----------+-------+ Basilic       Full                                          +----------+------------+---------+-----------+----------+-------+  Left Findings: +----------+------------+---------+-----------+----------+-------+ LEFT      CompressiblePhasicitySpontaneousPropertiesSummary +----------+------------+---------+-----------+----------+-------+ Subclavian    Full       Yes       Yes                      +----------+------------+---------+-----------+----------+-------+  Summary:  Right: No evidence of superficial vein thrombosis in the upper extremity. Findings consistent with acute deep vein thrombosis involving the right internal jugular vein, right subclavian vein, right axillary vein and right brachial veins.  Left: No evidence of thrombosis in the subclavian.  *See table(s) above for  measurements and observations.  Diagnosing physician: Deitra Mayo MD Electronically signed by Deitra Mayo MD on 11/17/2022 at 10:35:38 AM.    Final      Labs:   Basic Metabolic Panel: Recent Labs  Lab 11/15/22 1858 11/16/22 0451 11/18/22 0542 11/19/22 0523 11/20/22 0459  NA 135 132* 135 135 134*  K 3.4* 4.0 3.8 4.0 3.9  CL 101 102 103 103 101  CO2 28 24 23 23 24  $ GLUCOSE 100* 97 92 92 92  BUN 12 11 12 10 11  $ CREATININE 0.81 0.72 0.79 0.70 0.75  CALCIUM 8.9 8.5* 9.0 9.3 9.1  MG 1.6* 1.9  --   --   --   PHOS 3.4 3.4  --   --   --    GFR Estimated Creatinine Clearance: 112.9 mL/min (by C-G formula based on SCr of 0.75 mg/dL). Liver Function Tests: Recent Labs  Lab 11/15/22 1858 11/16/22 0451  AST 22 19  ALT 6 6  ALKPHOS 35* 32*  BILITOT 0.5 0.7  PROT 7.7 7.1  ALBUMIN 3.4* 3.2*   No results for input(s): "LIPASE", "AMYLASE" in the last 168 hours. No results for input(s): "AMMONIA" in the last 168 hours. Coagulation profile Recent Labs  Lab 11/15/22 2038  INR 1.2    CBC: Recent Labs  Lab 11/15/22 1858 11/16/22 0451 11/18/22 0542 11/19/22 0523 11/20/22 0459 11/21/22 0558 11/22/22 0521  WBC 4.3   < > 4.0 3.9* 3.7* 4.1 3.3*  NEUTROABS 2.0  --   --   --   --   --   --   HGB 13.2   < > 12.3* 12.7* 12.3* 12.7* 13.1  HCT 40.4   < > 36.9* 39.3 37.9* 38.5* 40.4  MCV 90.2   < > 89.3 91.0 90.2 90.0 91.2  PLT 104*   < > 120* 122* 125* 132* 135*   < > = values in this interval not displayed.   Cardiac Enzymes: Recent Labs  Lab 11/15/22 1858  CKTOTAL 75  BNP: Invalid input(s): "POCBNP" CBG: No results for input(s): "GLUCAP" in the last 168 hours. D-Dimer No results for input(s): "DDIMER" in the last 72 hours. Hgb A1c No results for input(s): "HGBA1C" in the last 72 hours. Lipid Profile No results for input(s): "CHOL", "HDL", "LDLCALC", "TRIG", "CHOLHDL", "LDLDIRECT" in the last 72 hours. Thyroid function studies No results for input(s):  "TSH", "T4TOTAL", "T3FREE", "THYROIDAB" in the last 72 hours.  Invalid input(s): "FREET3" Anemia work up No results for input(s): "VITAMINB12", "FOLATE", "FERRITIN", "TIBC", "IRON", "RETICCTPCT" in the last 72 hours. Microbiology Recent Results (from the past 240 hour(s))  Culture, blood (routine x 2)     Status: None   Collection Time: 11/15/22  6:50 PM   Specimen: BLOOD  Result Value Ref Range Status   Specimen Description   Final    BLOOD LEFT ANTECUBITAL Performed at [facility] [address] [place], [place] [address]    Special Requests   Final    BOTTLES DRAWN AEROBIC AND ANAEROBIC Blood Culture results may not be optimal due to an inadequate volume of blood received in culture bottles Performed at [facility] [address] [place], [place] [address]    Culture   Final    NO GROWTH 5 DAYS Performed at [facility], [place] [address] [place], [place] [address]    Report Status 11/20/2022 FINAL  Final  Culture, blood (routine x 2)     Status: None   Collection Time: 11/15/22  8:38 PM   Specimen: BLOOD  Result Value Ref Range Status   Specimen Description   Final    BLOOD SITE NOT SPECIFIED Performed at [facility] [address] [place], [place] [address]    Special Requests   Final    BOTTLES DRAWN AEROBIC AND ANAEROBIC Blood Culture results may not be optimal due to an inadequate volume of blood received in culture bottles Performed at [facility] [address] [place], [place] [address]    Culture   Final    NO GROWTH 5 DAYS Performed at [facility], [place] [address] [place], [place] [address]    Report Status 11/21/2022 FINAL  Final     Discharge Instructions:   Discharge Instructions     Diet - low sodium heart healthy   Complete by: As directed    Discharge instructions   Complete by: As directed    Follow-up with your primary care provider in 1 week.   Check blood work at that time.  Continue to take blood thinners as prescribed.    Keep the right arm elevated when possible.  Seek medical attention for worsening symptoms.   Increase activity slowly   Complete by: As directed    No wound care   Complete by: As directed       Allergies as of 11/22/2022       Reactions   Amlodipine Other (See Comments)   Unknown reaction    Codeine Nausea And Vomiting   Lisinopril Cough        Medication List     TAKE these medications    acetaminophen 500 MG tablet Commonly known as: TYLENOL Take 1 tablet (500 mg total) by mouth every 6 (six) hours as needed for moderate pain. What changed:  how much to take when to take this   Apixaban Starter Pack (44m and 514m Commonly known as: ELIQUIS STARTER PACK Take as directed on package: start with  two-89m tablets twice daily for 7 days. On day 8, switch to one-584mtablet twice daily. Continue for 3 months   atenolol 50 MG tablet Commonly known as: TENORMIN Take 50 mg by mouth daily.   ceFAZolin  IVPB Commonly known as: ANCEF Inject 2 g into the vein every 8 (eight) hours. Indication:  osteomyelitis First Dose: Yes Last Day of Therapy:  12/11/22- Pull PICC at completion of IV abx  Labs - Once weekly:  CBC/D and BMP, Labs - Every other week:  ESR and CRP Method of administration: IV Push Method of administration may be changed at the discretion of home infusion pharmacist based upon assessment of the patient and/or caregiver's ability to self-administer the medication ordered.   Cholecalciferol 50 MCG (2000 UT) Tabs Take 2,000 Units by mouth daily.   Dulcolax 10 MG suppository Generic drug: bisacodyl Place 10 mg rectally daily as needed for constipation.   finasteride 5 MG tablet Commonly known as: PROSCAR Take 5 mg by mouth daily.   gabapentin 300 MG capsule Commonly known as: NEURONTIN Take 900 mg by mouth at bedtime.   hydrALAZINE 10 MG tablet Commonly known as:  APRESOLINE Take 10 mg by mouth daily.   hydrochlorothiazide 25 MG tablet Commonly known as: HYDRODIURIL Take 25 mg by mouth daily.   magnesium hydroxide 400 MG/5ML suspension Commonly known as: MILK OF MAGNESIA Take 30 mLs by mouth daily as needed for mild constipation.   methocarbamol 500 MG tablet Commonly known as: ROBAXIN Take 1-2 tablets (500-1,000 mg total) by mouth every 6 (six) hours as needed for muscle spasms.   nystatin-triamcinolone ointment Commonly known as: MYCOLOG Apply 1 Application topically daily as needed for dry skin.   ondansetron 4 MG tablet Commonly known as: ZOFRAN Take 1 tablet (4 mg total) by mouth every 6 (six) hours as needed for nausea.   oxyCODONE-acetaminophen 5-325 MG tablet Commonly known as: PERCOCET/ROXICET Take 1 tablet by mouth every 6 (six) hours as needed for moderate pain or severe pain.   Senna Laxative 8.6 MG tablet Generic drug: senna Take 1 tablet by mouth 2 (two) times daily. Hold for loose stools.   tamsulosin 0.4 MG Caps capsule Commonly known as: FLOMAX Take 0.8 mg by mouth daily.          Time coordinating discharge: 39 minutes  Signed:  Cletus Paris  [facility] 11/22/2022, 11:38 AM

## 2022-11-22 NOTE — Progress Notes (Signed)
Peripherally Inserted Central Catheter Placement  The IV Nurse has discussed with the patient and/or persons authorized to consent for the patient, the purpose of this procedure and the potential benefits and risks involved with this procedure.  The benefits include less needle sticks, lab draws from the catheter, and the patient may be discharged home with the catheter. Risks include, but not limited to, infection, bleeding, blood clot (thrombus formation), and puncture of an artery; nerve damage and irregular heartbeat and possibility to perform a PICC exchange if needed/ordered by physician.  Alternatives to this procedure were also discussed.  Bard Power PICC patient education guide, fact sheet on infection prevention and patient information card has been provided to patient /or left at bedside.    PICC Placement Documentation  PICC Single Lumen 99991111 Left Basilic 48 cm 1 cm (Active)  Indication for Insertion or Continuance of Line Prolonged intravenous therapies 11/22/22 1109  Exposed Catheter (cm) 1 cm 11/22/22 1109  Site Assessment Clean, Dry, Intact 11/22/22 1109  Line Status Flushed;Saline locked;Blood return noted 11/22/22 1109  Dressing Type Transparent;Securing device 11/22/22 1109  Dressing Status Antimicrobial disc in place 11/22/22 1109  Safety Lock Not Applicable [id number]  Line Care Connections checked and tightened 11/22/22 1109  Line Adjustment (NICU/IV Team Only) No 11/22/22 1109  Dressing Intervention New dressing 11/22/22 1109  Dressing Change Due 11/29/22 11/22/22 [place] 11/22/2022, 11:11 AM

## 2022-11-29 ENCOUNTER — Ambulatory Visit (INDEPENDENT_AMBULATORY_CARE_PROVIDER_SITE_OTHER): Payer: Medicare PPO | Admitting: Internal Medicine

## 2022-11-29 ENCOUNTER — Other Ambulatory Visit: Payer: Self-pay

## 2022-11-29 ENCOUNTER — Encounter: Payer: Self-pay | Admitting: Internal Medicine

## 2022-11-29 VITALS — BP 95/61 | HR 56 | Temp 98.2°F

## 2022-11-29 DIAGNOSIS — M4620 Osteomyelitis of vertebra, site unspecified: Secondary | ICD-10-CM

## 2022-11-29 DIAGNOSIS — T847XXD Infection and inflammatory reaction due to other internal orthopedic prosthetic devices, implants and grafts, subsequent encounter: Secondary | ICD-10-CM

## 2022-11-29 NOTE — Progress Notes (Addendum)
[facility]ious Disease  Patient Active Problem List   Diagnosis Date Noted   Acute DVT (deep venous thrombosis) (HCC) 11/16/2022   Arm DVT (deep venous thromboembolism), acute, right ([place]) 11/15/2022   Asthma, chronic 11/15/2022   Hypokalemia 11/15/2022   Staphylococcus infection 11/03/2022   Hepatitis C antibody positive in blood 11/02/2022   Vertebral osteomyelitis ([place]) 10/31/2022   Urinary incontinence [id number]   Hardware complicating wound infection ([place]) 10/31/2022   Discitis 10/30/2022   Discitis of lumbar region 10/30/2022   Post-op pain 05/13/2022   Intractable low back pain 05/13/2022   Radiculopathy 07/23/2019   Abnormal gait 09/03/2017   Bilateral leg numbness 09/03/2017   Muscle spasm 11/07/2016   BPH (benign prostatic hyperplasia) 08/30/2016   GERD (gastroesophageal reflux disease) 08/30/2016   HTN (hypertension) 08/30/2016   S/P lumbar fusion 08/27/2015   DDD (degenerative disc disease), lumbar 04/01/2015   Foraminal stenosis of lumbar region 04/01/2015   Spinal fusion failure 04/01/2015   Midline low back pain with sciatica 12/16/2014      Subjective:    Patient ID: Marylu Lund, male    DOB: [birth year], 72 y.o.   MRN: [id number]  Chief Complaint  Patient presents with   Hospitalization Follow-up    Discitis     HPI:  JAMALL NIVISON is a 72 y.o. male here for hospital f/u of hardware lumbar om, and recent picc associated dvt rue  He has left picc now On cefazolin planned 6 weeks till 3/11 then oral abx suppression Staph capitis on spinal ir biopsy  No side effect from cefazolin Lives in snf  No complaint today No n/v/diarrhea/rash Appetite is good  Pain baseline 8/10   Allergies  Allergen Reactions   Amlodipine Other (See Comments)    Unknown reaction    Codeine Nausea And Vomiting   Lisinopril Cough      Outpatient Medications Prior to Visit  Medication Sig Dispense Refill   acetaminophen (TYLENOL)  500 MG tablet Take 1 tablet (500 mg total) by mouth every 6 (six) hours as needed for moderate pain. (Patient taking differently: Take 1,000 mg by mouth every 8 (eight) hours as needed for moderate pain.) 30 tablet 0   APIXABAN (ELIQUIS) VTE STARTER PACK ('10MG'$  AND '5MG'$ ) Take as directed on package: start with two-'5mg'$  tablets twice daily for 7 days. On day 8, switch to one-'5mg'$  tablet twice daily. Continue for 3 months 1 each 2   ceFAZolin (ANCEF) IVPB Inject 2 g into the vein every 8 (eight) hours. Indication:  osteomyelitis First Dose: Yes Last Day of Therapy:  12/11/22- Pull PICC at completion of IV abx  Labs - Once weekly:  CBC/D and BMP, Labs - Every other week:  ESR and CRP Method of administration: IV Push Method of administration may be changed at the discretion of home infusion pharmacist based upon assessment of the patient and/or caregiver's ability to self-administer the medication ordered. 114 Units 0   Cholecalciferol 50 MCG (2000 UT) TABS Take 2,000 Units by mouth daily.      DULCOLAX 10 MG suppository Place 10 mg rectally daily as needed for constipation.     finasteride (PROSCAR) 5 MG tablet Take 5 mg by mouth daily.     gabapentin (NEURONTIN) 300 MG capsule Take 900 mg by mouth at bedtime.     hydrALAZINE (APRESOLINE) 10 MG tablet Take 10 mg by mouth daily.     hydrochlorothiazide (HYDRODIURIL) 25 MG tablet Take 25  mg by mouth daily.      magnesium hydroxide (MILK OF MAGNESIA) 400 MG/5ML suspension Take 30 mLs by mouth daily as needed for mild constipation.     methocarbamol (ROBAXIN) 500 MG tablet Take 1-2 tablets (500-1,000 mg total) by mouth every 6 (six) hours as needed for muscle spasms. 30 tablet 2   nystatin-triamcinolone ointment (MYCOLOG) Apply 1 Application topically daily as needed for dry skin.     ondansetron (ZOFRAN) 4 MG tablet Take 1 tablet (4 mg total) by mouth every 6 (six) hours as needed for nausea. 20 tablet 0   oxyCODONE-acetaminophen (PERCOCET/ROXICET) 5-325 MG  tablet Take 1 tablet by mouth every 6 (six) hours as needed for moderate pain or severe pain. 20 tablet 0   SENNA LAXATIVE 8.6 MG tablet Take 1 tablet by mouth 2 (two) times daily. Hold for loose stools.     tamsulosin (FLOMAX) 0.4 MG CAPS capsule Take 0.8 mg by mouth daily.     atenolol (TENORMIN) 50 MG tablet Take 50 mg by mouth daily.      No facility-administered medications prior to visit.     Social History   Socioeconomic History   Marital status: Single    Spouse name: Not on file   Number of children: 2   Years of education: Not on file   Highest education level: Not on file  Occupational History   Not on file  Tobacco Use   Smoking status: Never   Smokeless tobacco: Never  Vaping Use   Vaping Use: Never used  Substance and Sexual Activity   Alcohol use: Not Currently    Comment: maybe a fifth per week, not all of the time   Drug use: Yes    Frequency: 4.0 times per week    Types: Marijuana    Comment: 3-4 times per week   Sexual activity: Not on file  Other Topics Concern   Not on file  Social History Narrative   Not on file   Social Determinants of Health   Financial Resource Strain: Not on file  Food Insecurity: No Food Insecurity (11/16/2022)   Hunger Vital Sign    Worried About Running Out of Food in the Last Year: Never true    Ran Out of Food in the Last Year: Never true  Transportation Needs: No Transportation Needs (11/16/2022)   PRAPARE - Hydrologist (Medical): No    Lack of Transportation (Non-Medical): No  Physical Activity: Not on file  Stress: Not on file  Social Connections: Not on file  Intimate Partner Violence: Not At Risk (11/16/2022)   Humiliation, Afraid, Rape, and Kick questionnaire    Fear of Current or Ex-Partner: No    Emotionally Abused: No    Physically Abused: No    Sexually Abused: No      Review of Systems     Objective:    BP 95/61   Pulse (!) 56   Temp 98.2 F (36.8 C) (Oral)    SpO2 100%  Nursing note and vital signs reviewed.  Physical Exam     General/constitutional: no distress, pleasant HEENT: Normocephalic, PER, Conj Clear, EOMI, Oropharynx clear Neck supple CV: rrr no mrg Lungs: clear to auscultation, normal respiratory effort Abd: Soft, Nontender Ext: trace bilateral LE edema chronic Skin: No Rash Neuro: nonfocal outside of bilateral functional paraparesis; in wheel chair MSK: no peripheral joint swelling/tenderness/warmth; back spines nontender   Central line presence: left upper ext picc site dressing coming  off; no purulence/tenderness   Labs: Lab Results  Component Value Date   WBC 3.3 (L) 11/22/2022   HGB 13.1 11/22/2022   HCT 40.4 11/22/2022   MCV 91.2 11/22/2022   PLT 135 (L) [id number]   Last metabolic panel Lab Results  Component Value Date   GLUCOSE 92 11/20/2022   NA 134 (L) 11/20/2022   K 3.9 11/20/2022   CL 101 11/20/2022   CO2 24 11/20/2022   BUN 11 11/20/2022   CREATININE 0.75 11/20/2022   GFRNONAA >60 11/20/2022   CALCIUM 9.1 11/20/2022   PHOS 3.4 11/16/2022   PROT 7.1 11/16/2022   ALBUMIN 3.2 (L) 11/16/2022   BILITOT 0.7 11/16/2022   ALKPHOS 32 (L) 11/16/2022   AST 19 11/16/2022   ALT 6 11/16/2022   ANIONGAP 9 11/20/2022       Component Value Date/Time   CRP 0.7 10/31/2022 1410   CRP 6.3 (H) 05/13/2022 0907    Micro:  Serology:  Imaging:  Assessment & Plan:   Problem List Items Addressed This Visit       Musculoskeletal and Integument   Vertebral osteomyelitis ([place]) - Primary     Other   Hardware complicating wound infection ([place])      No orders of the defined types were placed in this encounter.  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 He had less pain than when he was admitted 10/30/22.  He was readmitted 2/14-2/21/24 for dvt in the right upper ext due to picc. Picc was changed to LUE.   No rifampin due to no compelling evidence of benefit    11/29/22 id assessment Tolerate cefazolin Has chronic thrombocytopenia and intermittent leukopenia. Will keep eyes on it. He also has early cirrhosis  On eliquis for dvt -- will need to f/u pcp to have stop date for this Will transition to cefadroxil on 12/12/2022 for at least 6-12 months after that if not indefinitely  Ongoing weekly cbc, cmp, cpr needed from SNF F/u with id clinic in 4 weeks   I have spent a total of 40 minutes of face-to-face and non-face-to-face time, excluding clinical staff time, preparing to see patient, ordering tests and/or medications, and provide counseling the patient    Follow-up: Return in about 4 weeks (around 12/27/2022).      Jabier Mutton, [facility] 11/29/2022, 2:05 PM

## 2022-11-29 NOTE — Patient Instructions (Signed)
Will change cefazolin to cefadroxil on 12/12/22  Picc to be removed on 3/11  Please come see [place] again 4 weeks from today

## 2022-12-05 LAB — LAB REPORT - SCANNED: EGFR: 90

## 2022-12-19 ENCOUNTER — Other Ambulatory Visit: Payer: Self-pay | Admitting: Internal Medicine

## 2022-12-19 NOTE — Progress Notes (Signed)
Received labs from snf   12/12/2022  [place] [facility]er N&R   No cbc; cr 0.73; lft wnl Crp (hs)  0.86 (<0.5)

## 2022-12-25 ENCOUNTER — Ambulatory Visit: Payer: Medicare PPO | Admitting: Internal Medicine

## 2023-01-12 DIAGNOSIS — M4646 Discitis, unspecified, lumbar region: Secondary | ICD-10-CM | POA: Diagnosis not present

## 2023-01-12 DIAGNOSIS — I10 Essential (primary) hypertension: Secondary | ICD-10-CM | POA: Diagnosis not present

## 2023-01-12 DIAGNOSIS — Z7901 Long term (current) use of anticoagulants: Secondary | ICD-10-CM | POA: Diagnosis not present

## 2023-01-12 DIAGNOSIS — I82621 Acute embolism and thrombosis of deep veins of right upper extremity: Secondary | ICD-10-CM | POA: Diagnosis not present

## 2023-01-12 DIAGNOSIS — J45909 Unspecified asthma, uncomplicated: Secondary | ICD-10-CM | POA: Diagnosis not present

## 2023-01-13 DIAGNOSIS — K5900 Constipation, unspecified: Secondary | ICD-10-CM | POA: Diagnosis not present

## 2023-01-13 DIAGNOSIS — N400 Enlarged prostate without lower urinary tract symptoms: Secondary | ICD-10-CM | POA: Diagnosis not present

## 2023-01-13 DIAGNOSIS — M150 Primary generalized (osteo)arthritis: Secondary | ICD-10-CM | POA: Diagnosis not present

## 2023-01-13 DIAGNOSIS — J45909 Unspecified asthma, uncomplicated: Secondary | ICD-10-CM | POA: Diagnosis not present

## 2023-01-13 DIAGNOSIS — I10 Essential (primary) hypertension: Secondary | ICD-10-CM | POA: Diagnosis not present

## 2023-01-13 DIAGNOSIS — M4646 Discitis, unspecified, lumbar region: Secondary | ICD-10-CM | POA: Diagnosis not present

## 2023-01-13 DIAGNOSIS — Z7901 Long term (current) use of anticoagulants: Secondary | ICD-10-CM | POA: Diagnosis not present

## 2023-01-13 DIAGNOSIS — G629 Polyneuropathy, unspecified: Secondary | ICD-10-CM | POA: Diagnosis not present

## 2023-01-13 DIAGNOSIS — I82621 Acute embolism and thrombosis of deep veins of right upper extremity: Secondary | ICD-10-CM | POA: Diagnosis not present

## 2023-01-17 DIAGNOSIS — N39498 Other specified urinary incontinence: Secondary | ICD-10-CM | POA: Diagnosis not present

## 2023-01-17 DIAGNOSIS — M48061 Spinal stenosis, lumbar region without neurogenic claudication: Secondary | ICD-10-CM | POA: Diagnosis not present

## 2023-01-17 DIAGNOSIS — I10 Essential (primary) hypertension: Secondary | ICD-10-CM | POA: Diagnosis not present

## 2023-01-17 DIAGNOSIS — E669 Obesity, unspecified: Secondary | ICD-10-CM | POA: Diagnosis not present

## 2023-01-17 DIAGNOSIS — J45909 Unspecified asthma, uncomplicated: Secondary | ICD-10-CM | POA: Diagnosis not present

## 2023-01-17 DIAGNOSIS — M4646 Discitis, unspecified, lumbar region: Secondary | ICD-10-CM | POA: Diagnosis not present

## 2023-01-17 DIAGNOSIS — N401 Enlarged prostate with lower urinary tract symptoms: Secondary | ICD-10-CM | POA: Diagnosis not present

## 2023-01-17 DIAGNOSIS — B182 Chronic viral hepatitis C: Secondary | ICD-10-CM | POA: Diagnosis not present

## 2023-01-17 DIAGNOSIS — D696 Thrombocytopenia, unspecified: Secondary | ICD-10-CM | POA: Diagnosis not present

## 2023-01-22 DIAGNOSIS — B182 Chronic viral hepatitis C: Secondary | ICD-10-CM | POA: Diagnosis not present

## 2023-01-22 DIAGNOSIS — J45909 Unspecified asthma, uncomplicated: Secondary | ICD-10-CM | POA: Diagnosis not present

## 2023-01-22 DIAGNOSIS — N39498 Other specified urinary incontinence: Secondary | ICD-10-CM | POA: Diagnosis not present

## 2023-01-22 DIAGNOSIS — D696 Thrombocytopenia, unspecified: Secondary | ICD-10-CM | POA: Diagnosis not present

## 2023-01-22 DIAGNOSIS — I10 Essential (primary) hypertension: Secondary | ICD-10-CM | POA: Diagnosis not present

## 2023-01-22 DIAGNOSIS — M48061 Spinal stenosis, lumbar region without neurogenic claudication: Secondary | ICD-10-CM | POA: Diagnosis not present

## 2023-01-22 DIAGNOSIS — E669 Obesity, unspecified: Secondary | ICD-10-CM | POA: Diagnosis not present

## 2023-01-22 DIAGNOSIS — M4646 Discitis, unspecified, lumbar region: Secondary | ICD-10-CM | POA: Diagnosis not present

## 2023-01-22 DIAGNOSIS — N401 Enlarged prostate with lower urinary tract symptoms: Secondary | ICD-10-CM | POA: Diagnosis not present

## 2023-01-24 DIAGNOSIS — D696 Thrombocytopenia, unspecified: Secondary | ICD-10-CM | POA: Diagnosis not present

## 2023-01-24 DIAGNOSIS — K219 Gastro-esophageal reflux disease without esophagitis: Secondary | ICD-10-CM | POA: Diagnosis not present

## 2023-01-24 DIAGNOSIS — K5900 Constipation, unspecified: Secondary | ICD-10-CM | POA: Diagnosis not present

## 2023-01-24 DIAGNOSIS — M1990 Unspecified osteoarthritis, unspecified site: Secondary | ICD-10-CM | POA: Diagnosis not present

## 2023-01-24 DIAGNOSIS — N400 Enlarged prostate without lower urinary tract symptoms: Secondary | ICD-10-CM | POA: Diagnosis not present

## 2023-01-24 DIAGNOSIS — R32 Unspecified urinary incontinence: Secondary | ICD-10-CM | POA: Diagnosis not present

## 2023-01-24 DIAGNOSIS — E1151 Type 2 diabetes mellitus with diabetic peripheral angiopathy without gangrene: Secondary | ICD-10-CM | POA: Diagnosis not present

## 2023-01-24 DIAGNOSIS — N39498 Other specified urinary incontinence: Secondary | ICD-10-CM | POA: Diagnosis not present

## 2023-01-24 DIAGNOSIS — E669 Obesity, unspecified: Secondary | ICD-10-CM | POA: Diagnosis not present

## 2023-01-24 DIAGNOSIS — N401 Enlarged prostate with lower urinary tract symptoms: Secondary | ICD-10-CM | POA: Diagnosis not present

## 2023-01-24 DIAGNOSIS — J45909 Unspecified asthma, uncomplicated: Secondary | ICD-10-CM | POA: Diagnosis not present

## 2023-01-24 DIAGNOSIS — E785 Hyperlipidemia, unspecified: Secondary | ICD-10-CM | POA: Diagnosis not present

## 2023-01-24 DIAGNOSIS — M4646 Discitis, unspecified, lumbar region: Secondary | ICD-10-CM | POA: Diagnosis not present

## 2023-01-24 DIAGNOSIS — B182 Chronic viral hepatitis C: Secondary | ICD-10-CM | POA: Diagnosis not present

## 2023-01-24 DIAGNOSIS — I2510 Atherosclerotic heart disease of native coronary artery without angina pectoris: Secondary | ICD-10-CM | POA: Diagnosis not present

## 2023-01-24 DIAGNOSIS — I10 Essential (primary) hypertension: Secondary | ICD-10-CM | POA: Diagnosis not present

## 2023-01-24 DIAGNOSIS — M48061 Spinal stenosis, lumbar region without neurogenic claudication: Secondary | ICD-10-CM | POA: Diagnosis not present

## 2023-01-29 DIAGNOSIS — B182 Chronic viral hepatitis C: Secondary | ICD-10-CM | POA: Diagnosis not present

## 2023-01-29 DIAGNOSIS — I10 Essential (primary) hypertension: Secondary | ICD-10-CM | POA: Diagnosis not present

## 2023-01-29 DIAGNOSIS — J45909 Unspecified asthma, uncomplicated: Secondary | ICD-10-CM | POA: Diagnosis not present

## 2023-01-29 DIAGNOSIS — N39498 Other specified urinary incontinence: Secondary | ICD-10-CM | POA: Diagnosis not present

## 2023-01-29 DIAGNOSIS — E669 Obesity, unspecified: Secondary | ICD-10-CM | POA: Diagnosis not present

## 2023-01-29 DIAGNOSIS — M4646 Discitis, unspecified, lumbar region: Secondary | ICD-10-CM | POA: Diagnosis not present

## 2023-01-29 DIAGNOSIS — N401 Enlarged prostate with lower urinary tract symptoms: Secondary | ICD-10-CM | POA: Diagnosis not present

## 2023-01-29 DIAGNOSIS — D696 Thrombocytopenia, unspecified: Secondary | ICD-10-CM | POA: Diagnosis not present

## 2023-01-29 DIAGNOSIS — M48061 Spinal stenosis, lumbar region without neurogenic claudication: Secondary | ICD-10-CM | POA: Diagnosis not present

## 2023-01-31 DIAGNOSIS — I10 Essential (primary) hypertension: Secondary | ICD-10-CM | POA: Diagnosis not present

## 2023-01-31 DIAGNOSIS — E669 Obesity, unspecified: Secondary | ICD-10-CM | POA: Diagnosis not present

## 2023-01-31 DIAGNOSIS — N39498 Other specified urinary incontinence: Secondary | ICD-10-CM | POA: Diagnosis not present

## 2023-01-31 DIAGNOSIS — B182 Chronic viral hepatitis C: Secondary | ICD-10-CM | POA: Diagnosis not present

## 2023-01-31 DIAGNOSIS — J45909 Unspecified asthma, uncomplicated: Secondary | ICD-10-CM | POA: Diagnosis not present

## 2023-01-31 DIAGNOSIS — M48061 Spinal stenosis, lumbar region without neurogenic claudication: Secondary | ICD-10-CM | POA: Diagnosis not present

## 2023-01-31 DIAGNOSIS — M4646 Discitis, unspecified, lumbar region: Secondary | ICD-10-CM | POA: Diagnosis not present

## 2023-01-31 DIAGNOSIS — N401 Enlarged prostate with lower urinary tract symptoms: Secondary | ICD-10-CM | POA: Diagnosis not present

## 2023-01-31 DIAGNOSIS — D696 Thrombocytopenia, unspecified: Secondary | ICD-10-CM | POA: Diagnosis not present

## 2023-02-05 DIAGNOSIS — J45909 Unspecified asthma, uncomplicated: Secondary | ICD-10-CM | POA: Diagnosis not present

## 2023-02-05 DIAGNOSIS — N401 Enlarged prostate with lower urinary tract symptoms: Secondary | ICD-10-CM | POA: Diagnosis not present

## 2023-02-05 DIAGNOSIS — I10 Essential (primary) hypertension: Secondary | ICD-10-CM | POA: Diagnosis not present

## 2023-02-05 DIAGNOSIS — B182 Chronic viral hepatitis C: Secondary | ICD-10-CM | POA: Diagnosis not present

## 2023-02-05 DIAGNOSIS — E669 Obesity, unspecified: Secondary | ICD-10-CM | POA: Diagnosis not present

## 2023-02-05 DIAGNOSIS — M4646 Discitis, unspecified, lumbar region: Secondary | ICD-10-CM | POA: Diagnosis not present

## 2023-02-05 DIAGNOSIS — M48061 Spinal stenosis, lumbar region without neurogenic claudication: Secondary | ICD-10-CM | POA: Diagnosis not present

## 2023-02-05 DIAGNOSIS — N39498 Other specified urinary incontinence: Secondary | ICD-10-CM | POA: Diagnosis not present

## 2023-02-05 DIAGNOSIS — D696 Thrombocytopenia, unspecified: Secondary | ICD-10-CM | POA: Diagnosis not present

## 2023-02-08 DIAGNOSIS — B182 Chronic viral hepatitis C: Secondary | ICD-10-CM | POA: Diagnosis not present

## 2023-02-08 DIAGNOSIS — J45909 Unspecified asthma, uncomplicated: Secondary | ICD-10-CM | POA: Diagnosis not present

## 2023-02-08 DIAGNOSIS — M48061 Spinal stenosis, lumbar region without neurogenic claudication: Secondary | ICD-10-CM | POA: Diagnosis not present

## 2023-02-08 DIAGNOSIS — E669 Obesity, unspecified: Secondary | ICD-10-CM | POA: Diagnosis not present

## 2023-02-08 DIAGNOSIS — N401 Enlarged prostate with lower urinary tract symptoms: Secondary | ICD-10-CM | POA: Diagnosis not present

## 2023-02-08 DIAGNOSIS — D696 Thrombocytopenia, unspecified: Secondary | ICD-10-CM | POA: Diagnosis not present

## 2023-02-08 DIAGNOSIS — N39498 Other specified urinary incontinence: Secondary | ICD-10-CM | POA: Diagnosis not present

## 2023-02-08 DIAGNOSIS — I10 Essential (primary) hypertension: Secondary | ICD-10-CM | POA: Diagnosis not present

## 2023-02-08 DIAGNOSIS — M4646 Discitis, unspecified, lumbar region: Secondary | ICD-10-CM | POA: Diagnosis not present

## 2023-02-09 DIAGNOSIS — J45909 Unspecified asthma, uncomplicated: Secondary | ICD-10-CM | POA: Diagnosis not present

## 2023-02-09 DIAGNOSIS — N401 Enlarged prostate with lower urinary tract symptoms: Secondary | ICD-10-CM | POA: Diagnosis not present

## 2023-02-09 DIAGNOSIS — M4646 Discitis, unspecified, lumbar region: Secondary | ICD-10-CM | POA: Diagnosis not present

## 2023-02-09 DIAGNOSIS — E669 Obesity, unspecified: Secondary | ICD-10-CM | POA: Diagnosis not present

## 2023-02-09 DIAGNOSIS — D696 Thrombocytopenia, unspecified: Secondary | ICD-10-CM | POA: Diagnosis not present

## 2023-02-09 DIAGNOSIS — I10 Essential (primary) hypertension: Secondary | ICD-10-CM | POA: Diagnosis not present

## 2023-02-09 DIAGNOSIS — M48061 Spinal stenosis, lumbar region without neurogenic claudication: Secondary | ICD-10-CM | POA: Diagnosis not present

## 2023-02-09 DIAGNOSIS — N39498 Other specified urinary incontinence: Secondary | ICD-10-CM | POA: Diagnosis not present

## 2023-02-09 DIAGNOSIS — B182 Chronic viral hepatitis C: Secondary | ICD-10-CM | POA: Diagnosis not present

## 2023-02-13 DIAGNOSIS — E669 Obesity, unspecified: Secondary | ICD-10-CM | POA: Diagnosis not present

## 2023-02-13 DIAGNOSIS — B182 Chronic viral hepatitis C: Secondary | ICD-10-CM | POA: Diagnosis not present

## 2023-02-13 DIAGNOSIS — D696 Thrombocytopenia, unspecified: Secondary | ICD-10-CM | POA: Diagnosis not present

## 2023-02-13 DIAGNOSIS — M48061 Spinal stenosis, lumbar region without neurogenic claudication: Secondary | ICD-10-CM | POA: Diagnosis not present

## 2023-02-13 DIAGNOSIS — M4646 Discitis, unspecified, lumbar region: Secondary | ICD-10-CM | POA: Diagnosis not present

## 2023-02-13 DIAGNOSIS — J45909 Unspecified asthma, uncomplicated: Secondary | ICD-10-CM | POA: Diagnosis not present

## 2023-02-13 DIAGNOSIS — I10 Essential (primary) hypertension: Secondary | ICD-10-CM | POA: Diagnosis not present

## 2023-02-13 DIAGNOSIS — N39498 Other specified urinary incontinence: Secondary | ICD-10-CM | POA: Diagnosis not present

## 2023-02-13 DIAGNOSIS — N401 Enlarged prostate with lower urinary tract symptoms: Secondary | ICD-10-CM | POA: Diagnosis not present

## 2023-02-16 DIAGNOSIS — J45909 Unspecified asthma, uncomplicated: Secondary | ICD-10-CM | POA: Diagnosis not present

## 2023-02-16 DIAGNOSIS — M4646 Discitis, unspecified, lumbar region: Secondary | ICD-10-CM | POA: Diagnosis not present

## 2023-02-16 DIAGNOSIS — I10 Essential (primary) hypertension: Secondary | ICD-10-CM | POA: Diagnosis not present

## 2023-02-16 DIAGNOSIS — D696 Thrombocytopenia, unspecified: Secondary | ICD-10-CM | POA: Diagnosis not present

## 2023-02-16 DIAGNOSIS — M48061 Spinal stenosis, lumbar region without neurogenic claudication: Secondary | ICD-10-CM | POA: Diagnosis not present

## 2023-02-16 DIAGNOSIS — B182 Chronic viral hepatitis C: Secondary | ICD-10-CM | POA: Diagnosis not present

## 2023-02-16 DIAGNOSIS — E669 Obesity, unspecified: Secondary | ICD-10-CM | POA: Diagnosis not present

## 2023-02-16 DIAGNOSIS — N401 Enlarged prostate with lower urinary tract symptoms: Secondary | ICD-10-CM | POA: Diagnosis not present

## 2023-02-16 DIAGNOSIS — N39498 Other specified urinary incontinence: Secondary | ICD-10-CM | POA: Diagnosis not present

## 2023-02-20 DIAGNOSIS — B182 Chronic viral hepatitis C: Secondary | ICD-10-CM | POA: Diagnosis not present

## 2023-02-20 DIAGNOSIS — M4646 Discitis, unspecified, lumbar region: Secondary | ICD-10-CM | POA: Diagnosis not present

## 2023-02-20 DIAGNOSIS — N401 Enlarged prostate with lower urinary tract symptoms: Secondary | ICD-10-CM | POA: Diagnosis not present

## 2023-02-20 DIAGNOSIS — D696 Thrombocytopenia, unspecified: Secondary | ICD-10-CM | POA: Diagnosis not present

## 2023-02-20 DIAGNOSIS — E669 Obesity, unspecified: Secondary | ICD-10-CM | POA: Diagnosis not present

## 2023-02-20 DIAGNOSIS — M48061 Spinal stenosis, lumbar region without neurogenic claudication: Secondary | ICD-10-CM | POA: Diagnosis not present

## 2023-02-20 DIAGNOSIS — I10 Essential (primary) hypertension: Secondary | ICD-10-CM | POA: Diagnosis not present

## 2023-02-20 DIAGNOSIS — N39498 Other specified urinary incontinence: Secondary | ICD-10-CM | POA: Diagnosis not present

## 2023-02-20 DIAGNOSIS — J45909 Unspecified asthma, uncomplicated: Secondary | ICD-10-CM | POA: Diagnosis not present

## 2023-02-21 DIAGNOSIS — M4646 Discitis, unspecified, lumbar region: Secondary | ICD-10-CM | POA: Diagnosis not present

## 2023-02-21 DIAGNOSIS — M48061 Spinal stenosis, lumbar region without neurogenic claudication: Secondary | ICD-10-CM | POA: Diagnosis not present

## 2023-02-21 DIAGNOSIS — B182 Chronic viral hepatitis C: Secondary | ICD-10-CM | POA: Diagnosis not present

## 2023-02-21 DIAGNOSIS — N39498 Other specified urinary incontinence: Secondary | ICD-10-CM | POA: Diagnosis not present

## 2023-02-21 DIAGNOSIS — I10 Essential (primary) hypertension: Secondary | ICD-10-CM | POA: Diagnosis not present

## 2023-02-21 DIAGNOSIS — N401 Enlarged prostate with lower urinary tract symptoms: Secondary | ICD-10-CM | POA: Diagnosis not present

## 2023-02-21 DIAGNOSIS — J45909 Unspecified asthma, uncomplicated: Secondary | ICD-10-CM | POA: Diagnosis not present

## 2023-02-21 DIAGNOSIS — E669 Obesity, unspecified: Secondary | ICD-10-CM | POA: Diagnosis not present

## 2023-02-21 DIAGNOSIS — D696 Thrombocytopenia, unspecified: Secondary | ICD-10-CM | POA: Diagnosis not present

## 2023-02-23 DIAGNOSIS — E669 Obesity, unspecified: Secondary | ICD-10-CM | POA: Diagnosis not present

## 2023-02-23 DIAGNOSIS — B182 Chronic viral hepatitis C: Secondary | ICD-10-CM | POA: Diagnosis not present

## 2023-02-23 DIAGNOSIS — M48061 Spinal stenosis, lumbar region without neurogenic claudication: Secondary | ICD-10-CM | POA: Diagnosis not present

## 2023-02-23 DIAGNOSIS — N401 Enlarged prostate with lower urinary tract symptoms: Secondary | ICD-10-CM | POA: Diagnosis not present

## 2023-02-23 DIAGNOSIS — I10 Essential (primary) hypertension: Secondary | ICD-10-CM | POA: Diagnosis not present

## 2023-02-23 DIAGNOSIS — J45909 Unspecified asthma, uncomplicated: Secondary | ICD-10-CM | POA: Diagnosis not present

## 2023-02-23 DIAGNOSIS — N39498 Other specified urinary incontinence: Secondary | ICD-10-CM | POA: Diagnosis not present

## 2023-02-23 DIAGNOSIS — D696 Thrombocytopenia, unspecified: Secondary | ICD-10-CM | POA: Diagnosis not present

## 2023-02-23 DIAGNOSIS — M4646 Discitis, unspecified, lumbar region: Secondary | ICD-10-CM | POA: Diagnosis not present

## 2023-03-01 DIAGNOSIS — B182 Chronic viral hepatitis C: Secondary | ICD-10-CM | POA: Diagnosis not present

## 2023-03-01 DIAGNOSIS — N401 Enlarged prostate with lower urinary tract symptoms: Secondary | ICD-10-CM | POA: Diagnosis not present

## 2023-03-01 DIAGNOSIS — D696 Thrombocytopenia, unspecified: Secondary | ICD-10-CM | POA: Diagnosis not present

## 2023-03-01 DIAGNOSIS — E669 Obesity, unspecified: Secondary | ICD-10-CM | POA: Diagnosis not present

## 2023-03-01 DIAGNOSIS — N39498 Other specified urinary incontinence: Secondary | ICD-10-CM | POA: Diagnosis not present

## 2023-03-01 DIAGNOSIS — I10 Essential (primary) hypertension: Secondary | ICD-10-CM | POA: Diagnosis not present

## 2023-03-01 DIAGNOSIS — M48061 Spinal stenosis, lumbar region without neurogenic claudication: Secondary | ICD-10-CM | POA: Diagnosis not present

## 2023-03-01 DIAGNOSIS — J45909 Unspecified asthma, uncomplicated: Secondary | ICD-10-CM | POA: Diagnosis not present

## 2023-03-01 DIAGNOSIS — M4646 Discitis, unspecified, lumbar region: Secondary | ICD-10-CM | POA: Diagnosis not present

## 2023-03-07 DIAGNOSIS — J45909 Unspecified asthma, uncomplicated: Secondary | ICD-10-CM | POA: Diagnosis not present

## 2023-03-07 DIAGNOSIS — D696 Thrombocytopenia, unspecified: Secondary | ICD-10-CM | POA: Diagnosis not present

## 2023-03-07 DIAGNOSIS — N39498 Other specified urinary incontinence: Secondary | ICD-10-CM | POA: Diagnosis not present

## 2023-03-07 DIAGNOSIS — E669 Obesity, unspecified: Secondary | ICD-10-CM | POA: Diagnosis not present

## 2023-03-07 DIAGNOSIS — M48061 Spinal stenosis, lumbar region without neurogenic claudication: Secondary | ICD-10-CM | POA: Diagnosis not present

## 2023-03-07 DIAGNOSIS — B182 Chronic viral hepatitis C: Secondary | ICD-10-CM | POA: Diagnosis not present

## 2023-03-07 DIAGNOSIS — M4646 Discitis, unspecified, lumbar region: Secondary | ICD-10-CM | POA: Diagnosis not present

## 2023-03-07 DIAGNOSIS — I10 Essential (primary) hypertension: Secondary | ICD-10-CM | POA: Diagnosis not present

## 2023-03-07 DIAGNOSIS — N401 Enlarged prostate with lower urinary tract symptoms: Secondary | ICD-10-CM | POA: Diagnosis not present

## 2023-03-13 DIAGNOSIS — M4646 Discitis, unspecified, lumbar region: Secondary | ICD-10-CM | POA: Diagnosis not present

## 2023-03-13 DIAGNOSIS — J45909 Unspecified asthma, uncomplicated: Secondary | ICD-10-CM | POA: Diagnosis not present

## 2023-03-13 DIAGNOSIS — M48061 Spinal stenosis, lumbar region without neurogenic claudication: Secondary | ICD-10-CM | POA: Diagnosis not present

## 2023-03-13 DIAGNOSIS — I10 Essential (primary) hypertension: Secondary | ICD-10-CM | POA: Diagnosis not present

## 2023-03-13 DIAGNOSIS — B182 Chronic viral hepatitis C: Secondary | ICD-10-CM | POA: Diagnosis not present

## 2023-03-13 DIAGNOSIS — N39498 Other specified urinary incontinence: Secondary | ICD-10-CM | POA: Diagnosis not present

## 2023-03-13 DIAGNOSIS — E669 Obesity, unspecified: Secondary | ICD-10-CM | POA: Diagnosis not present

## 2023-03-13 DIAGNOSIS — N401 Enlarged prostate with lower urinary tract symptoms: Secondary | ICD-10-CM | POA: Diagnosis not present

## 2023-03-13 DIAGNOSIS — D696 Thrombocytopenia, unspecified: Secondary | ICD-10-CM | POA: Diagnosis not present

## 2023-03-16 DIAGNOSIS — E669 Obesity, unspecified: Secondary | ICD-10-CM | POA: Diagnosis not present

## 2023-03-16 DIAGNOSIS — B182 Chronic viral hepatitis C: Secondary | ICD-10-CM | POA: Diagnosis not present

## 2023-03-16 DIAGNOSIS — N39498 Other specified urinary incontinence: Secondary | ICD-10-CM | POA: Diagnosis not present

## 2023-03-16 DIAGNOSIS — M48061 Spinal stenosis, lumbar region without neurogenic claudication: Secondary | ICD-10-CM | POA: Diagnosis not present

## 2023-03-16 DIAGNOSIS — N401 Enlarged prostate with lower urinary tract symptoms: Secondary | ICD-10-CM | POA: Diagnosis not present

## 2023-03-16 DIAGNOSIS — D696 Thrombocytopenia, unspecified: Secondary | ICD-10-CM | POA: Diagnosis not present

## 2023-03-16 DIAGNOSIS — J45909 Unspecified asthma, uncomplicated: Secondary | ICD-10-CM | POA: Diagnosis not present

## 2023-03-16 DIAGNOSIS — M4646 Discitis, unspecified, lumbar region: Secondary | ICD-10-CM | POA: Diagnosis not present

## 2023-03-16 DIAGNOSIS — I10 Essential (primary) hypertension: Secondary | ICD-10-CM | POA: Diagnosis not present

## 2023-03-17 DIAGNOSIS — M4646 Discitis, unspecified, lumbar region: Secondary | ICD-10-CM | POA: Diagnosis not present

## 2023-03-17 DIAGNOSIS — E669 Obesity, unspecified: Secondary | ICD-10-CM | POA: Diagnosis not present

## 2023-03-17 DIAGNOSIS — D696 Thrombocytopenia, unspecified: Secondary | ICD-10-CM | POA: Diagnosis not present

## 2023-03-17 DIAGNOSIS — M48061 Spinal stenosis, lumbar region without neurogenic claudication: Secondary | ICD-10-CM | POA: Diagnosis not present

## 2023-03-17 DIAGNOSIS — N401 Enlarged prostate with lower urinary tract symptoms: Secondary | ICD-10-CM | POA: Diagnosis not present

## 2023-03-17 DIAGNOSIS — J45909 Unspecified asthma, uncomplicated: Secondary | ICD-10-CM | POA: Diagnosis not present

## 2023-03-17 DIAGNOSIS — B182 Chronic viral hepatitis C: Secondary | ICD-10-CM | POA: Diagnosis not present

## 2023-03-17 DIAGNOSIS — I10 Essential (primary) hypertension: Secondary | ICD-10-CM | POA: Diagnosis not present

## 2023-03-17 DIAGNOSIS — N39498 Other specified urinary incontinence: Secondary | ICD-10-CM | POA: Diagnosis not present

## 2023-03-18 DIAGNOSIS — N39498 Other specified urinary incontinence: Secondary | ICD-10-CM | POA: Diagnosis not present

## 2023-03-18 DIAGNOSIS — J45909 Unspecified asthma, uncomplicated: Secondary | ICD-10-CM | POA: Diagnosis not present

## 2023-03-18 DIAGNOSIS — D696 Thrombocytopenia, unspecified: Secondary | ICD-10-CM | POA: Diagnosis not present

## 2023-03-18 DIAGNOSIS — M48061 Spinal stenosis, lumbar region without neurogenic claudication: Secondary | ICD-10-CM | POA: Diagnosis not present

## 2023-03-18 DIAGNOSIS — I10 Essential (primary) hypertension: Secondary | ICD-10-CM | POA: Diagnosis not present

## 2023-03-18 DIAGNOSIS — B182 Chronic viral hepatitis C: Secondary | ICD-10-CM | POA: Diagnosis not present

## 2023-03-18 DIAGNOSIS — N401 Enlarged prostate with lower urinary tract symptoms: Secondary | ICD-10-CM | POA: Diagnosis not present

## 2023-03-18 DIAGNOSIS — M4646 Discitis, unspecified, lumbar region: Secondary | ICD-10-CM | POA: Diagnosis not present

## 2023-03-18 DIAGNOSIS — E669 Obesity, unspecified: Secondary | ICD-10-CM | POA: Diagnosis not present

## 2023-03-21 DIAGNOSIS — M25512 Pain in left shoulder: Secondary | ICD-10-CM | POA: Diagnosis not present

## 2023-03-21 DIAGNOSIS — M25511 Pain in right shoulder: Secondary | ICD-10-CM | POA: Diagnosis not present

## 2023-04-03 DIAGNOSIS — M4646 Discitis, unspecified, lumbar region: Secondary | ICD-10-CM | POA: Diagnosis not present

## 2023-04-03 DIAGNOSIS — N39498 Other specified urinary incontinence: Secondary | ICD-10-CM | POA: Diagnosis not present

## 2023-04-03 DIAGNOSIS — B182 Chronic viral hepatitis C: Secondary | ICD-10-CM | POA: Diagnosis not present

## 2023-04-03 DIAGNOSIS — N401 Enlarged prostate with lower urinary tract symptoms: Secondary | ICD-10-CM | POA: Diagnosis not present

## 2023-04-03 DIAGNOSIS — J45909 Unspecified asthma, uncomplicated: Secondary | ICD-10-CM | POA: Diagnosis not present

## 2023-04-03 DIAGNOSIS — M48061 Spinal stenosis, lumbar region without neurogenic claudication: Secondary | ICD-10-CM | POA: Diagnosis not present

## 2023-04-03 DIAGNOSIS — I10 Essential (primary) hypertension: Secondary | ICD-10-CM | POA: Diagnosis not present

## 2023-04-03 DIAGNOSIS — E669 Obesity, unspecified: Secondary | ICD-10-CM | POA: Diagnosis not present

## 2023-04-03 DIAGNOSIS — D696 Thrombocytopenia, unspecified: Secondary | ICD-10-CM | POA: Diagnosis not present

## 2023-04-04 DIAGNOSIS — D696 Thrombocytopenia, unspecified: Secondary | ICD-10-CM | POA: Diagnosis not present

## 2023-04-04 DIAGNOSIS — J45909 Unspecified asthma, uncomplicated: Secondary | ICD-10-CM | POA: Diagnosis not present

## 2023-04-04 DIAGNOSIS — M4646 Discitis, unspecified, lumbar region: Secondary | ICD-10-CM | POA: Diagnosis not present

## 2023-04-04 DIAGNOSIS — N401 Enlarged prostate with lower urinary tract symptoms: Secondary | ICD-10-CM | POA: Diagnosis not present

## 2023-04-04 DIAGNOSIS — E669 Obesity, unspecified: Secondary | ICD-10-CM | POA: Diagnosis not present

## 2023-04-04 DIAGNOSIS — B182 Chronic viral hepatitis C: Secondary | ICD-10-CM | POA: Diagnosis not present

## 2023-04-04 DIAGNOSIS — I10 Essential (primary) hypertension: Secondary | ICD-10-CM | POA: Diagnosis not present

## 2023-04-04 DIAGNOSIS — M48061 Spinal stenosis, lumbar region without neurogenic claudication: Secondary | ICD-10-CM | POA: Diagnosis not present

## 2023-04-04 DIAGNOSIS — N39498 Other specified urinary incontinence: Secondary | ICD-10-CM | POA: Diagnosis not present

## 2023-04-11 DIAGNOSIS — N401 Enlarged prostate with lower urinary tract symptoms: Secondary | ICD-10-CM | POA: Diagnosis not present

## 2023-04-11 DIAGNOSIS — E669 Obesity, unspecified: Secondary | ICD-10-CM | POA: Diagnosis not present

## 2023-04-11 DIAGNOSIS — N39498 Other specified urinary incontinence: Secondary | ICD-10-CM | POA: Diagnosis not present

## 2023-04-11 DIAGNOSIS — M48061 Spinal stenosis, lumbar region without neurogenic claudication: Secondary | ICD-10-CM | POA: Diagnosis not present

## 2023-04-11 DIAGNOSIS — I10 Essential (primary) hypertension: Secondary | ICD-10-CM | POA: Diagnosis not present

## 2023-04-11 DIAGNOSIS — J45909 Unspecified asthma, uncomplicated: Secondary | ICD-10-CM | POA: Diagnosis not present

## 2023-04-11 DIAGNOSIS — D696 Thrombocytopenia, unspecified: Secondary | ICD-10-CM | POA: Diagnosis not present

## 2023-04-11 DIAGNOSIS — M4646 Discitis, unspecified, lumbar region: Secondary | ICD-10-CM | POA: Diagnosis not present

## 2023-04-11 DIAGNOSIS — B182 Chronic viral hepatitis C: Secondary | ICD-10-CM | POA: Diagnosis not present

## 2023-04-12 DIAGNOSIS — N39498 Other specified urinary incontinence: Secondary | ICD-10-CM | POA: Diagnosis not present

## 2023-04-12 DIAGNOSIS — I10 Essential (primary) hypertension: Secondary | ICD-10-CM | POA: Diagnosis not present

## 2023-04-12 DIAGNOSIS — D696 Thrombocytopenia, unspecified: Secondary | ICD-10-CM | POA: Diagnosis not present

## 2023-04-12 DIAGNOSIS — N401 Enlarged prostate with lower urinary tract symptoms: Secondary | ICD-10-CM | POA: Diagnosis not present

## 2023-04-12 DIAGNOSIS — M48061 Spinal stenosis, lumbar region without neurogenic claudication: Secondary | ICD-10-CM | POA: Diagnosis not present

## 2023-04-12 DIAGNOSIS — E669 Obesity, unspecified: Secondary | ICD-10-CM | POA: Diagnosis not present

## 2023-04-12 DIAGNOSIS — J45909 Unspecified asthma, uncomplicated: Secondary | ICD-10-CM | POA: Diagnosis not present

## 2023-04-12 DIAGNOSIS — B182 Chronic viral hepatitis C: Secondary | ICD-10-CM | POA: Diagnosis not present

## 2023-04-12 DIAGNOSIS — M4646 Discitis, unspecified, lumbar region: Secondary | ICD-10-CM | POA: Diagnosis not present

## 2023-04-17 DIAGNOSIS — M48061 Spinal stenosis, lumbar region without neurogenic claudication: Secondary | ICD-10-CM | POA: Diagnosis not present

## 2023-04-17 DIAGNOSIS — J45909 Unspecified asthma, uncomplicated: Secondary | ICD-10-CM | POA: Diagnosis not present

## 2023-04-17 DIAGNOSIS — M4646 Discitis, unspecified, lumbar region: Secondary | ICD-10-CM | POA: Diagnosis not present

## 2023-04-17 DIAGNOSIS — N401 Enlarged prostate with lower urinary tract symptoms: Secondary | ICD-10-CM | POA: Diagnosis not present

## 2023-04-17 DIAGNOSIS — I10 Essential (primary) hypertension: Secondary | ICD-10-CM | POA: Diagnosis not present

## 2023-04-17 DIAGNOSIS — N39498 Other specified urinary incontinence: Secondary | ICD-10-CM | POA: Diagnosis not present

## 2023-04-17 DIAGNOSIS — B182 Chronic viral hepatitis C: Secondary | ICD-10-CM | POA: Diagnosis not present

## 2023-04-17 DIAGNOSIS — D696 Thrombocytopenia, unspecified: Secondary | ICD-10-CM | POA: Diagnosis not present

## 2023-04-17 DIAGNOSIS — E669 Obesity, unspecified: Secondary | ICD-10-CM | POA: Diagnosis not present

## 2023-04-18 DIAGNOSIS — I10 Essential (primary) hypertension: Secondary | ICD-10-CM | POA: Diagnosis not present

## 2023-04-18 DIAGNOSIS — M48061 Spinal stenosis, lumbar region without neurogenic claudication: Secondary | ICD-10-CM | POA: Diagnosis not present

## 2023-04-18 DIAGNOSIS — B182 Chronic viral hepatitis C: Secondary | ICD-10-CM | POA: Diagnosis not present

## 2023-04-18 DIAGNOSIS — E669 Obesity, unspecified: Secondary | ICD-10-CM | POA: Diagnosis not present

## 2023-04-18 DIAGNOSIS — M4646 Discitis, unspecified, lumbar region: Secondary | ICD-10-CM | POA: Diagnosis not present

## 2023-04-18 DIAGNOSIS — N39498 Other specified urinary incontinence: Secondary | ICD-10-CM | POA: Diagnosis not present

## 2023-04-18 DIAGNOSIS — J45909 Unspecified asthma, uncomplicated: Secondary | ICD-10-CM | POA: Diagnosis not present

## 2023-04-18 DIAGNOSIS — N401 Enlarged prostate with lower urinary tract symptoms: Secondary | ICD-10-CM | POA: Diagnosis not present

## 2023-04-18 DIAGNOSIS — D696 Thrombocytopenia, unspecified: Secondary | ICD-10-CM | POA: Diagnosis not present

## 2023-04-27 DIAGNOSIS — I10 Essential (primary) hypertension: Secondary | ICD-10-CM | POA: Diagnosis not present

## 2023-04-27 DIAGNOSIS — D696 Thrombocytopenia, unspecified: Secondary | ICD-10-CM | POA: Diagnosis not present

## 2023-04-27 DIAGNOSIS — M4646 Discitis, unspecified, lumbar region: Secondary | ICD-10-CM | POA: Diagnosis not present

## 2023-04-27 DIAGNOSIS — B182 Chronic viral hepatitis C: Secondary | ICD-10-CM | POA: Diagnosis not present

## 2023-04-27 DIAGNOSIS — N401 Enlarged prostate with lower urinary tract symptoms: Secondary | ICD-10-CM | POA: Diagnosis not present

## 2023-04-27 DIAGNOSIS — N39498 Other specified urinary incontinence: Secondary | ICD-10-CM | POA: Diagnosis not present

## 2023-04-27 DIAGNOSIS — E669 Obesity, unspecified: Secondary | ICD-10-CM | POA: Diagnosis not present

## 2023-04-27 DIAGNOSIS — M48061 Spinal stenosis, lumbar region without neurogenic claudication: Secondary | ICD-10-CM | POA: Diagnosis not present

## 2023-04-27 DIAGNOSIS — J45909 Unspecified asthma, uncomplicated: Secondary | ICD-10-CM | POA: Diagnosis not present

## 2023-05-01 DIAGNOSIS — B182 Chronic viral hepatitis C: Secondary | ICD-10-CM | POA: Diagnosis not present

## 2023-05-01 DIAGNOSIS — N401 Enlarged prostate with lower urinary tract symptoms: Secondary | ICD-10-CM | POA: Diagnosis not present

## 2023-05-01 DIAGNOSIS — M48061 Spinal stenosis, lumbar region without neurogenic claudication: Secondary | ICD-10-CM | POA: Diagnosis not present

## 2023-05-01 DIAGNOSIS — N39498 Other specified urinary incontinence: Secondary | ICD-10-CM | POA: Diagnosis not present

## 2023-05-01 DIAGNOSIS — J45909 Unspecified asthma, uncomplicated: Secondary | ICD-10-CM | POA: Diagnosis not present

## 2023-05-01 DIAGNOSIS — M4646 Discitis, unspecified, lumbar region: Secondary | ICD-10-CM | POA: Diagnosis not present

## 2023-05-01 DIAGNOSIS — E669 Obesity, unspecified: Secondary | ICD-10-CM | POA: Diagnosis not present

## 2023-05-01 DIAGNOSIS — I10 Essential (primary) hypertension: Secondary | ICD-10-CM | POA: Diagnosis not present

## 2023-05-01 DIAGNOSIS — D696 Thrombocytopenia, unspecified: Secondary | ICD-10-CM | POA: Diagnosis not present

## 2023-05-03 DIAGNOSIS — I10 Essential (primary) hypertension: Secondary | ICD-10-CM | POA: Diagnosis not present

## 2023-05-03 DIAGNOSIS — E669 Obesity, unspecified: Secondary | ICD-10-CM | POA: Diagnosis not present

## 2023-05-03 DIAGNOSIS — N401 Enlarged prostate with lower urinary tract symptoms: Secondary | ICD-10-CM | POA: Diagnosis not present

## 2023-05-03 DIAGNOSIS — N39498 Other specified urinary incontinence: Secondary | ICD-10-CM | POA: Diagnosis not present

## 2023-05-03 DIAGNOSIS — M48061 Spinal stenosis, lumbar region without neurogenic claudication: Secondary | ICD-10-CM | POA: Diagnosis not present

## 2023-05-03 DIAGNOSIS — M4646 Discitis, unspecified, lumbar region: Secondary | ICD-10-CM | POA: Diagnosis not present

## 2023-05-03 DIAGNOSIS — D696 Thrombocytopenia, unspecified: Secondary | ICD-10-CM | POA: Diagnosis not present

## 2023-05-03 DIAGNOSIS — B182 Chronic viral hepatitis C: Secondary | ICD-10-CM | POA: Diagnosis not present

## 2023-05-03 DIAGNOSIS — J45909 Unspecified asthma, uncomplicated: Secondary | ICD-10-CM | POA: Diagnosis not present

## 2023-05-04 DIAGNOSIS — N39498 Other specified urinary incontinence: Secondary | ICD-10-CM | POA: Diagnosis not present

## 2023-05-04 DIAGNOSIS — M4646 Discitis, unspecified, lumbar region: Secondary | ICD-10-CM | POA: Diagnosis not present

## 2023-05-04 DIAGNOSIS — D696 Thrombocytopenia, unspecified: Secondary | ICD-10-CM | POA: Diagnosis not present

## 2023-05-04 DIAGNOSIS — I10 Essential (primary) hypertension: Secondary | ICD-10-CM | POA: Diagnosis not present

## 2023-05-04 DIAGNOSIS — N401 Enlarged prostate with lower urinary tract symptoms: Secondary | ICD-10-CM | POA: Diagnosis not present

## 2023-05-04 DIAGNOSIS — B182 Chronic viral hepatitis C: Secondary | ICD-10-CM | POA: Diagnosis not present

## 2023-05-04 DIAGNOSIS — M48061 Spinal stenosis, lumbar region without neurogenic claudication: Secondary | ICD-10-CM | POA: Diagnosis not present

## 2023-05-04 DIAGNOSIS — J45909 Unspecified asthma, uncomplicated: Secondary | ICD-10-CM | POA: Diagnosis not present

## 2023-05-04 DIAGNOSIS — E669 Obesity, unspecified: Secondary | ICD-10-CM | POA: Diagnosis not present

## 2023-05-08 DIAGNOSIS — M4646 Discitis, unspecified, lumbar region: Secondary | ICD-10-CM | POA: Diagnosis not present

## 2023-05-08 DIAGNOSIS — N39498 Other specified urinary incontinence: Secondary | ICD-10-CM | POA: Diagnosis not present

## 2023-05-08 DIAGNOSIS — D696 Thrombocytopenia, unspecified: Secondary | ICD-10-CM | POA: Diagnosis not present

## 2023-05-08 DIAGNOSIS — I10 Essential (primary) hypertension: Secondary | ICD-10-CM | POA: Diagnosis not present

## 2023-05-08 DIAGNOSIS — J45909 Unspecified asthma, uncomplicated: Secondary | ICD-10-CM | POA: Diagnosis not present

## 2023-05-08 DIAGNOSIS — M48061 Spinal stenosis, lumbar region without neurogenic claudication: Secondary | ICD-10-CM | POA: Diagnosis not present

## 2023-05-08 DIAGNOSIS — B182 Chronic viral hepatitis C: Secondary | ICD-10-CM | POA: Diagnosis not present

## 2023-05-08 DIAGNOSIS — N401 Enlarged prostate with lower urinary tract symptoms: Secondary | ICD-10-CM | POA: Diagnosis not present

## 2023-05-08 DIAGNOSIS — E669 Obesity, unspecified: Secondary | ICD-10-CM | POA: Diagnosis not present

## 2023-05-16 DIAGNOSIS — D696 Thrombocytopenia, unspecified: Secondary | ICD-10-CM | POA: Diagnosis not present

## 2023-05-16 DIAGNOSIS — B182 Chronic viral hepatitis C: Secondary | ICD-10-CM | POA: Diagnosis not present

## 2023-05-16 DIAGNOSIS — M48061 Spinal stenosis, lumbar region without neurogenic claudication: Secondary | ICD-10-CM | POA: Diagnosis not present

## 2023-05-16 DIAGNOSIS — E669 Obesity, unspecified: Secondary | ICD-10-CM | POA: Diagnosis not present

## 2023-05-16 DIAGNOSIS — N39498 Other specified urinary incontinence: Secondary | ICD-10-CM | POA: Diagnosis not present

## 2023-05-16 DIAGNOSIS — N401 Enlarged prostate with lower urinary tract symptoms: Secondary | ICD-10-CM | POA: Diagnosis not present

## 2023-05-16 DIAGNOSIS — J45909 Unspecified asthma, uncomplicated: Secondary | ICD-10-CM | POA: Diagnosis not present

## 2023-05-16 DIAGNOSIS — I10 Essential (primary) hypertension: Secondary | ICD-10-CM | POA: Diagnosis not present

## 2023-05-16 DIAGNOSIS — M4646 Discitis, unspecified, lumbar region: Secondary | ICD-10-CM | POA: Diagnosis not present

## 2023-08-01 DIAGNOSIS — M25511 Pain in right shoulder: Secondary | ICD-10-CM | POA: Diagnosis not present

## 2023-08-01 DIAGNOSIS — M25512 Pain in left shoulder: Secondary | ICD-10-CM | POA: Diagnosis not present

## 2023-10-31 DIAGNOSIS — M25512 Pain in left shoulder: Secondary | ICD-10-CM | POA: Diagnosis not present

## 2023-10-31 DIAGNOSIS — M25511 Pain in right shoulder: Secondary | ICD-10-CM | POA: Diagnosis not present

## 2024-01-08 ENCOUNTER — Emergency Department (HOSPITAL_COMMUNITY)

## 2024-01-08 ENCOUNTER — Emergency Department (HOSPITAL_COMMUNITY)
Admission: EM | Admit: 2024-01-08 | Discharge: 2024-01-09 | Disposition: A | Discharge status: Home / Self Care | Attending: Emergency Medicine | Admitting: Emergency Medicine

## 2024-01-08 DIAGNOSIS — I10 Essential (primary) hypertension: Secondary | ICD-10-CM | POA: Diagnosis not present

## 2024-01-08 DIAGNOSIS — Z7901 Long term (current) use of anticoagulants: Secondary | ICD-10-CM | POA: Diagnosis not present

## 2024-01-08 DIAGNOSIS — J90 Pleural effusion, not elsewhere classified: Secondary | ICD-10-CM | POA: Diagnosis not present

## 2024-01-08 DIAGNOSIS — R918 Other nonspecific abnormal finding of lung field: Secondary | ICD-10-CM | POA: Diagnosis not present

## 2024-01-08 DIAGNOSIS — T40601A Poisoning by unspecified narcotics, accidental (unintentional), initial encounter: Secondary | ICD-10-CM

## 2024-01-08 DIAGNOSIS — F1010 Alcohol abuse, uncomplicated: Secondary | ICD-10-CM | POA: Diagnosis not present

## 2024-01-08 DIAGNOSIS — Z79899 Other long term (current) drug therapy: Secondary | ICD-10-CM | POA: Insufficient documentation

## 2024-01-08 DIAGNOSIS — T402X1A Poisoning by other opioids, accidental (unintentional), initial encounter: Secondary | ICD-10-CM | POA: Insufficient documentation

## 2024-01-08 DIAGNOSIS — I517 Cardiomegaly: Secondary | ICD-10-CM | POA: Diagnosis not present

## 2024-01-08 DIAGNOSIS — J45909 Unspecified asthma, uncomplicated: Secondary | ICD-10-CM | POA: Insufficient documentation

## 2024-01-08 DIAGNOSIS — R531 Weakness: Secondary | ICD-10-CM | POA: Diagnosis not present

## 2024-01-08 DIAGNOSIS — R55 Syncope and collapse: Secondary | ICD-10-CM | POA: Diagnosis present

## 2024-01-08 LAB — CBC WITH DIFFERENTIAL/PLATELET
Abs Immature Granulocytes: 0.01 10*3/uL (ref 0.00–0.07)
Basophils Absolute: 0 10*3/uL (ref 0.0–0.1)
Basophils Relative: 1 %
Eosinophils Absolute: 0.2 10*3/uL (ref 0.0–0.5)
Eosinophils Relative: 4 %
HCT: 45.5 % (ref 39.0–52.0)
Hemoglobin: 15 g/dL (ref 13.0–17.0)
Immature Granulocytes: 0 %
Lymphocytes Relative: 32 %
Lymphs Abs: 1.3 10*3/uL (ref 0.7–4.0)
MCH: 31 pg (ref 26.0–34.0)
MCHC: 33 g/dL (ref 30.0–36.0)
MCV: 94 fL (ref 80.0–100.0)
Monocytes Absolute: 0.5 10*3/uL (ref 0.1–1.0)
Monocytes Relative: 13 %
Neutro Abs: 2.1 10*3/uL (ref 1.7–7.7)
Neutrophils Relative %: 50 %
Platelets: 117 10*3/uL — ABNORMAL LOW (ref 150–400)
RBC: 4.84 MIL/uL (ref 4.22–5.81)
RDW: 13 % (ref 11.5–15.5)
WBC: 4.1 10*3/uL (ref 4.0–10.5)
nRBC: 0 % (ref 0.0–0.2)

## 2024-01-08 LAB — COMPREHENSIVE METABOLIC PANEL WITH GFR
ALT: 10 U/L (ref 0–44)
AST: 19 U/L (ref 15–41)
Albumin: 3.2 g/dL — ABNORMAL LOW (ref 3.5–5.0)
Alkaline Phosphatase: 31 U/L — ABNORMAL LOW (ref 38–126)
Anion gap: 8 (ref 5–15)
BUN: 13 mg/dL (ref 8–23)
CO2: 27 mmol/L (ref 22–32)
Calcium: 9 mg/dL (ref 8.9–10.3)
Chloride: 102 mmol/L (ref 98–111)
Creatinine, Ser: 1.18 mg/dL (ref 0.61–1.24)
GFR, Estimated: >60 mL/min (ref >60)
Glucose, Bld: 105 mg/dL — ABNORMAL HIGH (ref 70–99)
Potassium: 3.7 mmol/L (ref 3.5–5.1)
Sodium: 137 mmol/L (ref 135–145)
Total Bilirubin: 0.7 mg/dL (ref 0.0–1.2)
Total Protein: 7.4 g/dL (ref 6.5–8.1)

## 2024-01-08 MED ORDER — NALOXONE HCL 4 MG/0.1ML NA LIQD: 1.0000 | Freq: Once | NASAL | 0 refills | Status: DC | PRN

## 2024-01-08 NOTE — ED Triage Notes (Addendum)
 PT BIB [facility] after pt took oxycodone due to chronic back pain due surgery. PT also had etoch tonight. Family found pt down with shallow breathing, fire had to bag pt and give him 4 nasal narcan. PT was down for about before fire got there. First BP was 94/58 , BG 103. PT don't walk at baseline he use a motor chair to get around. On arrival pt is awake and talking GCS 15. Pt denies SI.

## 2024-01-08 NOTE — ED Provider Notes (Signed)
 11:13 PM Assumed care from Dr. Suezanne Jacquet, please see their note for full history, physical and decision making until this point. In brief this is a 73 y.o. year old male who presented to the ED tonight with Drug Overdose and Alcohol Intoxication     Etoh and oxycodone - syncope and apneic. Received narcan. Better afterwards. Baseline now. 1.5 hours observation for narcan.  Reevaluated at 0130 and awake, speaking clearly, doesn't know why he got sent here. Not slurring. Vitals normal and stable for his visit here. Felt to be clinically sober and has had multiple half lifes of narcan without change in resp status. Rx for narcan already given. Stable for d/c.   Discharge instructions, including strict return precautions for new or worsening symptoms, given. Patient and/or family verbalized understanding and agreement with the plan as described.   Labs, studies and imaging reviewed by myself and considered in medical decision making if ordered. Imaging interpreted by radiology.  Labs Reviewed  COMPREHENSIVE METABOLIC PANEL WITH GFR - Abnormal; Notable for the following components:      Result Value   Glucose, Bld 105 (*)    Albumin 3.2 (*)    Alkaline Phosphatase 31 (*)    All other components within normal limits  CBC WITH DIFFERENTIAL/PLATELET - Abnormal; Notable for the following components:   Platelets 117 (*)    All other components within normal limits    DG Chest Port 1 View    (Results Pending)    No follow-ups on file.    Marily Memos, MD 01/09/24 [phone]

## 2024-01-08 NOTE — ED Provider Notes (Signed)
 [facility] EMERGENCY DEPARTMENT AT [facility] Provider Note  CSN: [id number] Arrival date & time: 01/08/24 2031  Chief Complaint(s) Drug Overdose and Alcohol Intoxication  HPI Evan Morgan is a 73 y.o. male history of chronic back pain, on chronic oxycodone presenting to the emergency department with episode of loss of consciousness.  Patient reports that he was drinking alcohol tonight.  Took his dose of oxycodone, not sure if he took 10 or 5 mg, denies any illicit use or buying off the street.  He reports that he felt very sleepy and does not really remember anything else until the paramedics woke him up.  Paramedics reportedly gave him 4 mg of intranasal Narcan with improvement in mental status and return to spontaneous respirations.  They did have to initially bagged patient.  Patient currently denies any complaints.  Denies any headaches, nausea or vomiting, chest pain, palpitations, leg pain or swelling, fevers or chills.  Reports chronic unchanged back pain.  No other new symptoms.   Past Medical History Past Medical History:  Diagnosis Date   Arthritis    knees   Asthma    As a child   History of hepatitis C    Hypertension    Patient Active Problem List   Diagnosis Date Noted   Acute DVT (deep venous thrombosis) (HCC) 11/16/2022   Arm DVT (deep venous thromboembolism), acute, right (HCC) 11/15/2022   Asthma, chronic 11/15/2022   Hypokalemia 11/15/2022   Staphylococcus infection 11/03/2022   Hepatitis C antibody positive in blood 11/02/2022   Vertebral osteomyelitis (HCC) 10/31/2022   Urinary incontinence 10/31/2022   Hardware complicating wound infection (HCC) 10/31/2022   Discitis 10/30/2022   Discitis of lumbar region 10/30/2022   Post-op pain 05/13/2022   Intractable low back pain 05/13/2022   Radiculopathy 07/23/2019   Abnormal gait 09/03/2017   Bilateral leg numbness 09/03/2017   Muscle spasm 11/07/2016   BPH (benign prostatic hyperplasia) 08/30/2016    GERD (gastroesophageal reflux disease) 08/30/2016   HTN (hypertension) 08/30/2016   S/P lumbar fusion 08/27/2015   DDD (degenerative disc disease), lumbar 04/01/2015   Foraminal stenosis of lumbar region 04/01/2015   Spinal fusion failure 04/01/2015   Midline low back pain with sciatica 12/16/2014   Home Medication(s) Prior to Admission medications   Medication Sig Start Date End Date Taking? Authorizing Provider  naloxone [facility]) nasal spray 4 mg/0.1 mL Place 1 spray into the nose once as needed for up to 1 dose (overdose). 01/08/24  Yes Lonell Grandchild, MD  acetaminophen (TYLENOL) 500 MG tablet Take 1 tablet (500 mg total) by mouth every 6 (six) hours as needed for moderate pain. Patient taking differently: Take 1,000 mg by mouth every 8 (eight) hours as needed for moderate pain. 11/03/22   Lewie Chamber, MD  APIXABAN Everlene Balls) VTE STARTER PACK (10MG  AND 5MG ) Take as directed on package: start with two-5mg  tablets twice daily for 7 days. On day 8, switch to one-5mg  tablet twice daily. Continue for 3 months 11/22/22   Pokhrel, Rebekah Chesterfield, MD  atenolol (TENORMIN) 50 MG tablet Take 50 mg by mouth daily.     [provider]  Cholecalciferol 50 MCG (2000 UT) TABS Take 2,000 Units by mouth daily.     [provider]  DULCOLAX 10 MG suppository Place 10 mg rectally daily as needed for constipation. 05/10/22   [provider]  finasteride (PROSCAR) 5 MG tablet Take 5 mg by mouth daily.    [provider]  gabapentin (NEURONTIN)  300 MG capsule Take 900 mg by mouth at bedtime.    [provider]  hydrALAZINE (APRESOLINE) 10 MG tablet Take 10 mg by mouth daily.    [provider]  hydrochlorothiazide (HYDRODIURIL) 25 MG tablet Take 25 mg by mouth daily.     [provider]  magnesium hydroxide (MILK OF MAGNESIA) 400 MG/5ML suspension Take 30 mLs by mouth daily as needed for mild constipation.    [provider]  methocarbamol  (ROBAXIN) 500 MG tablet Take 1-2 tablets (500-1,000 mg total) by mouth every 6 (six) hours as needed for muscle spasms. 05/24/22   McKenzie, Eilene Ghazi, PA-C  nystatin-triamcinolone ointment (MYCOLOG) Apply 1 Application topically daily as needed for dry skin. 05/10/22   [provider]  ondansetron (ZOFRAN) 4 MG tablet Take 1 tablet (4 mg total) by mouth every 6 (six) hours as needed for nausea. 05/19/22   McKenzie, Eilene Ghazi, PA-C  oxyCODONE-acetaminophen (PERCOCET/ROXICET) 5-325 MG tablet Take 1 tablet by mouth every 6 (six) hours as needed for moderate pain or severe pain. 11/03/22   Lewie Chamber, MD  SENNA LAXATIVE 8.6 MG tablet Take 1 tablet by mouth 2 (two) times daily. Hold for loose stools. 05/10/22   [provider]  tamsulosin (FLOMAX) 0.4 MG CAPS capsule Take 0.8 mg by mouth daily.    [provider]                                                                                                                                    Past Surgical History Past Surgical History:  Procedure Laterality Date   ALLOGRAFT APPLICATION Left 04/13/2022   Procedure: ALLOGRAFT APPLICATION;  Surgeon: Estill Bamberg, MD;  [facility];  Service: Orthopedics;  Laterality: Left;   ANTERIOR LAT LUMBAR FUSION  07/23/2019   Procedure: LUMBAR TWO-THREE LEFT LATERAL INTERBODY FUSION WITH INSTRUMENTATION AND ALLOGRAFT;  Surgeon: Estill Bamberg, MD;  Location: [facility] OR;  Service: Orthopedics;;   BACK SURGERY     x3   TRANSFORAMINAL LUMBAR INTERBODY FUSION (TLIF) WITH PEDICLE SCREW FIXATION 1 LEVEL Left 04/13/2022   Procedure: HARDWARE REMOVAL  LEFT-SIDED LUMBAR 4 - LUMBAR 5 TRANSFORAMINAL LUMBAR INTERBODY FUSION AND DECOMPRESSION WITH INSTRUMENTATION;  Surgeon: Estill Bamberg, MD;  Location: [facility] OR;  Service: Orthopedics;  Laterality: Left;   Family History Family History  Problem Relation Age of Onset   Colon cancer Neg Hx    Esophageal cancer Neg Hx    Rectal cancer Neg Hx    Stomach cancer  Neg Hx     Social History Social History   Tobacco Use   Smoking status: Never   Smokeless tobacco: Never  Vaping Use   Vaping status: Never Used  Substance Use Topics   Alcohol use: Not Currently    Comment: maybe a fifth per week, not all of the time   Drug use: Yes    Frequency: 4.0 times per week  Types: Marijuana    Comment: 3-4 times per week   Allergies Amlodipine, Codeine, and Lisinopril  Review of Systems Review of Systems  All other systems reviewed and are negative.   Physical Exam Vital Signs  I have reviewed the triage vital signs BP 109/72   Pulse (!) 53   Temp 97.8 F (36.6 C)   Resp 14   SpO2 100%  Physical Exam Vitals and nursing note reviewed.  Constitutional:      General: He is not in acute distress.    Appearance: Normal appearance.  HENT:     Mouth/Throat:     Mouth: Mucous membranes are moist.  Eyes:     Conjunctiva/sclera: Conjunctivae normal.  Cardiovascular:     Rate and Rhythm: Regular rhythm. Bradycardia present.  Pulmonary:     Effort: Pulmonary effort is normal. No respiratory distress.     Breath sounds: Normal breath sounds.  Abdominal:     General: Abdomen is flat.     Palpations: Abdomen is soft.     Tenderness: There is no abdominal tenderness.  Musculoskeletal:     Right lower leg: No edema.     Left lower leg: No edema.  Skin:    General: Skin is warm and dry.     Capillary Refill: Capillary refill takes less than 2 seconds.  Neurological:     Mental Status: He is alert and oriented to person, place, and time. Mental status is at baseline.  Psychiatric:        Mood and Affect: Mood normal.        Behavior: Behavior normal.     ED Results and Treatments Labs (all labs ordered are listed, but only abnormal results are displayed) Labs Reviewed  COMPREHENSIVE METABOLIC PANEL WITH GFR - Abnormal; Notable for the following components:      Result Value   Glucose, Bld 105 (*)    Albumin 3.2 (*)    Alkaline  Phosphatase 31 (*)    All other components within normal limits  CBC WITH DIFFERENTIAL/PLATELET - Abnormal; Notable for the following components:   Platelets 117 (*)    All other components within normal limits                                                                                                                          Radiology No results found.  Pertinent labs & imaging results that were available during my care of the patient were reviewed by me and considered in my medical decision making (see MDM for details).  Medications Ordered in ED Medications - No data to display  Procedures Procedures  (including critical care time)  Medical Decision Making / ED Course   MDM:  73 year old presenting to the emergency department with possible overdose.  Patient endorses mixing alcohol and oxycodone.  He was then found down by family, found to have shallow breathing with paramedics, and received Narcan with improvement.  Presentation seems consistent with unintentional opioid overdose.  Will monitor for around 3 hours.  If patient remains stable, should be stable for discharge we will check some basic labs and chest x-ray.  Patient denies any SI.  Seems less consistent with syncopal event given that he responded to the Narcan he denies any medical complaints.  He also reports feeling sleepy.  No signs of trauma or complaint of pain, no signs of injury.  Clinical Course as of 01/08/24 2329  Tue Jan 08, 2024  2326 Signed out to Dr. Clayborne Dana pending observation for unintentional opioid overdose.  [WS]    Clinical Course User Index [WS] Lonell Grandchild, MD     Additional history obtained: -Additional history obtained from ems -External records from outside source obtained and reviewed including: Chart review including previous notes, labs, imaging,  consultation notes including prior hospital notes    Lab Tests: -I ordered, reviewed, and interpreted labs.   The pertinent results include:   Labs Reviewed  COMPREHENSIVE METABOLIC PANEL WITH GFR - Abnormal; Notable for the following components:      Result Value   Glucose, Bld 105 (*)    Albumin 3.2 (*)    Alkaline Phosphatase 31 (*)    All other components within normal limits  CBC WITH DIFFERENTIAL/PLATELET - Abnormal; Notable for the following components:   Platelets 117 (*)    All other components within normal limits    Notable for nonspecific mild thrombocytopenia and hyperglycemia   EKG   EKG Interpretation Date/Time:  Tuesday January 08 2024 20:40:55 EDT Ventricular Rate:  55 PR Interval:  161 QRS Duration:  82 QT Interval:  468 QTC Calculation: 448 R Axis:   33  Text Interpretation: Sinus rhythm Confirmed by Alvino Blood ([id number]) on 01/08/2024 10:42:39 PM         Imaging Studies ordered: I ordered imaging studies including CXR On my interpretation imaging demonstrates normal findings  I independently visualized and interpreted imaging. I agree with the radiologist interpretation   Medicines ordered and prescription drug management: Meds ordered this encounter  Medications   naloxone (NARCAN) nasal spray 4 mg/0.1 mL    Sig: Place 1 spray into the nose once as needed for up to 1 dose (overdose).    Dispense:  2 each    Refill:  0    -I have reviewed the patients home medicines and have made adjustments as needed   Social Determinants of Health:  Diagnosis or treatment significantly limited by social determinants of health: alcohol use   Reevaluation: After the interventions noted above, I reevaluated the patient and found that their symptoms have improved  Co morbidities that complicate the patient evaluation  Past Medical History:  Diagnosis Date   Arthritis    knees   Asthma    As a child   History of hepatitis C    Hypertension        Dispostion: Disposition decision including need for hospitalization was considered, and patient disposition pending at time of sign out.    Final Clinical Impression(s) / ED Diagnoses Final diagnoses:  Accidental opiate poisoning, initial encounter [facility])     This chart was dictated  using voice recognition software.  Despite best efforts to proofread,  errors can occur which can change the documentation meaning.    Lonell Grandchild, MD 01/08/24 2329

## 2024-01-08 NOTE — Discharge Instructions (Signed)
 Please do not mix your opioid medication (oxycodone) with alcohol.  It can cause an overdose.

## 2024-01-09 MED ORDER — NALOXONE HCL 4 MG/0.1ML NA LIQD: 1.0000 | Freq: Once | NASAL | 0 refills | Status: AC | PRN

## 2024-01-30 DIAGNOSIS — M25511 Pain in right shoulder: Secondary | ICD-10-CM | POA: Diagnosis not present

## 2024-01-30 DIAGNOSIS — M25512 Pain in left shoulder: Secondary | ICD-10-CM | POA: Diagnosis not present

## 2024-02-22 DIAGNOSIS — I499 Cardiac arrhythmia, unspecified: Secondary | ICD-10-CM | POA: Diagnosis not present

## 2024-02-22 DIAGNOSIS — R404 Transient alteration of awareness: Secondary | ICD-10-CM | POA: Diagnosis not present

## 2024-02-22 DIAGNOSIS — T50904A Poisoning by unspecified drugs, medicaments and biological substances, undetermined, initial encounter: Secondary | ICD-10-CM | POA: Diagnosis not present

## 2024-02-22 DIAGNOSIS — R55 Syncope and collapse: Secondary | ICD-10-CM | POA: Diagnosis not present

## 2024-03-02 DIAGNOSIS — 419620001 Death: Secondary | SNOMED CT | POA: Diagnosis not present

## 2024-03-02 DEATH — deceased
